# Patient Record
Sex: Female | Born: 1955 | Race: White | Hispanic: No | Marital: Married | Smoking: Never smoker
Health system: Southern US, Community
[De-identification: ages and names within clinical notes are randomized; demographics above are authoritative.]

## PROBLEM LIST (undated history)

## (undated) DIAGNOSIS — C50919 Malignant neoplasm of unspecified site of unspecified female breast: Secondary | ICD-10-CM

## (undated) DIAGNOSIS — I10 Essential (primary) hypertension: Secondary | ICD-10-CM

## (undated) DIAGNOSIS — I499 Cardiac arrhythmia, unspecified: Secondary | ICD-10-CM

## (undated) DIAGNOSIS — D649 Anemia, unspecified: Secondary | ICD-10-CM

## (undated) DIAGNOSIS — Z8742 Personal history of other diseases of the female genital tract: Secondary | ICD-10-CM

## (undated) DIAGNOSIS — K219 Gastro-esophageal reflux disease without esophagitis: Secondary | ICD-10-CM

## (undated) DIAGNOSIS — Z923 Personal history of irradiation: Secondary | ICD-10-CM

## (undated) DIAGNOSIS — F329 Major depressive disorder, single episode, unspecified: Secondary | ICD-10-CM

## (undated) DIAGNOSIS — F32A Depression, unspecified: Secondary | ICD-10-CM

## (undated) DIAGNOSIS — N289 Disorder of kidney and ureter, unspecified: Secondary | ICD-10-CM

## (undated) DIAGNOSIS — R7303 Prediabetes: Secondary | ICD-10-CM

## (undated) DIAGNOSIS — G4730 Sleep apnea, unspecified: Secondary | ICD-10-CM

## (undated) HISTORY — DX: Major depressive disorder, single episode, unspecified: F32.9

## (undated) HISTORY — PX: DIAGNOSTIC LAPAROSCOPY: SUR761

## (undated) HISTORY — PX: TUBAL LIGATION: SHX77

## (undated) HISTORY — DX: Disorder of kidney and ureter, unspecified: N28.9

## (undated) HISTORY — PX: ABDOMINAL HYSTERECTOMY: SHX81

## (undated) HISTORY — DX: Malignant neoplasm of unspecified site of unspecified female breast: C50.919

## (undated) HISTORY — DX: Depression, unspecified: F32.A

---

## 1997-10-18 ENCOUNTER — Other Ambulatory Visit: Admission: RE | Admit: 1997-10-18 | Discharge: 1997-10-18 | Payer: Self-pay | Admitting: Obstetrics and Gynecology

## 1997-11-10 ENCOUNTER — Ambulatory Visit (HOSPITAL_COMMUNITY): Admission: RE | Admit: 1997-11-10 | Discharge: 1997-11-10 | Payer: Self-pay | Admitting: Obstetrics and Gynecology

## 1997-11-16 ENCOUNTER — Ambulatory Visit (HOSPITAL_COMMUNITY): Admission: RE | Admit: 1997-11-16 | Discharge: 1997-11-16 | Payer: Self-pay | Admitting: Obstetrics and Gynecology

## 1998-10-30 ENCOUNTER — Other Ambulatory Visit: Admission: RE | Admit: 1998-10-30 | Discharge: 1998-10-30 | Payer: Self-pay | Admitting: Obstetrics and Gynecology

## 1999-02-28 ENCOUNTER — Encounter (INDEPENDENT_AMBULATORY_CARE_PROVIDER_SITE_OTHER): Payer: Self-pay | Admitting: Specialist

## 1999-02-28 ENCOUNTER — Other Ambulatory Visit: Admission: RE | Admit: 1999-02-28 | Discharge: 1999-02-28 | Payer: Self-pay | Admitting: Obstetrics and Gynecology

## 1999-12-27 ENCOUNTER — Other Ambulatory Visit: Admission: RE | Admit: 1999-12-27 | Discharge: 1999-12-27 | Payer: Self-pay | Admitting: Obstetrics and Gynecology

## 2001-04-06 ENCOUNTER — Ambulatory Visit (HOSPITAL_COMMUNITY): Admission: RE | Admit: 2001-04-06 | Discharge: 2001-04-06 | Payer: Self-pay | Admitting: Obstetrics and Gynecology

## 2001-04-06 ENCOUNTER — Encounter: Payer: Self-pay | Admitting: Obstetrics and Gynecology

## 2003-07-13 ENCOUNTER — Other Ambulatory Visit: Admission: RE | Admit: 2003-07-13 | Discharge: 2003-07-13 | Payer: Self-pay | Admitting: Obstetrics and Gynecology

## 2004-07-22 ENCOUNTER — Other Ambulatory Visit: Admission: RE | Admit: 2004-07-22 | Discharge: 2004-07-22 | Payer: Self-pay | Admitting: Obstetrics and Gynecology

## 2005-07-28 ENCOUNTER — Other Ambulatory Visit: Admission: RE | Admit: 2005-07-28 | Discharge: 2005-07-28 | Payer: Self-pay | Admitting: Obstetrics and Gynecology

## 2006-08-28 ENCOUNTER — Other Ambulatory Visit: Admission: RE | Admit: 2006-08-28 | Discharge: 2006-08-28 | Payer: Self-pay | Admitting: Obstetrics and Gynecology

## 2007-12-07 ENCOUNTER — Other Ambulatory Visit: Admission: RE | Admit: 2007-12-07 | Discharge: 2007-12-07 | Payer: Self-pay | Admitting: Obstetrics and Gynecology

## 2008-05-10 ENCOUNTER — Ambulatory Visit: Payer: Self-pay | Admitting: Unknown Physician Specialty

## 2008-05-10 LAB — HM COLONOSCOPY

## 2008-12-27 ENCOUNTER — Other Ambulatory Visit: Admission: RE | Admit: 2008-12-27 | Discharge: 2008-12-27 | Payer: Self-pay | Admitting: Obstetrics and Gynecology

## 2009-06-30 HISTORY — PX: DILATION AND CURETTAGE OF UTERUS: SHX78

## 2009-10-22 ENCOUNTER — Ambulatory Visit (HOSPITAL_COMMUNITY): Admission: RE | Admit: 2009-10-22 | Discharge: 2009-10-22 | Payer: Self-pay | Admitting: Obstetrics and Gynecology

## 2010-01-21 LAB — HM PAP SMEAR

## 2010-06-27 ENCOUNTER — Ambulatory Visit: Payer: Self-pay | Admitting: Cardiology

## 2010-09-17 LAB — CBC
HCT: 33.2 % — ABNORMAL LOW (ref 36.0–46.0)
Hemoglobin: 11.1 g/dL — ABNORMAL LOW (ref 12.0–15.0)
MCHC: 33.6 g/dL (ref 30.0–36.0)
MCV: 84.2 fL (ref 78.0–100.0)
Platelets: 273 10*3/uL (ref 150–400)
RBC: 3.94 MIL/uL (ref 3.87–5.11)
RDW: 15.6 % — ABNORMAL HIGH (ref 11.5–15.5)
WBC: 6.9 10*3/uL (ref 4.0–10.5)

## 2011-02-11 ENCOUNTER — Encounter (HOSPITAL_COMMUNITY)
Admission: RE | Admit: 2011-02-11 | Discharge: 2011-02-11 | Disposition: A | Discharge status: Home / Self Care | Payer: BC Managed Care – PPO | Source: Ambulatory Visit | Attending: Obstetrics & Gynecology | Admitting: Obstetrics & Gynecology

## 2011-02-11 ENCOUNTER — Encounter (HOSPITAL_COMMUNITY): Payer: Self-pay

## 2011-02-11 HISTORY — DX: Cardiac arrhythmia, unspecified: I49.9

## 2011-02-11 HISTORY — DX: Anemia, unspecified: D64.9

## 2011-02-11 HISTORY — DX: Gastro-esophageal reflux disease without esophagitis: K21.9

## 2011-02-11 HISTORY — DX: Sleep apnea, unspecified: G47.30

## 2011-02-11 LAB — SURGICAL PCR SCREEN
MRSA, PCR: NEGATIVE
Staphylococcus aureus: NEGATIVE

## 2011-02-11 LAB — CBC
HCT: 35.2 % — ABNORMAL LOW (ref 36.0–46.0)
Hemoglobin: 11.5 g/dL — ABNORMAL LOW (ref 12.0–15.0)
MCH: 28.1 pg (ref 26.0–34.0)
MCHC: 32.7 g/dL (ref 30.0–36.0)
MCV: 86.1 fL (ref 78.0–100.0)
Platelets: 236 10*3/uL (ref 150–400)
RBC: 4.09 MIL/uL (ref 3.87–5.11)
RDW: 14.7 % (ref 11.5–15.5)
WBC: 7.2 10*3/uL (ref 4.0–10.5)

## 2011-02-11 NOTE — Patient Instructions (Signed)
20 Kathy Farmer  02/11/2011   Your procedure is scheduled on:  02/17/11  Report to [facility] at 0600 AM.desk phone [phone]  Call this number if you have problems the morning of surgery: [phone]   Remember:   Do not eat food:After Midnight.  Do not drink clear liquids: After Midnight.  Take these medicines the morning of surgery with A SIP OF WATER: antiacid med   Do not wear jewelry, make-up or nail polish.  Do not wear lotions, powders, or perfumes. You may wear deodorant.  Do not shave 48 hours prior to surgery.  Do not bring valuables to the hospital.  Contacts, dentures or bridgework may not be worn into surgery.  Leave suitcase in the car. After surgery it may be brought to your room.  For patients admitted to the hospital, checkout time is 11:00 AM the day of discharge.   Patients discharged the day of surgery will not be allowed to drive home.  Name and phone number of your driver: Theron Arista [phone]  Special Instructions: CHG Shower Use Special Wash: 1/2 bottle night before surgery and 1/2 bottle morning of surgery.   Please read over the following fact sheets that you were given: Care and Recovery After Surgery

## 2011-02-11 NOTE — Anesthesia Preprocedure Evaluation (Signed)
Anesthesia Evaluation  Name, MR# and DOB Patient awake  General Assessment Comment  Reviewed: Allergy & Precautions, H&P , Patient's Chart, lab work & pertinent test results  Airway  TM Distance: >3 FB Neck ROM: Full    Dental  (+) Teeth Intact   Pulmonary  clear to auscultation  pulmonary exam normalPulmonary Exam Normal breath sounds clear to auscultation none    Cardiovascular     Neuro/Psych Negative Neurological ROS  Negative Psych ROS  GI/Hepatic/Renal negative GI ROS, negative Liver ROS, and negative Renal ROS (+)  GERD Controlled and Medicated     Endo/Other  Negative Endocrine ROS (+)      Abdominal Normal abdominal exam  (+)   Musculoskeletal negative musculoskeletal ROS (+)   Hematology negative hematology ROS (+)   Peds  Reproductive/Obstetrics negative OB ROS    Anesthesia Other Findings             Anesthesia Physical Anesthesia Plan  ASA: II  Anesthesia Plan: General   Post-op Pain Management:    Induction: Intravenous  Airway Management Planned: Oral ETT  Additional Equipment:   Intra-op Plan:   Post-operative Plan: Extubation in OR  Informed Consent: I have reviewed the patients History and Physical, chart, labs and discussed the procedure including the risks, benefits and alternatives for the proposed anesthesia with the patient or authorized representative who has indicated his/her understanding and acceptance.   Dental advisory given  Plan Discussed with:   Anesthesia Plan Comments:         Anesthesia Quick Evaluation

## 2011-02-14 DIAGNOSIS — D219 Benign neoplasm of connective and other soft tissue, unspecified: Secondary | ICD-10-CM | POA: Diagnosis present

## 2011-02-14 DIAGNOSIS — N920 Excessive and frequent menstruation with regular cycle: Secondary | ICD-10-CM | POA: Diagnosis present

## 2011-02-14 DIAGNOSIS — N800 Endometriosis of uterus: Secondary | ICD-10-CM

## 2011-02-16 MED ORDER — CEFAZOLIN SODIUM-DEXTROSE 2-3 GM-% IV SOLR
2.0000 g | INTRAVENOUS | Status: DC
  Filled 2011-02-16: qty 50

## 2011-02-17 ENCOUNTER — Encounter (HOSPITAL_COMMUNITY)
Admission: RE | Disposition: A | Discharge status: Home / Self Care | Payer: Self-pay | Source: Ambulatory Visit | Attending: Obstetrics & Gynecology

## 2011-02-17 ENCOUNTER — Other Ambulatory Visit: Payer: Self-pay | Admitting: Obstetrics & Gynecology

## 2011-02-17 ENCOUNTER — Encounter (HOSPITAL_COMMUNITY): Payer: Self-pay | Admitting: Anesthesiology

## 2011-02-17 ENCOUNTER — Ambulatory Visit (HOSPITAL_COMMUNITY)
Admission: RE | Admit: 2011-02-17 | Discharge: 2011-02-17 | Disposition: A | Discharge status: Home / Self Care | Payer: BC Managed Care – PPO | Source: Ambulatory Visit | Attending: Obstetrics & Gynecology | Admitting: Obstetrics & Gynecology

## 2011-02-17 ENCOUNTER — Ambulatory Visit (HOSPITAL_COMMUNITY): Payer: BC Managed Care – PPO | Admitting: Anesthesiology

## 2011-02-17 DIAGNOSIS — D649 Anemia, unspecified: Secondary | ICD-10-CM | POA: Insufficient documentation

## 2011-02-17 DIAGNOSIS — N8000 Endometriosis of the uterus, unspecified: Secondary | ICD-10-CM | POA: Insufficient documentation

## 2011-02-17 DIAGNOSIS — Z01812 Encounter for preprocedural laboratory examination: Secondary | ICD-10-CM | POA: Insufficient documentation

## 2011-02-17 DIAGNOSIS — N920 Excessive and frequent menstruation with regular cycle: Secondary | ICD-10-CM | POA: Diagnosis present

## 2011-02-17 DIAGNOSIS — D250 Submucous leiomyoma of uterus: Secondary | ICD-10-CM | POA: Insufficient documentation

## 2011-02-17 DIAGNOSIS — Z01818 Encounter for other preprocedural examination: Secondary | ICD-10-CM | POA: Insufficient documentation

## 2011-02-17 DIAGNOSIS — D219 Benign neoplasm of connective and other soft tissue, unspecified: Secondary | ICD-10-CM | POA: Diagnosis present

## 2011-02-17 LAB — PREGNANCY, URINE: Preg Test, Ur: NEGATIVE

## 2011-02-17 LAB — CBC
HCT: 35.8 % — ABNORMAL LOW (ref 36.0–46.0)
Hemoglobin: 11.8 g/dL — ABNORMAL LOW (ref 12.0–15.0)
MCH: 28.3 pg (ref 26.0–34.0)
MCHC: 33 g/dL (ref 30.0–36.0)
MCV: 85.9 fL (ref 78.0–100.0)
Platelets: 232 10*3/uL (ref 150–400)
RBC: 4.17 MIL/uL (ref 3.87–5.11)
RDW: 14.6 % (ref 11.5–15.5)
WBC: 11.6 10*3/uL — ABNORMAL HIGH (ref 4.0–10.5)

## 2011-02-17 SURGERY — ROBOTIC ASSISTED TOTAL HYSTERECTOMY
Anesthesia: General | Wound class: Clean Contaminated

## 2011-02-17 MED ORDER — DEXAMETHASONE SODIUM PHOSPHATE 10 MG/ML IJ SOLN
INTRAMUSCULAR | Status: AC
  Filled 2011-02-17: qty 1

## 2011-02-17 MED ORDER — SODIUM CHLORIDE 0.9 % IJ SOLN
3.0000 mL | INTRAMUSCULAR | Status: DC | PRN
  Administered 2011-02-17: 3 mL via INTRAVENOUS

## 2011-02-17 MED ORDER — EPHEDRINE 5 MG/ML INJ
INTRAVENOUS | Status: AC
  Filled 2011-02-17: qty 10

## 2011-02-17 MED ORDER — LIDOCAINE HCL (CARDIAC) 20 MG/ML IV SOLN
INTRAVENOUS | Status: DC | PRN
  Administered 2011-02-17: 60 mg via INTRAVENOUS

## 2011-02-17 MED ORDER — PROPOFOL 10 MG/ML IV EMUL
INTRAVENOUS | Status: DC | PRN
  Administered 2011-02-17: 200 mg via INTRAVENOUS

## 2011-02-17 MED ORDER — GLYCOPYRROLATE 0.2 MG/ML IJ SOLN
INTRAMUSCULAR | Status: AC
  Filled 2011-02-17: qty 1

## 2011-02-17 MED ORDER — MORPHINE SULFATE 4 MG/ML IJ SOLN: 1.0000 mg | INTRAMUSCULAR | Status: DC | PRN

## 2011-02-17 MED ORDER — SCOPOLAMINE 1 MG/3DAYS TD PT72
1.0000 | MEDICATED_PATCH | TRANSDERMAL | Status: DC
  Administered 2011-02-17: 1.5 mg via TRANSDERMAL

## 2011-02-17 MED ORDER — PROPOFOL 10 MG/ML IV EMUL
INTRAVENOUS | Status: AC
  Filled 2011-02-17: qty 20

## 2011-02-17 MED ORDER — NEOSTIGMINE METHYLSULFATE 1 MG/ML IJ SOLN
INTRAMUSCULAR | Status: AC
  Filled 2011-02-17: qty 10

## 2011-02-17 MED ORDER — METOCLOPRAMIDE HCL 5 MG/ML IJ SOLN: 10.0000 mg | Freq: Once | INTRAMUSCULAR | Status: DC | PRN

## 2011-02-17 MED ORDER — CEFAZOLIN SODIUM 1-5 GM-% IV SOLN: 1.0000 g | INTRAVENOUS | Status: DC

## 2011-02-17 MED ORDER — LACTATED RINGERS IV SOLN
INTRAVENOUS | Status: DC
  Administered 2011-02-17 (×2): via INTRAVENOUS

## 2011-02-17 MED ORDER — KETOROLAC TROMETHAMINE 30 MG/ML IJ SOLN: 30.0000 mg | Freq: Four times a day (QID) | INTRAMUSCULAR | Status: DC | PRN

## 2011-02-17 MED ORDER — PROMETHAZINE HCL 25 MG/ML IJ SOLN: 12.5000 mg | INTRAMUSCULAR | Status: DC | PRN

## 2011-02-17 MED ORDER — DEXAMETHASONE SODIUM PHOSPHATE 4 MG/ML IJ SOLN
INTRAMUSCULAR | Status: DC | PRN
  Administered 2011-02-17: 10 mg via INTRAVENOUS

## 2011-02-17 MED ORDER — CEFAZOLIN SODIUM 1-5 GM-% IV SOLN
INTRAVENOUS | Status: DC | PRN
  Administered 2011-02-17: 2 g via INTRAVENOUS

## 2011-02-17 MED ORDER — KETOROLAC TROMETHAMINE 30 MG/ML IJ SOLN
30.0000 mg | Freq: Four times a day (QID) | INTRAMUSCULAR | Status: DC | PRN
  Filled 2011-02-17: qty 1

## 2011-02-17 MED ORDER — GLYCOPYRROLATE 0.2 MG/ML IJ SOLN
INTRAMUSCULAR | Status: DC | PRN
  Administered 2011-02-17: .3 mg via INTRAVENOUS

## 2011-02-17 MED ORDER — ONDANSETRON HCL 4 MG/2ML IJ SOLN
INTRAMUSCULAR | Status: DC | PRN
  Administered 2011-02-17: 4 mg via INTRAVENOUS

## 2011-02-17 MED ORDER — ROCURONIUM BROMIDE 100 MG/10ML IV SOLN
INTRAVENOUS | Status: DC | PRN
  Administered 2011-02-17: 10 mg via INTRAVENOUS
  Administered 2011-02-17: 50 mg via INTRAVENOUS
  Administered 2011-02-17: 20 mg via INTRAVENOUS

## 2011-02-17 MED ORDER — ROCURONIUM BROMIDE 50 MG/5ML IV SOLN
INTRAVENOUS | Status: AC
  Filled 2011-02-17: qty 1

## 2011-02-17 MED ORDER — MIDAZOLAM HCL 5 MG/5ML IJ SOLN
INTRAMUSCULAR | Status: DC | PRN
  Administered 2011-02-17: 2 mg via INTRAVENOUS

## 2011-02-17 MED ORDER — SUFENTANIL CITRATE 50 MCG/ML IV SOLN
INTRAVENOUS | Status: DC | PRN
  Administered 2011-02-17 (×4): 10 ug via INTRAVENOUS

## 2011-02-17 MED ORDER — OXYCODONE-ACETAMINOPHEN 5-325 MG PO TABS: 1.0000 | ORAL_TABLET | ORAL | Status: DC | PRN

## 2011-02-17 MED ORDER — EPHEDRINE SULFATE 50 MG/ML IJ SOLN
INTRAMUSCULAR | Status: DC | PRN
  Administered 2011-02-17 (×2): 5 mg via INTRAVENOUS

## 2011-02-17 MED ORDER — ONDANSETRON HCL 4 MG/2ML IJ SOLN
INTRAMUSCULAR | Status: AC
  Filled 2011-02-17: qty 2

## 2011-02-17 MED ORDER — NEOSTIGMINE METHYLSULFATE 1 MG/ML IJ SOLN
INTRAMUSCULAR | Status: DC | PRN
  Administered 2011-02-17: 4 mg via INTRAMUSCULAR

## 2011-02-17 MED ORDER — ROPIVACAINE HCL 5 MG/ML IJ SOLN
INTRAMUSCULAR | Status: DC | PRN
  Administered 2011-02-17: 84 mL

## 2011-02-17 MED ORDER — LACTATED RINGERS IV SOLN: INTRAVENOUS | Status: DC

## 2011-02-17 MED ORDER — SCOPOLAMINE 1 MG/3DAYS TD PT72: 1.0000 | MEDICATED_PATCH | Freq: Once | TRANSDERMAL | Status: DC

## 2011-02-17 MED ORDER — LACTATED RINGERS IR SOLN
Status: DC | PRN
  Administered 2011-02-17: 1

## 2011-02-17 MED ORDER — FENTANYL CITRATE 0.05 MG/ML IJ SOLN: 25.0000 ug | INTRAMUSCULAR | Status: DC | PRN

## 2011-02-17 MED ORDER — SUFENTANIL CITRATE 50 MCG/ML IV SOLN
INTRAVENOUS | Status: AC
  Filled 2011-02-17: qty 1

## 2011-02-17 MED ORDER — MIDAZOLAM HCL 2 MG/2ML IJ SOLN
INTRAMUSCULAR | Status: AC
  Filled 2011-02-17: qty 2

## 2011-02-17 SURGICAL SUPPLY — 55 items
BARRIER ADHS 3X4 INTERCEED (GAUZE/BANDAGES/DRESSINGS) ×3 IMPLANT
BENZOIN TINCTURE PRP APPL 2/3 (GAUZE/BANDAGES/DRESSINGS) ×3 IMPLANT
BLADELESS LONG 8MM (BLADE) ×3 IMPLANT
CABLE HIGH FREQUENCY MONO STRZ (ELECTRODE) ×3 IMPLANT
CLOTH BEACON ORANGE TIMEOUT ST (SAFETY) ×3 IMPLANT
CONT PATH 16OZ SNAP LID 3702 (MISCELLANEOUS) ×3 IMPLANT
COVER MAYO STAND STRL (DRAPES) ×3 IMPLANT
COVER TABLE BACK 60X90 (DRAPES) ×6 IMPLANT
COVER TIP SHEARS 8 DVNC (MISCELLANEOUS) ×2 IMPLANT
COVER TIP SHEARS 8MM DA VINCI (MISCELLANEOUS) ×1
DECANTER SPIKE VIAL GLASS SM (MISCELLANEOUS) ×3 IMPLANT
DISSECTOR BLUNT TIP ENDO 5MM (MISCELLANEOUS) ×3 IMPLANT
DRAPE HUG U DISPOSABLE (DRAPE) ×3 IMPLANT
DRAPE LG THREE QUARTER DISP (DRAPES) ×6 IMPLANT
DRAPE MONITOR DA VINCI (DRAPE) ×3 IMPLANT
DRAPE UTILITY XL STRL (DRAPES) ×3 IMPLANT
DRAPE WARM FLUID 44X44 (DRAPE) ×3 IMPLANT
ELECT REM PT RETURN 9FT ADLT (ELECTROSURGICAL) ×3
ELECTRODE REM PT RTRN 9FT ADLT (ELECTROSURGICAL) ×2 IMPLANT
EVACUATOR SMOKE 8.L (FILTER) ×3 IMPLANT
GAUZE VASELINE 3X9 (GAUZE/BANDAGES/DRESSINGS) IMPLANT
GLOVE BIOGEL PI IND STRL 7.0 (GLOVE) ×4 IMPLANT
GLOVE BIOGEL PI INDICATOR 7.0 (GLOVE) ×2
GLOVE ECLIPSE 6.5 STRL STRAW (GLOVE) ×9 IMPLANT
GOWN PREVENTION PLUS LG XLONG (DISPOSABLE) ×9 IMPLANT
GOWN STRL REIN XL XLG (GOWN DISPOSABLE) ×3 IMPLANT
KIT DISP ACCESSORY 4 ARM (KITS) ×3 IMPLANT
NEEDLE INSUFFLATION 14GA 120MM (NEEDLE) ×3 IMPLANT
NS IRRIG 1000ML POUR BTL (IV SOLUTION) ×9 IMPLANT
OCCLUDER COLPOPNEUMO (BALLOONS) IMPLANT
PACK LAVH (CUSTOM PROCEDURE TRAY) ×3 IMPLANT
PAD PREP 24X48 CUFFED NSTRL (MISCELLANEOUS) ×6 IMPLANT
SET IRRIG TUBING LAPAROSCOPIC (IRRIGATION / IRRIGATOR) ×3 IMPLANT
SOLUTION ELECTROLUBE (MISCELLANEOUS) ×3 IMPLANT
SPONGE LAP 18X18 X RAY DECT (DISPOSABLE) IMPLANT
STRIP CLOSURE SKIN 1/4X4 (GAUZE/BANDAGES/DRESSINGS) ×9 IMPLANT
SUT VIC AB 0 CT1 27 (SUTURE) ×5
SUT VIC AB 0 CT1 27XBRD ANBCTR (SUTURE) ×10 IMPLANT
SUT VICRYL 0 UR6 27IN ABS (SUTURE) ×3 IMPLANT
SUT VICRYL RAPIDE 4/0 PS 2 (SUTURE) ×6 IMPLANT
SYR 50ML LL SCALE MARK (SYRINGE) ×3 IMPLANT
SYSTEM CONVERTIBLE TROCAR (TROCAR) IMPLANT
TIP UTERINE 5.1X6CM LAV DISP (MISCELLANEOUS) IMPLANT
TIP UTERINE 6.7X10CM GRN DISP (MISCELLANEOUS) IMPLANT
TIP UTERINE 6.7X6CM WHT DISP (MISCELLANEOUS) IMPLANT
TIP UTERINE 6.7X8CM BLUE DISP (MISCELLANEOUS) IMPLANT
TOWEL OR 17X24 6PK STRL BLUE (TOWEL DISPOSABLE) ×6 IMPLANT
TRAY FOLEY BAG SILVER LF 14FR (CATHETERS) ×3 IMPLANT
TROCAR 12MM/512SD (ENDOMECHANICALS) IMPLANT
TROCAR DISP BLADELESS 8 DVNC (TROCAR) ×2 IMPLANT
TROCAR DISP BLADELESS 8MM (TROCAR) ×1
TROCAR XCEL 12X100 BLDLESS (ENDOMECHANICALS) ×3 IMPLANT
TROCAR Z-THREAD 12X150 (TROCAR) ×3 IMPLANT
TUBING FILTER THERMOFLATOR (ELECTROSURGICAL) ×3 IMPLANT
WARMER LAPAROSCOPE (MISCELLANEOUS) ×3 IMPLANT

## 2011-02-17 NOTE — H&P (Signed)
Kathy Farmer is an 55 y.o. female with menorrhagia, failed hysteroscopic fibroid resection, adenomyosis and fibroids here for definitive management.  Pertinent Gynecological History: Menses: heavy and regular Bleeding: as above Contraception: tubal ligation DES exposure: denies Blood transfusions: none Sexually transmitted diseases: no past history Previous GYN Procedures: none  Last mammogram: normal Date: 2011 Last pap: normal Date: July 2011 OB History: G2, P2   Menstrual History: Menarche age: 89 Patient's last menstrual period was 01/24/2011.    Past Medical History  Diagnosis Date  . GERD (gastroesophageal reflux disease)   . Dysrhythmia     racing heart- neg workup  . Sleep apnea     inconclusive sleep study  . Anemia     Past Surgical History  Procedure Date  . Dilation and curettage of uterus 2011    with hysteroecopy  . Cesarean section   . Diagnostic laparoscopy     No family history on file.  Social History:  reports that she has never smoked. She does not have any smokeless tobacco history on file. She reports that she does not use illicit drugs. Her alcohol history not on file.  Allergies: No Known Allergies  Prescriptions prior to admission  Medication Sig Dispense Refill  . calcium-vitamin D (OSCAL WITH D) 500-200 MG-UNIT per tablet Take 1 tablet by mouth 2 (two) times daily.        . Multiple Vitamins-Minerals (MULTIVITAMIN WITH MINERALS) tablet Take 1 tablet by mouth daily.        Marland Kitchen omeprazole (PRILOSEC) 20 MG capsule Take 20 mg by mouth daily as needed. For heartburn         Review of Systems  Constitutional: Negative for fever and chills.  Respiratory: Negative for cough and shortness of breath.   Cardiovascular: Negative for chest pain and palpitations.  Gastrointestinal: Negative for heartburn, nausea, vomiting and abdominal pain.  Genitourinary: Negative for dysuria.  Musculoskeletal: Negative for myalgias.  Skin: Negative for rash.   Neurological: Negative for dizziness and headaches.  Endo/Heme/Allergies: Does not bruise/bleed easily.    Blood pressure 160/81, pulse 97, temperature 98.2 F (36.8 C), temperature source Oral, resp. rate 18, last menstrual period 01/24/2011, SpO2 98.00%. Physical Exam  Constitutional: She is oriented to person, place, and time. She appears well-developed and well-nourished.  HENT:  Head: Normocephalic and atraumatic.  Neck: Normal range of motion. Neck supple. No thyromegaly present.  Cardiovascular: Normal rate, regular rhythm and normal heart sounds.   Respiratory: Effort normal and breath sounds normal.  GI: Soft. Bowel sounds are normal. She exhibits no distension. There is no tenderness.  Genitourinary: Vagina normal.  Musculoskeletal: Normal range of motion.  Neurological: She is oriented to person, place, and time.  Skin: Skin is warm and dry.  Psychiatric: She has a normal mood and affect.    Results for orders placed during the hospital encounter of 02/17/11 (from the past 24 hour(s))  PREGNANCY, URINE     Status: Normal   Collection Time   02/17/11  6:30 AM      Component Value Range   Preg Test, Ur NEGATIVE      No results found.  Assessment/Plan: Very nice female here for robotic assisted TLH, BSO.  I have confirmed this again this morning--desire for ovary removal.  Risks and benefits all discussed in my office and are documented in patient's chart.   Kathy Farmer,M SUZANNE 02/17/2011, 7:00 AM

## 2011-02-17 NOTE — Anesthesia Postprocedure Evaluation (Signed)
Anesthesia Post Note  Patient: Kathy Farmer  Procedure(s) Performed:  ROBOTIC ASSISTED TOTAL HYSTERECTOMY - Robotic Assisted Total Hysterectomy with Bilateral Salpingo-Oophorectomies  Anesthesia type: General  Patient location: PACU  Post pain: Pain level controlled  Post assessment: Post-op Vital signs reviewed  Last Vitals:  Filed Vitals:   02/17/11 1100  BP: 133/71  Pulse: 74  Temp: 98.3 F (36.8 C)  Resp:     Post vital signs: Reviewed  Level of consciousness: sedated  Complications: No apparent anesthesia complications

## 2011-02-17 NOTE — Op Note (Signed)
02/17/2011  10:55 AM  PATIENT:  Kathy Farmer  55 y.o. female, G2P2 with menorrhagia, adenomyosis, fibroids, anemia, h/o cesarean section x 1, h/o NSVD x 1  PRE-OPERATIVE DIAGNOSIS:  Menorrhagia; Possible Adenomyosis  POST-OPERATIVE DIAGNOSIS:  Menorrhagia; Possible Adenomyosis  PROCEDURE:  Procedure(s): ROBOTIC ASSISTED TOTAL HYSTERECTOMY/BILATERAL SALPINGOOPHORECTOMY  SURGEON:  Julyanna Scholle,M SUZANNE  ASSISTANTS: Meredeth Ide, MD   ANESTHESIA:   general  ESTIMATED BLOOD LOSS: * No blood loss amount entered *  BLOOD ADMINISTERED:none   FLUIDS: 900ccLR  UOP: 50cc concentrated  SPECIMEN:  Uterus, cervix, tubes, and ovaries  DISPOSITION OF SPECIMEN:  PATHOLOGY  FINDINGS: adhesions along lower uterine segment and cervix from prior cesarean section, normal upper abdomen, normal appendix, no endometriosis  INDICATIONS:  The patient is a 55 year old G2P2 married white female with history of menorrhagia, fibroids, adenomyosis, and anemia. Patient underwent a hysteroscopic fibroid resection in April earlier this year. She has continued to have heavy menstrual cycles with clots and a hemoglobin in the 11 range, despite iron supplementation. She and I have discussed treatment options including oral contraceptive pills, Mirena IUD, and definitive management with hysterectomy. The patient does have evidence of adenomyosis on her ultrasound and did have a normal endometrial biopsy in June of this year. I have not recommended her considering endometrial ablation for treatment because of increased failure rate with adenomyosis. She has discussed this with her husband and has decided to proceed with definitive management.  DESCRIPTION OF OPERATION: Patient was admitted to same-day surgery and taken to the operating room where she was initially placed in the supine position. Her arms were tucked on the side. She was comfortable before general endotracheal anesthesia was administered by the  anesthesia staff. Legs are positioned in the low lithotomy position in Eldora stirrups. General endotracheal anesthesia was administered without difficulty. The patient was on a beanbag. This was brought up around her shoulders and the air removed from the bean bag to allow firm, tight, snug fit around the neck and shoulders. Again her hands and fingers were inspected they were nice and pink without any atypical pressure.  SCDs were on the lower extremities and functioning.  Time out was performed.  The abdomen was prepped with chlor prep and the perineum, inner thighs, and vagina were prepped with Betadine prep. After 3 minutes had past, the patient was draped in a normal sterile fashion. Legs are lifted to the high lithotomy position. A heavy weighted speculum was placed in the posterior aspect of the vagina and a curved Deaver was placed in the anterior vagina. The cervix was visualized and grasped with single-tooth tenaculum. The uterus sounded to 8 cm. A RUMI uterine manipulator was obtained. An 8 cm disposable tip is attached to the manipulator and a medium KOH ring was then attached to the manipulator is well. A vaginal occlusive device was already on the instrument. The cervix was dilated up to a #23 with a Geneticist, molecular. 10 cc of 0.5% ropivacaine mixed one-to-one with normal saline was instilled the top of the vagina.  Just before this was done, the KOH ring was initially pressed to create a ridge that would be the probable location of the vaginal cuff after the hysterectomy was completed. The injection was done at the 2, 4, 8 and 10:00 positions.  The RUMI uterine manipulator was then passed through the cervix and a 10 cc balloon was inflated. There was a good fit around the cervix with the KOH ring and the uterus manipulated easily. A heavy  weighted speculum and curved Deaver were then removed from the vagina. A Foley catheter was inserted in the bladder under sterile conditions to straight drain.  Legs  were lowered to the low lithotomy position with the legs in a 180 degrees angle with the abdomen.  Attention was turned to the abdomen. The ropivacaine and normal saline mixture was used to anesthetize above the umbilicus. A #11 blade was used to make a 10 mm skin incision. The subcutaneous fat and tissue was dissected. The abdomen was elevated and a Veress needle was inserted into the abdomen. A syringe of normal saline was obtained. This was attached to the Veress needle. An aspiration was performed without any blood or fluid being noted. Saline was injected easily into the abdomen a repeat aspiration was performed without any blood or fluid being noted. Then the fluid dripped easily down the Veress needle. CO2 under low flow was attached 3 L of CO2 gas was used to insufflate the abdomen without difficulty. This was all done under pressures of 10 mm mercury.  Then a #10 disposable bladed trocar was obtained. The abdomen elevated this was inserted into the abdomen without difficulty. The laparoscope was used to confirm correct placement. The upper and lower abdomen was surveyed. The liver, falciform ligament, stomach edge, appendix and uterus, cervix, tubes and ovaries all appeared normal. There was no evidence of endometriosis. There was scarring and adhesions of the bladder to the lower portion of the uterus and cervix from her prior cesarean section. Otherwise the pelvis did appear normal.  The ureters were noted peristalsing bilaterally.  60cc of the Ropivacaine mixture was placed in the pelvis.    Port locations were then chosen. With transillumination of the abdominal wall, a port location was chosen 10 mm right and left of the umbilical incision. A 12 mm port location was chosen in the right lower quadrant. This would be the assistant's port. The skin was anesthetized with ropivacaine mixture. 8mm incisions were made with a #11 blade at the ports right and left of the umbilicus.  These would be called  ports 1 in 2, respectively. A 10 mm skin incision was made in the right lower quadrant. Under direct visualization from the laparoscope, the 8mm nondisposable ports and trochars were placed in the #1 and #2 port site and a 12 mm disposable port with trochar was placed in the right lower quadrant. The trochars were removed. At this point the OR table was placed on the floor and the patient was placed in Trendelenburg positioning.  This was done just until the bowels lifted up out of the pelvis. Then the robot was side docked in a normal standard fashion. In the #1 arm a monopolar scissor was placed and in the #2 arm a bipolar PK [place] was placed.  With uterus on stretch to the left, the right IP ligament was serially cauterized and incised until it was completely traversed. The right round ligament was serially cauterized and incised. The anterior and posterior leaf of the broad ligament were opened.  The posterior leaf was opened down to the level of the uterosacral ligament on the right. The anterior leaf of the broad ligament was incised down to the level of the internal os of the cervix in the midline. There were adhesions of the bladder flap from the prior cesarean section.  These were taken down very carefully with sharp and blunt dissection. Once the white tissue of the cervix was noted the bladder flap was then  pushed down the cervix below the level of the KOH ring. The right uterine artery was skeletonized at this point above the level of the KOH ring.  It was then clamped, cauterized with bipolar cauter and finally incised.    Attention was turned to the left. In a similar fashion the uterus placed was placed on stretch to the right. The left IP ligament was serially clamped and cauterized.  It was then cut until this ligament was completely traversed. The left round ligament was serially clamped, cauterized, and incised. The anterior and posterior leaf of the broad ligament were separated. The  posterior leaf was opened down to the level of the uterosacral ligament on the left. The anterior lip of the broad ligament was opened down towards level of the internal os of the cervix. This connected with the prior incision made anteriorly on the other side. The creation of the bladder flap was completed and the bladder was, at this point, below the level of the KOH ring.  Then the left uterine artery was skeletonized.  Above the KOH ring, it was serially clamped cauterized and then incised. At this point the uterus was completely devascularized from uterine arteries.  The uterus became purple and white. Then starting at the midline of the cervix and using the monopolar scissors with one end open, the colpotomy was made. This was carried around the cervix in a clockwise fashion. Once the colpotomy was completed and the cervix and uterus were free from the vagina, the specimen with tubes and ovaries were delivered to the vagina.  The uterus was then used to occlude the vagina for the remainder of the case.  The instruments in ports #1 and #2 were changed to a needle cut suture driver, in port 1, and a Prograsp, in port 2. 3 figure-of-eight sutures were used to close the vaginal with a #0 Vicryl suture.  The first stitch was placed at the left corner.  The second was placed at the right corner and the third was placed in the midline.  The anterior and posterior vaginal mucosa was incorporated into this stitch.  The cuff was closed well at this point and there was no bleeding noted. Normal saline was used to irrigate the pelvis. There was no bleeding noted. Ureter peristalsis was noted bilaterally. All pedicles were inspected and no bleeding was noted. The pelvis was irrigated and the irrigant removed.  The pneumoperitoneum was relieved and also again no bleeding was noted. Before the instruments were removed an Interceed was placed across the vaginal cuff. At this point the procedure was ended.  The specimen  was removed from the vagina. The instruments were removed and the robot was undocked. The patient was taken out of Trendelenburg positioning. Under direct visualization of the laparoscope the right lower quadrant port and the #1 and #2 ports were removed. The pneumoperitoneum was relieved. The CRNA give the patient several deep breaths to help give any gas out of the abdomen. Then the midline port was removed.  The right lower quadrant port and the midline port were closed at the fascial level with figure-of-eight suture of #0 Vicryl. The skin was closed with subcuticular stitched of 3-0 Vicryl. The skin was cleansed and Dermabond was applied. The legs are positioned back in the supine position. Sponge lap needle and tissue encounter correct x2. Patient was awake from anesthesia without difficulty. She was taken to recovery in stable condition.  COUNTS:  YES  PLAN OF CARE: Transfer to PACU

## 2011-02-17 NOTE — Anesthesia Procedure Notes (Signed)
Procedures

## 2011-02-17 NOTE — Transfer of Care (Signed)
Immediate Anesthesia Transfer of Care Note  Patient: Kathy Farmer  Procedure(s) Performed:  ROBOTIC ASSISTED TOTAL HYSTERECTOMY - Robotic Assisted Total Hysterectomy with Bilateral Salpingo-Oophorectomies  Patient Location: PACU  Anesthesia Type: General  Level of Consciousness: alert , oriented and sedated  Airway & Oxygen Therapy: Patient Spontanous Breathing and Patient connected to nasal cannula oxygen  Post-op Assessment: Report given to PACU RN and Post -op Vital signs reviewed and stable  Post vital signs: Reviewed and stable  Complications: No apparent anesthesia complications

## 2011-02-17 NOTE — Plan of Care (Signed)
Problem: Discharge Progression Outcomes Goal: Other Discharge Outcomes/Goals Outcome: Completed/Met Date Met:  02/17/11 Pt d/c without problems  Husband with pt

## 2012-01-13 LAB — HM MAMMOGRAPHY: HM Mammogram: NEGATIVE

## 2012-08-11 LAB — HM HEPATITIS C SCREENING LAB: HM Hepatitis Screen: NEGATIVE

## 2012-11-24 ENCOUNTER — Encounter: Payer: Self-pay | Admitting: Obstetrics & Gynecology

## 2012-11-25 ENCOUNTER — Ambulatory Visit: Payer: Self-pay | Admitting: Obstetrics & Gynecology

## 2012-11-25 DIAGNOSIS — Z01419 Encounter for gynecological examination (general) (routine) without abnormal findings: Secondary | ICD-10-CM

## 2012-11-26 ENCOUNTER — Encounter: Payer: Self-pay | Admitting: Obstetrics & Gynecology

## 2013-03-09 ENCOUNTER — Ambulatory Visit: Payer: Self-pay | Admitting: Podiatry

## 2014-03-16 DIAGNOSIS — R635 Abnormal weight gain: Secondary | ICD-10-CM | POA: Insufficient documentation

## 2014-03-16 DIAGNOSIS — E282 Polycystic ovarian syndrome: Secondary | ICD-10-CM | POA: Insufficient documentation

## 2014-05-01 ENCOUNTER — Encounter: Payer: Self-pay | Admitting: Obstetrics & Gynecology

## 2014-07-03 DIAGNOSIS — M76829 Posterior tibial tendinitis, unspecified leg: Secondary | ICD-10-CM | POA: Insufficient documentation

## 2014-07-03 HISTORY — DX: Posterior tibial tendinitis, unspecified leg: M76.829

## 2014-07-12 ENCOUNTER — Ambulatory Visit: Payer: Self-pay | Admitting: Surgery

## 2014-07-27 DIAGNOSIS — S43003A Unspecified subluxation of unspecified shoulder joint, initial encounter: Secondary | ICD-10-CM | POA: Insufficient documentation

## 2014-07-27 DIAGNOSIS — M65819 Other synovitis and tenosynovitis, unspecified shoulder: Secondary | ICD-10-CM | POA: Insufficient documentation

## 2014-07-27 DIAGNOSIS — M65919 Unspecified synovitis and tenosynovitis, unspecified shoulder: Secondary | ICD-10-CM | POA: Insufficient documentation

## 2014-07-27 DIAGNOSIS — S42143A Displaced fracture of glenoid cavity of scapula, unspecified shoulder, initial encounter for closed fracture: Secondary | ICD-10-CM

## 2014-07-27 DIAGNOSIS — M7540 Impingement syndrome of unspecified shoulder: Secondary | ICD-10-CM | POA: Insufficient documentation

## 2014-07-27 HISTORY — DX: Displaced fracture of glenoid cavity of scapula, unspecified shoulder, initial encounter for closed fracture: S42.143A

## 2014-12-13 ENCOUNTER — Encounter: Payer: Self-pay | Admitting: Physician Assistant

## 2014-12-15 ENCOUNTER — Ambulatory Visit (INDEPENDENT_AMBULATORY_CARE_PROVIDER_SITE_OTHER): Payer: BLUE CROSS/BLUE SHIELD | Admitting: Physician Assistant

## 2014-12-15 ENCOUNTER — Encounter: Payer: Self-pay | Admitting: Physician Assistant

## 2014-12-15 ENCOUNTER — Other Ambulatory Visit: Payer: Self-pay | Admitting: Physician Assistant

## 2014-12-15 VITALS — BP 136/90 | HR 62 | Temp 98.0°F | Resp 16 | Ht 69.25 in | Wt 213.6 lb

## 2014-12-15 DIAGNOSIS — R5383 Other fatigue: Secondary | ICD-10-CM

## 2014-12-15 DIAGNOSIS — K055 Other periodontal diseases: Secondary | ICD-10-CM

## 2014-12-15 DIAGNOSIS — G4762 Sleep related leg cramps: Secondary | ICD-10-CM | POA: Diagnosis not present

## 2014-12-15 DIAGNOSIS — I7300 Raynaud's syndrome without gangrene: Secondary | ICD-10-CM | POA: Insufficient documentation

## 2014-12-15 DIAGNOSIS — Z0000 Encounter for general adult medical examination without abnormal findings: Secondary | ICD-10-CM

## 2014-12-15 DIAGNOSIS — K068 Other specified disorders of gingiva and edentulous alveolar ridge: Secondary | ICD-10-CM

## 2014-12-15 NOTE — Progress Notes (Signed)
Patient ID: Kathy Farmer, female   DOB: 1956/05/24, 59 y.o.   MRN: [id number]  Patient: Kathy Farmer, Female    DOB: [birth year], 59 y.o.   MRN: [id number] Visit Date: 12/15/2014  Today's Provider: Mar Daring, PA-C   Chief Complaint  Patient presents with  . Annual Exam   Subjective:    Annual physical exam Kathy Farmer is a 59 y.o. female who presents today for health maintenance and complete physical. She feels fairly well. She reports not exercising. She reports she is sleeping poorly.  Can fall asleep ok, but wakes up around 1:30-2:00 a.m. and has difficulty falling back asleep.  Bleeding Gums:  Started 2-3 weeks ago.  No change in diet, appetite.  Denies any other areas of bleeding or bruising easily.  Followed regularly by a dentist and reports never having gingivitis.  Leg Cramps:  New onset 2-3 weeks ago.  Wakes her in the middle of the night and she has to stand to "walk the cramp out."  Feels she is drinking enough water.  No other changes.  Denies tingling, numbness, "creepy-crawly" sensations.  Fingers Turning Blue:  Happens about once per week.  Was worse this past winter.  When fingers change colors she states she does have a painful burning sensation in her fingers.  It lasts for less than one minute and then will subside.  She has not equated it only occurring in cold environments but states it does occur when there is any temperature change either way (going from hot to cold or vice versa).  -----------------------------------------------------------------   Review of Systems  Constitutional: Positive for fatigue. Negative for fever, chills, diaphoresis, activity change, appetite change and unexpected weight change.  HENT: Negative.   Eyes: Negative.   Respiratory: Negative.   Cardiovascular: Negative.   Gastrointestinal: Negative.   Endocrine: Negative.   Genitourinary: Negative.   Musculoskeletal: Positive for myalgias  and arthralgias. Negative for back pain, joint swelling and gait problem.  Skin: Positive for color change (fingers turn blue with temperature change).  Allergic/Immunologic: Negative.   Neurological: Negative.   Hematological: Bruises/bleeds easily (gums bleeding with tooth brushing).  Psychiatric/Behavioral: Negative.     Social History She  reports that she has never smoked. She does not have any smokeless tobacco history on file. She reports that she drinks alcohol. She reports that she does not use illicit drugs.  Patient Active Problem List   Diagnosis Date Noted  . Infraspinatus tenosynovitis 07/27/2014  . Impingement syndrome of shoulder 07/27/2014  . Closed fracture of glenoid cavity of scapula 07/27/2014  . Shoulder subluxation 07/27/2014  . Posterior tibial tendinitis 07/03/2014  . Abnormal weight gain 03/16/2014  . Bilateral polycystic ovarian syndrome 03/16/2014    Past Surgical History  Procedure Laterality Date  . Dilation and curettage of uterus  2011    with hysteroecopy  . Cesarean section    . Diagnostic laparoscopy    . Tubal ligation Bilateral   . Abdominal hysterectomy      Family History Her family history includes Breast cancer (age of onset: 40) in her mother; Diabetes in her father; Hypertension in her father; Osteoporosis in her mother.    Previous Medications   METFORMIN (GLUCOPHAGE) 500 MG TABLET    Take 1 tablet by mouth daily.   MULTIPLE VITAMINS-MINERALS (MULTIVITAMIN WITH MINERALS) TABLET    Take 1 tablet by mouth daily.     VITAMIN B-12 (CYANOCOBALAMIN) 1000 MCG TABLET    Take 1,000 mcg by mouth  2 (two) times daily.    Patient Care Team: Jerrol Banana., MD as PCP - General (Family Medicine)     Objective:   Vitals: BP 136/90 mmHg  Pulse 62  Temp(Src) 98 F (36.7 C) (Oral)  Resp 16  Ht 5' 9.25" (1.759 m)  Wt 213 lb 9.6 oz (96.888 kg)  BMI 31.31 kg/m2  LMP 01/24/2011   Physical Exam  Constitutional: She is oriented to  person, place, and time. She appears well-developed and well-nourished. No distress.  HENT:  Head: Normocephalic and atraumatic.  Right Ear: Hearing, tympanic membrane, external ear and ear canal normal.  Left Ear: Hearing, tympanic membrane, external ear and ear canal normal.  Nose: Nose normal. Right sinus exhibits no maxillary sinus tenderness and no frontal sinus tenderness. Left sinus exhibits no maxillary sinus tenderness and no frontal sinus tenderness.  Mouth/Throat: Uvula is midline, oropharynx is clear and moist and mucous membranes are normal. No oral lesions. No lacerations or dental caries. No oropharyngeal exudate.  Eyes: Conjunctivae and EOM are normal. Pupils are equal, round, and reactive to light. Right eye exhibits no discharge. Left eye exhibits no discharge. No scleral icterus.  Neck: Normal range of motion. Neck supple. No JVD present. Carotid bruit is not present. No tracheal deviation present. No thyromegaly present.  Cardiovascular: Normal rate, regular rhythm, normal heart sounds and intact distal pulses.  Exam reveals no gallop and no friction rub.   No murmur heard. Pulmonary/Chest: Effort normal and breath sounds normal. No respiratory distress. She has no wheezes. She has no rales. She exhibits no tenderness.  Abdominal: Soft. Bowel sounds are normal. She exhibits no distension and no mass. There is no tenderness. There is no rebound and no guarding.  Genitourinary:  deferred  Musculoskeletal: Normal range of motion. She exhibits no edema or tenderness.  Lymphadenopathy:    She has no cervical adenopathy.  Neurological: She is alert and oriented to person, place, and time. She has normal reflexes. No cranial nerve deficit. Coordination normal.  Skin: Skin is warm and dry. No rash noted. She is not diaphoretic. No erythema. No pallor.  Psychiatric: She has a normal mood and affect. Her behavior is normal. Judgment and thought content normal.  Vitals  reviewed.    Depression Screen PHQ 2/9 Scores 12/15/2014  PHQ - 2 Score 2  PHQ- 9 Score 9      Assessment & Plan:     Routine Health Maintenance and Physical Exam  Exercise Activities and Dietary recommendations Goals    None      Immunization History  Administered Date(s) Administered  . Tdap 07/01/2007    Health Maintenance  Topic Date Due  . HIV Screening  08/11/1971  . COLONOSCOPY  08/10/2006  . PAP SMEAR  01/21/2013  . MAMMOGRAM  01/12/2014  . INFLUENZA VACCINE  01/29/2015  . TETANUS/TDAP  06/30/2017      Discussed health benefits of physical activity, and encouraged her to engage in regular exercise appropriate for her age and condition.   1. Routine general medical examination at a health care facility - Mammogram Digital Screening; Future - Lipid panel  2. Raynaud's phenomenon Discussed possible treatment with [facility].  She refuses at this point and will treat conservatively with keeping her hands warm and covering them when she goes outside, especially in colder temperatures.  3. Leg cramps, sleep related Will check labs.  Advised to stay well hydrated.  F/U pending labs. - Comprehensive metabolic panel  4. Bleeding gums Reports  seeing a dentist regularly and has always been given good report without ever a diagnosis of gingivitis.  Will check labs to R/O anemia. - CBC with Differential  5. Other fatigue Will check labs.  F/U pending labs. - T4, free - TSH - T3     --------------------------------------------------------------------

## 2014-12-15 NOTE — Patient Instructions (Signed)
Health Maintenance Adopting a healthy lifestyle and getting preventive care can go a long way to promote health and wellness. Talk with your health care provider about what schedule of regular examinations is right for you. This is a good chance for you to check in with your provider about disease prevention and staying healthy. In between checkups, there are plenty of things you can do on your own. Experts have done a lot of research about which lifestyle changes and preventive measures are most likely to keep you healthy. Ask your health care provider for more information. WEIGHT AND DIET  Eat a healthy diet 1. Be sure to include plenty of vegetables, fruits, low-fat dairy products, and lean protein. 2. Do not eat a lot of foods high in solid fats, added sugars, or salt. 3. Get regular exercise. This is one of the most important things you can do for your health. 1. Most adults should exercise for at least 150 minutes each week. The exercise should increase your heart rate and make you sweat (moderate-intensity exercise). 2. Most adults should also do strengthening exercises at least twice a week. This is in addition to the moderate-intensity exercise.  Maintain a healthy weight 1. Body mass index (BMI) is a measurement that can be used to identify possible weight problems. It estimates body fat based on height and weight. Your health care provider can help determine your BMI and help you achieve or maintain a healthy weight. 2. For females 6 years of age and older:  1. A BMI below 18.5 is considered underweight. 2. A BMI of 18.5 to 24.9 is normal. 3. A BMI of 25 to 29.9 is considered overweight. 4. A BMI of 30 and above is considered obese.  Watch levels of cholesterol and blood lipids 1. You should start having your blood tested for lipids and cholesterol at 59 years of age, then have this test every 5 years. 2. You may need to have your cholesterol levels checked more often if: 1. Your  lipid or cholesterol levels are high. 2. You are older than 58 years of age. 3. You are at high risk for heart disease.  CANCER SCREENING   Lung Cancer 1. Lung cancer screening is recommended for adults 7-87 years old who are at high risk for lung cancer because of a history of smoking. 2. A yearly low-dose CT scan of the lungs is recommended for people who: 1. Currently smoke. 2. Have quit within the past 15 years. 3. Have at least a 30-pack-year history of smoking. A pack year is smoking an average of one pack of cigarettes a day for 1 year. 3. Yearly screening should continue until it has been 15 years since you quit. 4. Yearly screening should stop if you develop a health problem that would prevent you from having lung cancer treatment.  Breast Cancer  Practice breast self-awareness. This means understanding how your breasts normally appear and feel.  It also means doing regular breast self-exams. Let your health care provider know about any changes, no matter how small.  If you are in your 20s or 30s, you should have a clinical breast exam (CBE) by a health care provider every 1-3 years as part of a regular health exam.  If you are 30 or older, have a CBE every year. Also consider having a breast X-Madlock (mammogram) every year.  If you have a family history of breast cancer, talk to your health care provider about genetic screening.  If you are  at high risk for breast cancer, talk to your health care provider about having an MRI and a mammogram every year.  Breast cancer gene (BRCA) assessment is recommended for women who have family members with BRCA-related cancers. BRCA-related cancers include:  Breast.  Ovarian.  Tubal.  Peritoneal cancers.  Results of the assessment will determine the need for genetic counseling and BRCA1 and BRCA2 testing. Cervical Cancer Routine pelvic examinations to screen for cervical cancer are no longer recommended for nonpregnant women who  are considered low risk for cancer of the pelvic organs (ovaries, uterus, and vagina) and who do not have symptoms. A pelvic examination may be necessary if you have symptoms including those associated with pelvic infections. Ask your health care provider if a screening pelvic exam is right for you.   The Pap test is the screening test for cervical cancer for women who are considered at risk.  If you had a hysterectomy for a problem that was not cancer or a condition that could lead to cancer, then you no longer need Pap tests.  If you are older than 65 years, and you have had normal Pap tests for the past 10 years, you no longer need to have Pap tests.  If you have had past treatment for cervical cancer or a condition that could lead to cancer, you need Pap tests and screening for cancer for at least 20 years after your treatment.  If you no longer get a Pap test, assess your risk factors if they change (such as having a new sexual partner). This can affect whether you should start being screened again.  Some women have medical problems that increase their chance of getting cervical cancer. If this is the case for you, your health care provider may recommend more frequent screening and Pap tests.  The human papillomavirus (HPV) test is another test that may be used for cervical cancer screening. The HPV test looks for the virus that can cause cell changes in the cervix. The cells collected during the Pap test can be tested for HPV.  The HPV test can be used to screen women 2 years of age and older. Getting tested for HPV can extend the interval between normal Pap tests from three to five years.  An HPV test also should be used to screen women of any age who have unclear Pap test results.  After 58 years of age, women should have HPV testing as often as Pap tests.  Colorectal Cancer  This type of cancer can be detected and often prevented.  Routine colorectal cancer screening usually  begins at 58 years of age and continues through 58 years of age.  Your health care provider may recommend screening at an earlier age if you have risk factors for colon cancer.  Your health care provider may also recommend using home test kits to check for hidden blood in the stool.  A small camera at the end of a tube can be used to examine your colon directly (sigmoidoscopy or colonoscopy). This is done to check for the earliest forms of colorectal cancer.  Routine screening usually begins at age 57.  Direct examination of the colon should be repeated every 5-10 years through 58 years of age. However, you may need to be screened more often if early forms of precancerous polyps or small growths are found. Skin Cancer  Check your skin from head to toe regularly.  Tell your health care provider about any new moles or changes in  moles, especially if there is a change in a mole's shape or color.  Also tell your health care provider if you have a mole that is larger than the size of a pencil eraser.  Always use sunscreen. Apply sunscreen liberally and repeatedly throughout the day.  Protect yourself by wearing long sleeves, pants, a wide-brimmed hat, and sunglasses whenever you are outside. HEART DISEASE, DIABETES, AND HIGH BLOOD PRESSURE   Have your blood pressure checked at least every 1-2 years. High blood pressure causes heart disease and increases the risk of stroke.  If you are between 32 years and 30 years old, ask your health care provider if you should take aspirin to prevent strokes.  Have regular diabetes screenings. This involves taking a blood sample to check your fasting blood sugar level.  If you are at a normal weight and have a low risk for diabetes, have this test once every three years after 58 years of age.  If you are overweight and have a high risk for diabetes, consider being tested at a younger age or more often. PREVENTING INFECTION  Hepatitis B  If you have a  higher risk for hepatitis B, you should be screened for this virus. You are considered at high risk for hepatitis B if:  You were born in a country where hepatitis B is common. Ask your health care provider which countries are considered high risk.  Your parents were born in a high-risk country, and you have not been immunized against hepatitis B (hepatitis B vaccine).  You have HIV or AIDS.  You use needles to inject street drugs.  You live with someone who has hepatitis B.  You have had sex with someone who has hepatitis B.  You get hemodialysis treatment.  You take certain medicines for conditions, including cancer, organ transplantation, and autoimmune conditions. Hepatitis C  Blood testing is recommended for:  Everyone born from [birth year] through 1964.  Anyone with known risk factors for hepatitis C. Sexually transmitted infections (STIs)  You should be screened for sexually transmitted infections (STIs) including gonorrhea and chlamydia if:  You are sexually active and are younger than 58 years of age.  You are older than 58 years of age and your health care provider tells you that you are at risk for this type of infection.  Your sexual activity has changed since you were last screened and you are at an increased risk for chlamydia or gonorrhea. Ask your health care provider if you are at risk.  If you do not have HIV, but are at risk, it may be recommended that you take a prescription medicine daily to prevent HIV infection. This is called pre-exposure prophylaxis (PrEP). You are considered at risk if:  You are sexually active and do not regularly use condoms or know the HIV status of your partner(s).  You take drugs by injection.  You are sexually active with a partner who has HIV. Talk with your health care provider about whether you are at high risk of being infected with HIV. If you choose to begin PrEP, you should first be tested for HIV. You should then be tested  every 3 months for as long as you are taking PrEP.  PREGNANCY   If you are premenopausal and you may become pregnant, ask your health care provider about preconception counseling.  If you may become pregnant, take 400 to 800 micrograms (mcg) of folic acid every day.  If you want to prevent pregnancy, talk to your  health care provider about birth control (contraception). OSTEOPOROSIS AND MENOPAUSE   Osteoporosis is a disease in which the bones lose minerals and strength with aging. This can result in serious bone fractures. Your risk for osteoporosis can be identified using a bone density scan.  If you are 76 years of age or older, or if you are at risk for osteoporosis and fractures, ask your health care provider if you should be screened.  Ask your health care provider whether you should take a calcium or vitamin D supplement to lower your risk for osteoporosis.  Menopause may have certain physical symptoms and risks.  Hormone replacement therapy may reduce some of these symptoms and risks. Talk to your health care provider about whether hormone replacement therapy is right for you.  HOME CARE INSTRUCTIONS   Schedule regular health, dental, and eye exams.  Stay current with your immunizations.   Do not use any tobacco products including cigarettes, chewing tobacco, or electronic cigarettes.  If you are pregnant, do not drink alcohol.  If you are breastfeeding, limit how much and how often you drink alcohol.  Limit alcohol intake to no more than 1 drink per day for nonpregnant women. One drink equals 12 ounces of beer, 5 ounces of wine, or 1 ounces of hard liquor.  Do not use street drugs.  Do not share needles.  Ask your health care provider for help if you need support or information about quitting drugs.  Tell your health care provider if you often feel depressed.  Tell your health care provider if you have ever been abused or do not feel safe at home. Document  Released: 12/30/2010 Document Revised: 10/31/2013 Document Reviewed: 05/18/2013 [facility] Patient Information 2015 [place], [place]. This information is not intended to replace advice given to you by your health care provider. Make sure you discuss any questions you have with your health care provider.  Fat and Cholesterol Control Diet Fat and cholesterol levels in your blood and organs are influenced by your diet. High levels of fat and cholesterol may lead to diseases of the heart, small and large blood vessels, gallbladder, liver, and pancreas. CONTROLLING FAT AND CHOLESTEROL WITH DIET Although exercise and lifestyle factors are important, your diet is key. That is because certain foods are known to raise cholesterol and others to lower it. The goal is to balance foods for their effect on cholesterol and more importantly, to replace saturated and trans fat with other types of fat, such as monounsaturated fat, polyunsaturated fat, and omega-3 fatty acids. On average, a person should consume no more than 15 to 17 g of saturated fat daily. Saturated and trans fats are considered "bad" fats, and they will raise LDL cholesterol. Saturated fats are primarily found in animal products such as meats, butter, and cream. However, that does not mean you need to give up all your favorite foods. Today, there are good tasting, low-fat, low-cholesterol substitutes for most of the things you like to eat. Choose low-fat or nonfat alternatives. Choose round or loin cuts of red meat. These types of cuts are lowest in fat and cholesterol. Chicken (without the skin), fish, veal, and ground [place] breast are great choices. Eliminate fatty meats, such as hot dogs and salami. Even shellfish have little or no saturated fat. Have a 3 oz (85 g) portion when you eat lean meat, poultry, or fish. Trans fats are also called "partially hydrogenated oils." They are oils that have been scientifically manipulated so that they are solid at  room temperature resulting in a longer shelf life and improved taste and texture of foods in which they are added. Trans fats are found in stick margarine, some tub margarines, cookies, crackers, and baked goods.  When baking and cooking, oils are a great substitute for butter. The monounsaturated oils are especially beneficial since it is believed they lower LDL and raise HDL. The oils you should avoid entirely are saturated tropical oils, such as coconut and palm.  Remember to eat a lot from food groups that are naturally free of saturated and trans fat, including fish, fruit, vegetables, beans, grains (barley, rice, couscous, bulgur wheat), and pasta (without cream sauces).  IDENTIFYING FOODS THAT LOWER FAT AND CHOLESTEROL  Soluble fiber may lower your cholesterol. This type of fiber is found in fruits such as apples, vegetables such as broccoli, potatoes, and carrots, legumes such as beans, peas, and lentils, and grains such as barley. Foods fortified with plant sterols (phytosterol) may also lower cholesterol. You should eat at least 2 g per day of these foods for a cholesterol lowering effect.  Read package labels to identify low-saturated fats, trans fat free, and low-fat foods at the supermarket. Select cheeses that have only 2 to 3 g saturated fat per ounce. Use a heart-healthy tub margarine that is free of trans fats or partially hydrogenated oil. When buying baked goods (cookies, crackers), avoid partially hydrogenated oils. Breads and muffins should be made from whole grains (whole-wheat or whole oat flour, instead of "flour" or "enriched flour"). Buy non-creamy canned soups with reduced salt and no added fats.  FOOD PREPARATION TECHNIQUES  Never deep-fry. If you must fry, either stir-fry, which uses very little fat, or use non-stick cooking sprays. When possible, broil, bake, or roast meats, and steam vegetables. Instead of putting butter or margarine on vegetables, use lemon and herbs,  applesauce, and cinnamon (for squash and sweet potatoes). Use nonfat yogurt, salsa, and low-fat dressings for salads.  LOW-SATURATED FAT / LOW-FAT FOOD SUBSTITUTES Meats / Saturated Fat (g) 4. Avoid: Steak, marbled (3 oz/85 g) / 11 g 5. Choose: Steak, lean (3 oz/85 g) / 4 g 3. Avoid: Hamburger (3 oz/85 g) / 7 g 4. Choose: Hamburger, lean (3 oz/85 g) / 5 g 3. Avoid: Ham (3 oz/85 g) / 6 g 4. Choose: Ham, lean cut (3 oz/85 g) / 2.4 g 5. Avoid: Chicken, with skin, dark meat (3 oz/85 g) / 4 g 6. Choose: Chicken, skin removed, dark meat (3 oz/85 g) / 2 g  Avoid: Chicken, with skin, light meat (3 oz/85 g) / 2.5 g  Choose: Chicken, skin removed, light meat (3 oz/85 g) / 1 g Dairy / Saturated Fat (g)  Avoid: Whole milk (1 cup) / 5 g  Choose: Low-fat milk, 2% (1 cup) / 3 g  Choose: Low-fat milk, 1% (1 cup) / 1.5 g  Choose: Skim milk (1 cup) / 0.3 g  Avoid: Hard cheese (1 oz/28 g) / 6 g  Choose: Skim milk cheese (1 oz/28 g) / 2 to 3 g  Avoid: Cottage cheese, 4% fat (1 cup) / 6.5 g  Choose: Low-fat cottage cheese, 1% fat (1 cup) / 1.5 g  Avoid: Ice cream (1 cup) / 9 g  Choose: Sherbet (1 cup) / 2.5 g  Choose: Nonfat frozen yogurt (1 cup) / 0.3 g  Choose: Frozen fruit bar / trace  Avoid: Whipped cream (1 tbs) / 3.5 g  Choose: Nondairy whipped topping (1 tbs) / 1 g Condiments /   Saturated Fat (g)  Avoid: Mayonnaise (1 tbs) / 2 g  Choose: Low-fat mayonnaise (1 tbs) / 1 g  Avoid: Butter (1 tbs) / 7 g  Choose: Extra light margarine (1 tbs) / 1 g  Avoid: Coconut oil (1 tbs) / 11.8 g  Choose: Olive oil (1 tbs) / 1.8 g  Choose: Corn oil (1 tbs) / 1.7 g  Choose: Safflower oil (1 tbs) / 1.2 g  Choose: Sunflower oil (1 tbs) / 1.4 g  Choose: Soybean oil (1 tbs) / 2.4 g  Choose: Canola oil (1 tbs) / 1 g Document Released: 06/16/2005 Document Revised: 10/11/2012 Document Reviewed: 09/14/2013 [employer] Patient Information 2015 [place], [place]. This information is not intended  to replace advice given to you by your health care provider. Make sure you discuss any questions you have with your health care provider.  American Heart Association (AHA) Exercise Recommendation  Being physically active is important to prevent heart disease and stroke, the nation's No. 1and No. 5killers. To improve overall cardiovascular health, we suggest at least 150 minutes per week of moderate exercise or 75 minutes per week of vigorous exercise (or a combination of moderate and vigorous activity). Thirty minutes a day, five times a week is an easy goal to remember. You will also experience benefits even if you divide your time into two or three segments of 10 to 15 minutes per day.  For people who would benefit from lowering their blood pressure or cholesterol, we recommend 40 minutes of aerobic exercise of moderate to vigorous intensity three to four times a week to lower the risk for heart attack and stroke.  Physical activity is anything that makes you move your body and burn calories.  This includes things like climbing stairs or playing sports. Aerobic exercises benefit your heart, and include walking, jogging, swimming or biking. Strength and stretching exercises are best for overall stamina and flexibility.  The simplest, positive change you can make to effectively improve your heart health is to start walking. It's enjoyable, free, easy, social and great exercise. A walking program is flexible and boasts high success rates because people can stick with it. It's easy for walking to become a regular and satisfying part of life.   For Overall Cardiovascular Health:  At least 30 minutes of moderate-intensity aerobic activity at least 5 days per week for a total of 150  OR   At least 25 minutes of vigorous aerobic activity at least 3 days per week for a total of 75 minutes; or a combination of moderate- and vigorous-intensity aerobic activity  AND   Moderate- to high-intensity  muscle-strengthening activity at least 2 days per week for additional health benefits.  For Lowering Blood Pressure and Cholesterol  An average 40 minutes of moderate- to vigorous-intensity aerobic activity 3 or 4 times per week  What if I can't make it to the time goal? Something is always better than nothing! And everyone has to start somewhere. Even if you've been sedentary for years, today is the day you can begin to make healthy changes in your life. If you don't think you'll make it for 30 or 40 minutes, set a reachable goal for today. You can work up toward your overall goal by increasing your time as you get stronger. Don't let all-or-nothing thinking rob you of doing what you can every day.  Source:http://www.heart.org

## 2014-12-16 LAB — COMPREHENSIVE METABOLIC PANEL
ALT: 19 IU/L (ref 0–32)
AST: 20 IU/L (ref 0–40)
Albumin/Globulin Ratio: 1.8 (ref 1.1–2.5)
Albumin: 4.2 g/dL (ref 3.5–5.5)
Alkaline Phosphatase: 59 IU/L (ref 39–117)
BUN/Creatinine Ratio: 13 (ref 9–23)
BUN: 14 mg/dL (ref 6–24)
Bilirubin Total: 0.4 mg/dL (ref 0.0–1.2)
CO2: 25 mmol/L (ref 18–29)
Calcium: 9.3 mg/dL (ref 8.7–10.2)
Chloride: 104 mmol/L (ref 97–108)
Creatinine, Ser: 1.04 mg/dL — ABNORMAL HIGH (ref 0.57–1.00)
GFR calc Af Amer: 68 mL/min/{1.73_m2} (ref >59)
GFR calc non Af Amer: 59 mL/min/{1.73_m2} — ABNORMAL LOW (ref >59)
Globulin, Total: 2.4 g/dL (ref 1.5–4.5)
Glucose: 91 mg/dL (ref 65–99)
Potassium: 4.4 mmol/L (ref 3.5–5.2)
Sodium: 143 mmol/L (ref 134–144)
Total Protein: 6.6 g/dL (ref 6.0–8.5)

## 2014-12-16 LAB — CBC WITH DIFFERENTIAL/PLATELET
Basophils Absolute: 0 10*3/uL (ref 0.0–0.2)
Basos: 0 %
EOS (ABSOLUTE): 0.1 10*3/uL (ref 0.0–0.4)
Eos: 2 %
Hematocrit: 38.2 % (ref 34.0–46.6)
Hemoglobin: 13 g/dL (ref 11.1–15.9)
Immature Grans (Abs): 0 10*3/uL (ref 0.0–0.1)
Immature Granulocytes: 0 %
Lymphocytes Absolute: 1.8 10*3/uL (ref 0.7–3.1)
Lymphs: 31 %
MCH: 30.9 pg (ref 26.6–33.0)
MCHC: 34 g/dL (ref 31.5–35.7)
MCV: 91 fL (ref 79–97)
Monocytes Absolute: 0.4 10*3/uL (ref 0.1–0.9)
Monocytes: 7 %
Neutrophils Absolute: 3.4 10*3/uL (ref 1.4–7.0)
Neutrophils: 60 %
Platelets: 214 10*3/uL (ref 150–379)
RBC: 4.21 x10E6/uL (ref 3.77–5.28)
RDW: 13.4 % (ref 12.3–15.4)
WBC: 5.7 10*3/uL (ref 3.4–10.8)

## 2014-12-16 LAB — LIPID PANEL
Chol/HDL Ratio: 4 ratio units (ref 0.0–4.4)
Cholesterol, Total: 177 mg/dL (ref 100–199)
HDL: 44 mg/dL (ref >39)
LDL Calculated: 112 mg/dL — ABNORMAL HIGH (ref 0–99)
Triglycerides: 107 mg/dL (ref 0–149)
VLDL Cholesterol Cal: 21 mg/dL (ref 5–40)

## 2014-12-16 LAB — T4, FREE: Free T4: 1.02 ng/dL (ref 0.82–1.77)

## 2014-12-16 LAB — T3: T3, Total: 104 ng/dL (ref 71–180)

## 2014-12-16 LAB — TSH: TSH: 2.91 u[IU]/mL (ref 0.450–4.500)

## 2014-12-18 ENCOUNTER — Telehealth: Payer: Self-pay

## 2014-12-18 NOTE — Telephone Encounter (Signed)
-----   Message from Mar Daring, PA-C sent at 12/18/2014  9:31 AM EDT ----- All labs are within normal limits and stable.  Thanks! -JB

## 2014-12-18 NOTE — Telephone Encounter (Signed)
Patient advised as directed below. Patient verbalized understanding. Lab results printed at front desk for pickup per patient's request.

## 2014-12-22 ENCOUNTER — Telehealth: Payer: Self-pay | Admitting: Physician Assistant

## 2014-12-22 NOTE — Telephone Encounter (Signed)
Left patient a detailed message on voicemail.

## 2014-12-22 NOTE — Telephone Encounter (Signed)
Can we please notify patient all labs are within normal limits.  Cholesterol is good.  No anemia.  TSH and thyroid panel were all normal.  Thanks! -JB

## 2014-12-25 ENCOUNTER — Encounter: Payer: Self-pay | Admitting: Physician Assistant

## 2015-02-01 ENCOUNTER — Encounter: Payer: Self-pay | Admitting: Family Medicine

## 2015-02-28 ENCOUNTER — Encounter: Payer: Self-pay | Admitting: Family Medicine

## 2015-05-28 ENCOUNTER — Ambulatory Visit (INDEPENDENT_AMBULATORY_CARE_PROVIDER_SITE_OTHER): Payer: BLUE CROSS/BLUE SHIELD | Admitting: Family Medicine

## 2015-05-28 VITALS — BP 114/70 | HR 64 | Temp 98.3°F | Resp 16 | Ht 70.0 in | Wt 217.0 lb

## 2015-05-28 DIAGNOSIS — M79604 Pain in right leg: Secondary | ICD-10-CM

## 2015-05-28 DIAGNOSIS — M79671 Pain in right foot: Secondary | ICD-10-CM | POA: Diagnosis not present

## 2015-05-28 NOTE — Progress Notes (Signed)
Patient ID: Parmis Farag, female   DOB: [birth year], 59 y.o.   MRN: [id number]   Jalliyah Pert  MRN: [id number] DOB: February 05, 1956  Subjective:  HPI   1. Right foot pain Patient is a 59 year old female who presents for evaluation of her right foot/leg pain/numbness.  She began having ankle pain in 2013.  She has been seen by Podiatry and an ankle specialist.  The podiatrist did x-rays, MRI and cortisone injections.  She then went to the ankle specialist in [place].  He said that she needed posterior tibial tendon repair.  She was not comfortable with that physician or his recommendation, so she decided to wait.  She notes that her ankle has been weakened and when she walks on any uneven surface she easily turns her ankle.  Last year she turned her ankle and fell causing her to break her shoulder.  She states that now the pain has progressed ans she has numbness in her foot, ankle and up her leg.  Patient Active Problem List   Diagnosis Date Noted  . Raynaud's phenomenon 12/15/2014  . Leg cramps, sleep related 12/15/2014  . Bleeding gums 12/15/2014  . Infraspinatus tenosynovitis 07/27/2014  . Impingement syndrome of shoulder 07/27/2014  . Closed fracture of glenoid cavity of scapula 07/27/2014  . Shoulder subluxation 07/27/2014  . Posterior tibial tendinitis 07/03/2014  . Abnormal weight gain 03/16/2014  . Bilateral polycystic ovarian syndrome 03/16/2014    Past Medical History  Diagnosis Date  . GERD (gastroesophageal reflux disease)   . Dysrhythmia     racing heart- neg workup  . Sleep apnea     inconclusive sleep study  . Anemia   . Depression     Social History   Social History  . Marital Status: Married    Spouse Name: N/A  . Number of Children: 2  . Years of Education: N/A   Occupational History  .     Social History Main Topics  . Smoking status: Never Smoker   . Smokeless tobacco: Not on file  . Alcohol Use: 0.0 oz/week    0 Standard  drinks or equivalent per week     Comment: on occasion   . Drug Use: No  . Sexual Activity:    Partners: Male   Other Topics Concern  . Not on file   Social History Narrative    Outpatient Prescriptions Prior to Visit  Medication Sig Dispense Refill  . metFORMIN (GLUCOPHAGE) 500 MG tablet Take 1 tablet by mouth daily.    . Multiple Vitamins-Minerals (MULTIVITAMIN WITH MINERALS) tablet Take 1 tablet by mouth daily.      . vitamin B-12 (CYANOCOBALAMIN) 1000 MCG tablet Take 1,000 mcg by mouth 2 (two) times daily.     No facility-administered medications prior to visit.    No Known Allergies  Review of Systems  Constitutional: Negative for fever, chills and malaise/fatigue.  Respiratory: Negative for cough, shortness of breath and wheezing.   Cardiovascular: Negative for chest pain, palpitations and orthopnea.  Musculoskeletal: Positive for joint pain (Right ankle, and left shoulder.). Negative for myalgias, back pain and neck pain. Falls: June 14, 2014.  Neurological: Negative for dizziness, weakness and headaches.   Objective:  BP 114/70 mmHg  Pulse 64  Temp(Src) 98.3 F (36.8 C) (Oral)  Resp 16  Ht 5\' 10"  (1.778 m)  Wt 217 lb (98.431 kg)  BMI 31.14 kg/m2  LMP 01/24/2011  Physical Exam  Constitutional: She is oriented to person, place, and  time and well-developed, well-nourished, and in no distress.  HENT:  Head: Normocephalic and atraumatic.  Right Ear: External ear normal.  Left Ear: External ear normal.  Nose: Nose normal.  Eyes: Conjunctivae are normal.  Neck: Neck supple.  Cardiovascular: Normal rate, regular rhythm and normal heart sounds.   Pulmonary/Chest: Effort normal.  Abdominal: Soft.  Musculoskeletal:  Foot exam normal.  Neurological: She is alert and oriented to person, place, and time.  Skin: Skin is warm and dry.  Psychiatric: Mood, memory, affect and judgment normal.    Assessment and Plan :  1. Right foot pain Refer back to  podiatry. 2.Early Neuropathy of foot  I have done the exam and reviewed the above chart and it is accurate to the best of my knowledge.  Miguel Aschoff MD [facility] 05/28/2015 3:46 PM

## 2015-07-12 ENCOUNTER — Ambulatory Visit (INDEPENDENT_AMBULATORY_CARE_PROVIDER_SITE_OTHER): Payer: BLUE CROSS/BLUE SHIELD | Admitting: Family Medicine

## 2015-07-12 ENCOUNTER — Encounter: Payer: Self-pay | Admitting: Family Medicine

## 2015-07-12 VITALS — BP 122/82 | HR 72 | Temp 98.0°F | Resp 16 | Wt 216.0 lb

## 2015-07-12 DIAGNOSIS — G4730 Sleep apnea, unspecified: Secondary | ICD-10-CM | POA: Insufficient documentation

## 2015-07-12 DIAGNOSIS — R7303 Prediabetes: Secondary | ICD-10-CM | POA: Insufficient documentation

## 2015-07-12 DIAGNOSIS — G540 Brachial plexus disorders: Secondary | ICD-10-CM | POA: Insufficient documentation

## 2015-07-12 DIAGNOSIS — F329 Major depressive disorder, single episode, unspecified: Secondary | ICD-10-CM

## 2015-07-12 DIAGNOSIS — D649 Anemia, unspecified: Secondary | ICD-10-CM | POA: Insufficient documentation

## 2015-07-12 DIAGNOSIS — IMO0002 Reserved for concepts with insufficient information to code with codable children: Secondary | ICD-10-CM | POA: Insufficient documentation

## 2015-07-12 DIAGNOSIS — K219 Gastro-esophageal reflux disease without esophagitis: Secondary | ICD-10-CM | POA: Insufficient documentation

## 2015-07-12 DIAGNOSIS — N183 Chronic kidney disease, stage 3 unspecified: Secondary | ICD-10-CM | POA: Insufficient documentation

## 2015-07-12 DIAGNOSIS — E119 Type 2 diabetes mellitus without complications: Secondary | ICD-10-CM | POA: Insufficient documentation

## 2015-07-12 DIAGNOSIS — F4320 Adjustment disorder, unspecified: Secondary | ICD-10-CM | POA: Insufficient documentation

## 2015-07-12 DIAGNOSIS — R740 Nonspecific elevation of levels of transaminase and lactic acid dehydrogenase [LDH]: Secondary | ICD-10-CM

## 2015-07-12 DIAGNOSIS — E669 Obesity, unspecified: Secondary | ICD-10-CM | POA: Insufficient documentation

## 2015-07-12 DIAGNOSIS — G44209 Tension-type headache, unspecified, not intractable: Secondary | ICD-10-CM | POA: Insufficient documentation

## 2015-07-12 DIAGNOSIS — I10 Essential (primary) hypertension: Secondary | ICD-10-CM | POA: Insufficient documentation

## 2015-07-12 DIAGNOSIS — E663 Overweight: Secondary | ICD-10-CM | POA: Insufficient documentation

## 2015-07-12 DIAGNOSIS — F32A Depression, unspecified: Secondary | ICD-10-CM | POA: Insufficient documentation

## 2015-07-12 DIAGNOSIS — N1831 Chronic kidney disease, stage 3a: Secondary | ICD-10-CM | POA: Insufficient documentation

## 2015-07-12 MED ORDER — DULOXETINE HCL 30 MG PO CPEP: 30.0000 mg | ORAL_CAPSULE | Freq: Every day | ORAL | Status: DC

## 2015-07-12 NOTE — Progress Notes (Signed)
Patient ID: Kathy Farmer, female   DOB: 10/11/1955, 60 y.o.   MRN: [id number]    Subjective:  HPI Pt is here today to discuss depression. She is taking care of mother that has dementia. She is crying a lot, more aggravated, not sleeping, feels depressed and overwhelmed. She has talked to counselor but she did not like her. She wants to see what her options are as far as another counselor or medications.   Prior to Admission medications   Medication Sig Start Date End Date Taking? Authorizing Provider  calcium-vitamin D (CALCIUM 500/D) 500-200 MG-UNIT tablet Take by mouth.   Yes Historical Provider, MD  metFORMIN (GLUCOPHAGE) 500 MG tablet Take 1 tablet by mouth daily. 12/01/13  Yes Historical Provider, MD  vitamin B-12 (CYANOCOBALAMIN) 1000 MCG tablet Take 1,000 mcg by mouth 2 (two) times daily.   Yes Historical Provider, MD    Patient Active Problem List   Diagnosis Date Noted  . Adaptation reaction 07/12/2015  . Absolute anemia 07/12/2015  . Clinical depression 07/12/2015  . Diabetes ([place]) 07/12/2015  . Elevation of level of transaminase or lactic acid dehydrogenase (LDH) 07/12/2015  . Acid reflux 07/12/2015  . Benign essential HTN 07/12/2015  . Adiposity 07/12/2015  . Apnea, sleep 07/12/2015  . Tension type headache 07/12/2015  . Thoracic outlet syndrome 07/12/2015  . Raynaud's phenomenon 12/15/2014  . Leg cramps, sleep related 12/15/2014  . Bleeding gums 12/15/2014  . Infraspinatus tenosynovitis 07/27/2014  . Impingement syndrome of shoulder 07/27/2014  . Closed fracture of glenoid cavity of scapula 07/27/2014  . Shoulder subluxation 07/27/2014  . Posterior tibial tendinitis 07/03/2014  . Abnormal weight gain 03/16/2014  . Bilateral polycystic ovarian syndrome 03/16/2014    Past Medical History  Diagnosis Date  . GERD (gastroesophageal reflux disease)   . Dysrhythmia     racing heart- neg workup  . Sleep apnea     inconclusive sleep study  . Anemia   .  Depression     Social History   Social History  . Marital Status: Married    Spouse Name: N/A  . Number of Children: 2  . Years of Education: N/A   Occupational History  .     Social History Main Topics  . Smoking status: Never Smoker   . Smokeless tobacco: Not on file  . Alcohol Use: 0.0 oz/week    0 Standard drinks or equivalent per week     Comment: on occasion   . Drug Use: No  . Sexual Activity:    Partners: Male   Other Topics Concern  . Not on file   Social History Narrative    No Known Allergies  Review of Systems  Constitutional: Negative.   HENT: Negative.   Eyes: Negative.   Respiratory: Negative.   Cardiovascular: Negative.   Gastrointestinal: Negative.   Genitourinary: Negative.   Musculoskeletal: Negative.   Skin: Negative.   Neurological: Negative.   Endo/Heme/Allergies: Negative.   Psychiatric/Behavioral: Positive for depression. The patient is nervous/anxious.     Immunization History  Administered Date(s) Administered  . Influenza-Unspecified 05/01/2015  . Tdap 07/01/2007   Objective:  BP 122/82 mmHg  Pulse 72  Temp(Src) 98 F (36.7 C) (Oral)  Resp 16  Wt 216 lb (97.977 kg)  LMP 01/24/2011  Depression screen [facility] 2/9 07/12/2015 12/15/2014  Decreased Interest 3 1  Down, Depressed, Hopeless 3 1  PHQ - 2 Score 6 2  Altered sleeping 3 3  Tired, decreased energy 0 2  Change in  appetite 2 1  Feeling bad or failure about yourself  3 1  Trouble concentrating 3 0  Moving slowly or fidgety/restless 3 0  Suicidal thoughts 0 0  PHQ-9 Score 20 9  Difficult doing work/chores Extremely dIfficult Not difficult at all     Physical Exam  Constitutional: She is well-developed, well-nourished, and in no distress.  HENT:  Head: Normocephalic and atraumatic.  Cardiovascular: Normal rate, regular rhythm and normal heart sounds.   Pulmonary/Chest: Effort normal and breath sounds normal.  Skin: Skin is warm and dry.  Psychiatric: Memory,  affect and judgment normal.  Sad affect, maybe a little flat. Crying some today.    Lab Results  Component Value Date   WBC 5.7 12/15/2014   HGB 11.8* 02/17/2011   HCT 38.2 12/15/2014   PLT 214 12/15/2014   GLUCOSE 91 12/15/2014   CHOL 177 12/15/2014   TRIG 107 12/15/2014   HDL 44 12/15/2014   LDLCALC 112* 12/15/2014   TSH 2.910 12/15/2014    CMP     Component Value Date/Time   NA 143 12/15/2014 0926   K 4.4 12/15/2014 0926   CL 104 12/15/2014 0926   CO2 25 12/15/2014 0926   GLUCOSE 91 12/15/2014 0926   BUN 14 12/15/2014 0926   CREATININE 1.04* 12/15/2014 0926   CALCIUM 9.3 12/15/2014 0926   PROT 6.6 12/15/2014 0926   ALBUMIN 4.2 12/15/2014 0926   AST 20 12/15/2014 0926   ALT 19 12/15/2014 0926   ALKPHOS 59 12/15/2014 0926   BILITOT 0.4 12/15/2014 0926   GFRNONAA 59* 12/15/2014 0926   GFRAA 68 12/15/2014 0926    Assessment and Plan :  1. Depression Patient is not suicidal. Follow up in 1 month - DULoxetine (CYMBALTA) 30 MG capsule; Take 1 capsule (30 mg total) by mouth daily.  Dispense: 30 capsule; Refill: 3 She has significant anhedonia also. Score on PHQ9 is 20  Patient was seen and examined by Dr. Miguel Aschoff, and noted scribed by Webb Laws, [place] MD [facility] 07/12/2015 11:40 AM

## 2015-08-09 ENCOUNTER — Ambulatory Visit: Payer: BLUE CROSS/BLUE SHIELD | Admitting: Family Medicine

## 2015-08-15 ENCOUNTER — Ambulatory Visit: Payer: BLUE CROSS/BLUE SHIELD | Admitting: Family Medicine

## 2015-08-29 ENCOUNTER — Encounter: Payer: Self-pay | Admitting: Family Medicine

## 2015-08-29 ENCOUNTER — Ambulatory Visit (INDEPENDENT_AMBULATORY_CARE_PROVIDER_SITE_OTHER): Payer: BLUE CROSS/BLUE SHIELD | Admitting: Family Medicine

## 2015-08-29 VITALS — BP 118/82 | HR 72 | Temp 97.7°F | Resp 16 | Wt 217.0 lb

## 2015-08-29 DIAGNOSIS — F329 Major depressive disorder, single episode, unspecified: Secondary | ICD-10-CM

## 2015-08-29 DIAGNOSIS — F32A Depression, unspecified: Secondary | ICD-10-CM

## 2015-08-29 NOTE — Progress Notes (Signed)
Patient ID: Kathy Farmer, female   DOB: 04/02/1956, 60 y.o.   MRN: [id number]    Subjective:  HPI Pt is here for a 1 month follow up of depression. She was started on Cymbalta. She is doing well on the medication. She has not noticed any side effects. She is about 75% better. She still has trouble sleeping but she has always had trouble sleeping and it does not seem to be any worse than normal.   Prior to Admission medications   Medication Sig Start Date End Date Taking? Authorizing Provider  calcium-vitamin D (CALCIUM 500/D) 500-200 MG-UNIT tablet Take by mouth.   Yes Historical Provider, MD  DULoxetine (CYMBALTA) 30 MG capsule Take 1 capsule (30 mg total) by mouth daily. 07/12/15  Yes Henretta Quist Maceo Pro., MD  metFORMIN (GLUCOPHAGE) 500 MG tablet Take 1 tablet by mouth daily. 12/01/13  Yes Historical Provider, MD  vitamin B-12 (CYANOCOBALAMIN) 1000 MCG tablet Take 1,000 mcg by mouth 2 (two) times daily.   Yes Historical Provider, MD    Patient Active Problem List   Diagnosis Date Noted  . Adaptation reaction 07/12/2015  . Absolute anemia 07/12/2015  . Clinical depression 07/12/2015  . Diabetes ([place]) 07/12/2015  . Elevation of level of transaminase or lactic acid dehydrogenase (LDH) 07/12/2015  . Acid reflux 07/12/2015  . Benign essential HTN 07/12/2015  . Adiposity 07/12/2015  . Apnea, sleep 07/12/2015  . Tension type headache 07/12/2015  . Thoracic outlet syndrome 07/12/2015  . Raynaud's phenomenon 12/15/2014  . Leg cramps, sleep related 12/15/2014  . Bleeding gums 12/15/2014  . Infraspinatus tenosynovitis 07/27/2014  . Impingement syndrome of shoulder 07/27/2014  . Closed fracture of glenoid cavity of scapula 07/27/2014  . Shoulder subluxation 07/27/2014  . Posterior tibial tendinitis 07/03/2014  . Abnormal weight gain 03/16/2014  . Bilateral polycystic ovarian syndrome 03/16/2014    Past Medical History  Diagnosis Date  . GERD (gastroesophageal reflux disease)    . Dysrhythmia     racing heart- neg workup  . Sleep apnea     inconclusive sleep study  . Anemia   . Depression     Social History   Social History  . Marital Status: Married    Spouse Name: N/A  . Number of Children: 2  . Years of Education: N/A   Occupational History  .     Social History Main Topics  . Smoking status: Never Smoker   . Smokeless tobacco: Not on file  . Alcohol Use: 0.0 oz/week    0 Standard drinks or equivalent per week     Comment: on occasion   . Drug Use: No  . Sexual Activity:    Partners: Male   Other Topics Concern  . Not on file   Social History Narrative    No Known Allergies  Review of Systems  Constitutional: Negative.   HENT: Negative.   Eyes: Negative.   Respiratory: Negative.   Cardiovascular: Negative.   Gastrointestinal: Negative.   Genitourinary: Negative.   Musculoskeletal: Negative.   Skin: Negative.   Neurological: Negative.   Endo/Heme/Allergies: Negative.     Immunization History  Administered Date(s) Administered  . Influenza-Unspecified 05/01/2015  . Tdap 07/01/2007   Objective:  BP 118/82 mmHg  Pulse 72  Temp(Src) 97.7 F (36.5 C) (Oral)  Resp 16  Wt 217 lb (98.431 kg)  LMP 01/24/2011  Physical Exam  Constitutional: She is oriented to person, place, and time and well-developed, well-nourished, and in no distress.  Eyes: Conjunctivae  and EOM are normal. Pupils are equal, round, and reactive to light.  Neck: Normal range of motion. Neck supple.  Cardiovascular: Normal rate, regular rhythm, normal heart sounds and intact distal pulses.   Pulmonary/Chest: Effort normal and breath sounds normal.  Musculoskeletal: Normal range of motion.  Neurological: She is alert and oriented to person, place, and time. She has normal reflexes. Gait normal. GCS score is 15.  Skin: Skin is warm and dry.  Psychiatric: Mood, memory, affect and judgment normal.    Lab Results  Component Value Date   WBC 5.7 12/15/2014    HGB 11.8* 02/17/2011   HCT 38.2 12/15/2014   PLT 214 12/15/2014   GLUCOSE 91 12/15/2014   CHOL 177 12/15/2014   TRIG 107 12/15/2014   HDL 44 12/15/2014   LDLCALC 112* 12/15/2014   TSH 2.910 12/15/2014    CMP     Component Value Date/Time   NA 143 12/15/2014 0926   K 4.4 12/15/2014 0926   CL 104 12/15/2014 0926   CO2 25 12/15/2014 0926   GLUCOSE 91 12/15/2014 0926   BUN 14 12/15/2014 0926   CREATININE 1.04* 12/15/2014 0926   CALCIUM 9.3 12/15/2014 0926   PROT 6.6 12/15/2014 0926   ALBUMIN 4.2 12/15/2014 0926   AST 20 12/15/2014 0926   ALT 19 12/15/2014 0926   ALKPHOS 59 12/15/2014 0926   BILITOT 0.4 12/15/2014 0926   GFRNONAA 59* 12/15/2014 0926   GFRAA 68 12/15/2014 0926    Assessment and Plan :  1. Clinical depression Pt has 75% improved. Discussed possibly increasing Cymbalta but pt would like to stay on this dose for now.  Advised patient that would be wise to stay on this for at least another year. She is in agreement. Return to clinic 3-4 months.more than 50% of visit spent in counseling. Patient was seen and examined by Dr. Miguel Aschoff, and noted scribed by Webb Laws, CMA I have done the exam and reviewed the above chart and it is accurate to the best of my knowledge.  Miguel Aschoff MD [facility] 08/29/2015 11:21 AM

## 2015-09-13 LAB — TSH: TSH: 1.65 u[IU]/mL (ref <=5.90)

## 2015-11-01 DIAGNOSIS — M76821 Posterior tibial tendinitis, right leg: Secondary | ICD-10-CM | POA: Diagnosis not present

## 2015-11-14 DIAGNOSIS — D225 Melanocytic nevi of trunk: Secondary | ICD-10-CM | POA: Diagnosis not present

## 2015-11-14 DIAGNOSIS — L820 Inflamed seborrheic keratosis: Secondary | ICD-10-CM | POA: Diagnosis not present

## 2015-11-15 ENCOUNTER — Other Ambulatory Visit: Payer: Self-pay | Admitting: Family Medicine

## 2015-11-15 NOTE — Telephone Encounter (Signed)
Dr Marlan Palau patient-aa

## 2015-12-12 DIAGNOSIS — H2513 Age-related nuclear cataract, bilateral: Secondary | ICD-10-CM | POA: Diagnosis not present

## 2016-01-08 ENCOUNTER — Ambulatory Visit: Payer: BLUE CROSS/BLUE SHIELD | Admitting: Family Medicine

## 2016-01-17 ENCOUNTER — Ambulatory Visit (INDEPENDENT_AMBULATORY_CARE_PROVIDER_SITE_OTHER): Payer: BLUE CROSS/BLUE SHIELD | Admitting: Family Medicine

## 2016-01-17 VITALS — BP 126/72 | HR 68 | Temp 98.0°F | Resp 14 | Wt 217.0 lb

## 2016-01-17 DIAGNOSIS — F329 Major depressive disorder, single episode, unspecified: Secondary | ICD-10-CM | POA: Diagnosis not present

## 2016-01-17 DIAGNOSIS — F32A Depression, unspecified: Secondary | ICD-10-CM

## 2016-01-17 MED ORDER — DULOXETINE HCL 60 MG PO CPEP: 60.0000 mg | ORAL_CAPSULE | Freq: Every day | ORAL | Status: DC

## 2016-01-17 NOTE — Progress Notes (Signed)
Subjective:  HPI  Patient is here for follow up. Last visit was in march 2017. Depression: patient feels like she is better than the first time she was seen for this but not as well as she hopes to feel. She would like to discuss increasing medication. She takes Cymbalta 30 mg 1 tablet daily right now. Depression screen [facility] 2/9 01/17/2016 07/12/2015 12/15/2014  Decreased Interest 2 3 1   Down, Depressed, Hopeless 1 3 1   PHQ - 2 Score 3 6 2   Altered sleeping 3 3 3   Tired, decreased energy 0 0 2  Change in appetite 1 2 1   Feeling bad or failure about yourself  1 3 1   Trouble concentrating 2 3 0  Moving slowly or fidgety/restless 0 3 0  Suicidal thoughts 0 0 0  PHQ-9 Score 10 20 9   Difficult doing work/chores Somewhat difficult Extremely dIfficult Not difficult at all    Prior to Admission medications   Medication Sig Start Date End Date Taking? Authorizing Provider  calcium-vitamin D (CALCIUM 500/D) 500-200 MG-UNIT tablet Take by mouth.   Yes Historical Provider, MD  DULoxetine (CYMBALTA) 30 MG capsule TAKE 1 CAPSULE (30 MG TOTAL) BY MOUTH DAILY. 11/15/15  Yes Birdie Sons, MD  metFORMIN (GLUCOPHAGE) 500 MG tablet Take 1 tablet by mouth daily. 12/01/13  Yes Historical Provider, MD  vitamin B-12 (CYANOCOBALAMIN) 1000 MCG tablet Take 1,000 mcg by mouth 2 (two) times daily.   Yes Historical Provider, MD    Patient Active Problem List   Diagnosis Date Noted  . Adaptation reaction 07/12/2015  . Absolute anemia 07/12/2015  . Clinical depression 07/12/2015  . Diabetes ([place]) 07/12/2015  . Elevation of level of transaminase or lactic acid dehydrogenase (LDH) 07/12/2015  . Acid reflux 07/12/2015  . Benign essential HTN 07/12/2015  . Adiposity 07/12/2015  . Apnea, sleep 07/12/2015  . Tension type headache 07/12/2015  . Thoracic outlet syndrome 07/12/2015  . Raynaud's phenomenon 12/15/2014  . Leg cramps, sleep related 12/15/2014  . Bleeding gums 12/15/2014  . Infraspinatus  tenosynovitis 07/27/2014  . Impingement syndrome of shoulder 07/27/2014  . Closed fracture of glenoid cavity of scapula 07/27/2014  . Shoulder subluxation 07/27/2014  . Posterior tibial tendinitis 07/03/2014  . Abnormal weight gain 03/16/2014  . Bilateral polycystic ovarian syndrome 03/16/2014    Past Medical History  Diagnosis Date  . GERD (gastroesophageal reflux disease)   . Dysrhythmia     racing heart- neg workup  . Sleep apnea     inconclusive sleep study  . Anemia   . Depression     Social History   Social History  . Marital Status: Married    Spouse Name: N/A  . Number of Children: 2  . Years of Education: N/A   Occupational History  .     Social History Main Topics  . Smoking status: Never Smoker   . Smokeless tobacco: Not on file  . Alcohol Use: 0.0 oz/week    0 Standard drinks or equivalent per week     Comment: on occasion   . Drug Use: No  . Sexual Activity:    Partners: Male   Other Topics Concern  . Not on file   Social History Narrative    No Known Allergies  Review of Systems  Constitutional: Negative.   Respiratory: Negative.   Cardiovascular: Negative.   Musculoskeletal: Negative.   Psychiatric/Behavioral: Positive for depression. The patient has insomnia.     Immunization History  Administered Date(s) Administered  .  Influenza-Unspecified 05/01/2015  . Tdap 07/01/2007   Objective:  BP 126/72 mmHg  Pulse 68  Temp(Src) 98 F (36.7 C)  Resp 14  Wt 217 lb (98.431 kg)  LMP 01/24/2011  Physical Exam  Constitutional: She is well-developed, well-nourished, and in no distress.  HENT:  Head: Normocephalic and atraumatic.  Right Ear: External ear normal.  Nose: Nose normal.  Eyes: Conjunctivae are normal.  Neck: Neck supple.  Cardiovascular: Normal rate, regular rhythm and normal heart sounds.   Pulmonary/Chest: Effort normal and breath sounds normal.  Skin: Skin is warm and dry.  Psychiatric: Mood, memory, affect and judgment  normal.    Lab Results  Component Value Date   WBC 5.7 12/15/2014   HGB 11.8* 02/17/2011   HCT 38.2 12/15/2014   PLT 214 12/15/2014   GLUCOSE 91 12/15/2014   CHOL 177 12/15/2014   TRIG 107 12/15/2014   HDL 44 12/15/2014   LDLCALC 112* 12/15/2014   TSH 2.910 12/15/2014    CMP     Component Value Date/Time   NA 143 12/15/2014 0926   K 4.4 12/15/2014 0926   CL 104 12/15/2014 0926   CO2 25 12/15/2014 0926   GLUCOSE 91 12/15/2014 0926   BUN 14 12/15/2014 0926   CREATININE 1.04* 12/15/2014 0926   CALCIUM 9.3 12/15/2014 0926   PROT 6.6 12/15/2014 0926   ALBUMIN 4.2 12/15/2014 0926   AST 20 12/15/2014 0926   ALT 19 12/15/2014 0926   ALKPHOS 59 12/15/2014 0926   BILITOT 0.4 12/15/2014 0926   GFRNONAA 59* 12/15/2014 0926   GFRAA 68 12/15/2014 0926    Assessment and Plan :  1. Depression Much improved but not in remission. Double Cymbalta dose and return to clinic 1-2 months - DULoxetine (CYMBALTA) 60 MG capsule; Take 1 capsule (60 mg total) by mouth daily.  Dispense: 30 capsule; Refill: [facility] 01/17/2016 3:54 PM

## 2016-03-13 DIAGNOSIS — E282 Polycystic ovarian syndrome: Secondary | ICD-10-CM | POA: Diagnosis not present

## 2016-03-13 LAB — BASIC METABOLIC PANEL
BUN: 15 mg/dL (ref 4–21)
Creatinine: 1 mg/dL (ref 0.5–1.1)
Glucose: 93 mg/dL
Potassium: 4.5 mmol/L (ref 3.4–5.3)
Sodium: 143 mmol/L (ref 137–147)

## 2016-03-13 LAB — HEMOGLOBIN A1C: Hemoglobin A1C: 5.8

## 2016-03-19 ENCOUNTER — Ambulatory Visit: Payer: BLUE CROSS/BLUE SHIELD | Admitting: Family Medicine

## 2016-03-20 DIAGNOSIS — E538 Deficiency of other specified B group vitamins: Secondary | ICD-10-CM | POA: Diagnosis not present

## 2016-03-20 DIAGNOSIS — E282 Polycystic ovarian syndrome: Secondary | ICD-10-CM | POA: Diagnosis not present

## 2016-03-20 DIAGNOSIS — E669 Obesity, unspecified: Secondary | ICD-10-CM | POA: Diagnosis not present

## 2016-04-02 ENCOUNTER — Ambulatory Visit: Payer: BLUE CROSS/BLUE SHIELD | Admitting: Family Medicine

## 2016-04-02 DIAGNOSIS — H43812 Vitreous degeneration, left eye: Secondary | ICD-10-CM | POA: Diagnosis not present

## 2016-04-07 ENCOUNTER — Encounter: Payer: Self-pay | Admitting: Family Medicine

## 2016-04-07 ENCOUNTER — Ambulatory Visit (INDEPENDENT_AMBULATORY_CARE_PROVIDER_SITE_OTHER): Payer: BLUE CROSS/BLUE SHIELD | Admitting: Family Medicine

## 2016-04-07 VITALS — BP 142/88 | HR 74 | Resp 14 | Wt 211.0 lb

## 2016-04-07 DIAGNOSIS — Z2821 Immunization not carried out because of patient refusal: Secondary | ICD-10-CM

## 2016-04-07 DIAGNOSIS — F3289 Other specified depressive episodes: Secondary | ICD-10-CM | POA: Diagnosis not present

## 2016-04-07 NOTE — Progress Notes (Signed)
Kathy Farmer  MRN: [id number] DOB: [birth year]  Subjective:  HPI   Patient is here for follow up on depression. On her last visit in July Cymbalta was increased to 60 mg. She is doing better on this dose 70% improved. No side effects.   Patient Active Problem List   Diagnosis Date Noted  . Adaptation reaction 07/12/2015  . Absolute anemia 07/12/2015  . Clinical depression 07/12/2015  . Diabetes ([place]) 07/12/2015  . Elevation of level of transaminase or lactic acid dehydrogenase (LDH) 07/12/2015  . Acid reflux 07/12/2015  . Benign essential HTN 07/12/2015  . Adiposity 07/12/2015  . Apnea, sleep 07/12/2015  . Tension type headache 07/12/2015  . Thoracic outlet syndrome 07/12/2015  . Raynaud's phenomenon 12/15/2014  . Leg cramps, sleep related 12/15/2014  . Bleeding gums 12/15/2014  . Infraspinatus tenosynovitis 07/27/2014  . Impingement syndrome of shoulder 07/27/2014  . Closed fracture of glenoid cavity of scapula 07/27/2014  . Shoulder subluxation 07/27/2014  . Posterior tibial tendinitis 07/03/2014  . Abnormal weight gain 03/16/2014  . Bilateral polycystic ovarian syndrome 03/16/2014    Past Medical History:  Diagnosis Date  . Anemia   . Depression   . Dysrhythmia    racing heart- neg workup  . GERD (gastroesophageal reflux disease)   . Sleep apnea    inconclusive sleep study    Social History   Social History  . Marital status: Married    Spouse name: N/A  . Number of children: 2  . Years of education: N/A   Occupational History  .  [employer]   Social History Main Topics  . Smoking status: Never Smoker  . Smokeless tobacco: Never Used  . Alcohol use 0.0 oz/week     Comment: on occasion   . Drug use: No  . Sexual activity: Yes    Partners: Male   Other Topics Concern  . Not on file   Social History Narrative  . No narrative on file    Outpatient Encounter Prescriptions as of 04/07/2016  Medication Sig Note  .  calcium-vitamin D (CALCIUM 500/D) 500-200 MG-UNIT tablet Take by mouth. 07/12/2015: Received from: [facility]  . DULoxetine (CYMBALTA) 60 MG capsule Take 1 capsule (60 mg total) by mouth daily.   . metFORMIN (GLUCOPHAGE) 500 MG tablet Take 1 tablet by mouth daily. 12/15/2014: Received from: [facility]: Take by mouth.  . vitamin B-12 (CYANOCOBALAMIN) 1000 MCG tablet Take 1,000 mcg by mouth 2 (two) times daily.    No facility-administered encounter medications on file as of 04/07/2016.     No Known Allergies  Review of Systems  Constitutional: Negative.   Respiratory: Negative.   Cardiovascular: Negative.   Musculoskeletal: Negative.   Psychiatric/Behavioral: Negative.    Objective:  BP (!) 142/88   Pulse 74   Resp 14   Wt 211 lb (95.7 kg)   LMP 01/24/2011   BMI 30.28 kg/m   Physical Exam  Constitutional: She is well-developed, well-nourished, and in no distress.  HENT:  Head: Normocephalic and atraumatic.  Eyes: Conjunctivae are normal. No scleral icterus.  Neck: No thyromegaly present.  Cardiovascular: Normal rate, regular rhythm and normal heart sounds.   Pulmonary/Chest: Effort normal and breath sounds normal.  Skin: Skin is warm and dry.  Psychiatric: Mood, memory, affect and judgment normal.    Assessment and Plan :  1. Other depression/MDD Improved.  RTC 4-6 months. Continue meds until at least spring 2018. 2.DM,/Prediabetes HPI,  Exam and A&P transcribed under direction and in the presence of Miguel Aschoff, MD.  I have done the exam and reviewed the chart and it is accurate to the best of my knowledge. Miguel Aschoff M.D. [facility]

## 2016-04-30 DIAGNOSIS — Z23 Encounter for immunization: Secondary | ICD-10-CM | POA: Diagnosis not present

## 2016-09-10 ENCOUNTER — Ambulatory Visit: Payer: BLUE CROSS/BLUE SHIELD | Admitting: Family Medicine

## 2016-10-02 DIAGNOSIS — E538 Deficiency of other specified B group vitamins: Secondary | ICD-10-CM | POA: Diagnosis not present

## 2016-10-02 DIAGNOSIS — E669 Obesity, unspecified: Secondary | ICD-10-CM | POA: Diagnosis not present

## 2016-10-02 DIAGNOSIS — E282 Polycystic ovarian syndrome: Secondary | ICD-10-CM | POA: Diagnosis not present

## 2016-10-02 LAB — VITAMIN B12: Vitamin B-12: 308

## 2016-10-02 LAB — HEMOGLOBIN A1C: Hemoglobin A1C: 5.5

## 2016-10-02 LAB — TSH: TSH: 2.53 (ref 0.41–5.90)

## 2016-10-09 DIAGNOSIS — I10 Essential (primary) hypertension: Secondary | ICD-10-CM | POA: Diagnosis not present

## 2016-10-09 DIAGNOSIS — E282 Polycystic ovarian syndrome: Secondary | ICD-10-CM | POA: Diagnosis not present

## 2016-10-09 DIAGNOSIS — E538 Deficiency of other specified B group vitamins: Secondary | ICD-10-CM | POA: Diagnosis not present

## 2016-11-13 DIAGNOSIS — L821 Other seborrheic keratosis: Secondary | ICD-10-CM | POA: Diagnosis not present

## 2016-11-20 ENCOUNTER — Encounter: Payer: Self-pay | Admitting: Family Medicine

## 2016-11-20 ENCOUNTER — Ambulatory Visit (INDEPENDENT_AMBULATORY_CARE_PROVIDER_SITE_OTHER): Payer: BLUE CROSS/BLUE SHIELD | Admitting: Family Medicine

## 2016-11-20 VITALS — BP 142/74 | HR 76 | Temp 98.0°F | Resp 16 | Ht 70.0 in | Wt 196.0 lb

## 2016-11-20 DIAGNOSIS — I10 Essential (primary) hypertension: Secondary | ICD-10-CM | POA: Diagnosis not present

## 2016-11-20 DIAGNOSIS — E538 Deficiency of other specified B group vitamins: Secondary | ICD-10-CM | POA: Diagnosis not present

## 2016-11-20 MED ORDER — LOSARTAN POTASSIUM 50 MG PO TABS
50.0000 mg | ORAL_TABLET | Freq: Every day | ORAL | 11 refills | Status: DC
Start: 2016-11-20 — End: 2017-01-20

## 2016-11-20 MED ORDER — CYANOCOBALAMIN 1000 MCG/ML IJ SOLN
1000.0000 ug | Freq: Once | INTRAMUSCULAR | Status: AC
  Administered 2016-11-20: 1000 ug via INTRAMUSCULAR

## 2016-11-20 NOTE — Progress Notes (Signed)
Patient: Kathy Farmer Female    DOB: 1956-05-19   61 y.o.   MRN: [id number] Visit Date: 11/20/2016  Today's Provider: Wilhemena Durie, MD   Chief Complaint  Patient presents with  . Hypertension  . Depression   Subjective:    HPI Patient comes in today for a follow up. Patient was last seen in the office 7 months ago. No medications were changed since her last visit. Patient also feels like she is doing ok emotionally. Her son is getting married in a few weeks.  Mother is in failing health.Dementia. Patient reports that her endocrinologist (Dr. Eddie Dibbles) suggested that we monitor her BP. She reports that her BP has been elevated lately. She denies any chest pain, headaches, shortness of breath.   Her last labs were drawn on 10/02/2016 by Dr. Eddie Dibbles and were WNL.     No Known Allergies   Current Outpatient Prescriptions:  .  calcium-vitamin D (CALCIUM 500/D) 500-200 MG-UNIT tablet, Take by mouth., Disp: , Rfl:  .  DULoxetine (CYMBALTA) 60 MG capsule, Take 1 capsule (60 mg total) by mouth daily., Disp: 30 capsule, Rfl: 12 .  metFORMIN (GLUCOPHAGE) 500 MG tablet, Take 1 tablet by mouth daily., Disp: , Rfl:  .  vitamin B-12 (CYANOCOBALAMIN) 1000 MCG tablet, Take 1,000 mcg by mouth 2 (two) times daily., Disp: , Rfl:   Review of Systems  Constitutional: Negative.   Respiratory: Negative.   Cardiovascular: Negative.     Social History  Substance Use Topics  . Smoking status: Never Smoker  . Smokeless tobacco: Never Used  . Alcohol use 0.0 oz/week     Comment: on occasion    Objective:   BP (!) 142/74 (BP Location: Right Arm, Patient Position: Sitting, Cuff Size: Normal)   Pulse 76   Temp 98 F (36.7 C)   Resp 16   Ht 5\' 10"  (1.778 m)   Wt 196 lb (88.9 kg)   LMP 01/24/2011   SpO2 98%   BMI 28.12 kg/m  Vitals:   11/20/16 1438  BP: (!) 142/74  Pulse: 76  Resp: 16  Temp: 98 F (36.7 C)  SpO2: 98%  Weight: 196 lb (88.9 kg)  Height: 5\' 10"  (1.778 m)       Physical Exam  Constitutional: She is oriented to person, place, and time. She appears well-developed and well-nourished.  HENT:  Head: Normocephalic and atraumatic.  Eyes: Conjunctivae are normal. No scleral icterus.  Neck: No thyromegaly present.  Cardiovascular: Normal rate, regular rhythm and normal heart sounds.   Pulmonary/Chest: Effort normal and breath sounds normal.  Abdominal: Soft.  Neurological: She is alert and oriented to person, place, and time.  Skin: Skin is warm and dry.  Psychiatric: She has a normal mood and affect. Her behavior is normal. Judgment and thought content normal.        Assessment & Plan:     1. Benign essential HTN New problem. Start Losartan as below. FU 2 months, or sooner if needed. - losartan (COZAAR) 50 MG tablet; Take 1 tablet (50 mg total) by mouth daily.  Dispense: 30 tablet; Refill: 11  2. B12 deficiency Start b12 injections today. Administered in right deltoid. - cyanocobalamin ((VITAMIN B-12)) injection 1,000 mcg; Inject 1 mL (1,000 mcg total) into the muscle once.  3.MDD/mild Controlled 4.Prediabetes Per dr Eddie Dibbles.     I have done the exam and reviewed the above chart and it is accurate to the best of my knowledge.  Development worker, community has been used in this note in any air is in the dictation or transcription are unintentional.  Wilhemena Durie, MD  [place]

## 2016-11-26 ENCOUNTER — Telehealth: Payer: Self-pay

## 2016-11-26 NOTE — Telephone Encounter (Signed)
Should be weekly for total of 4 weeks.

## 2016-11-26 NOTE — Telephone Encounter (Signed)
Patient called with a question. She states she was here on 11/20/16 and she thought she was told to start B12 injections once a week for a month and then monthly but her appointment for her second B12 is not till a month from the first one administered on 11/20/16. She is confused on how often to get them done, note does not specify. Please review-aa

## 2016-11-26 NOTE — Telephone Encounter (Signed)
Advised patient of below. Appt scheduled for next week.

## 2016-11-28 ENCOUNTER — Ambulatory Visit (INDEPENDENT_AMBULATORY_CARE_PROVIDER_SITE_OTHER): Payer: BLUE CROSS/BLUE SHIELD | Admitting: Emergency Medicine

## 2016-11-28 DIAGNOSIS — E538 Deficiency of other specified B group vitamins: Secondary | ICD-10-CM

## 2016-11-28 MED ORDER — CYANOCOBALAMIN 1000 MCG/ML IJ SOLN
1000.0000 ug | Freq: Once | INTRAMUSCULAR | Status: AC
  Administered 2016-11-28: 1000 ug via INTRAMUSCULAR

## 2016-12-02 ENCOUNTER — Telehealth: Payer: Self-pay | Admitting: Family Medicine

## 2016-12-02 NOTE — Telephone Encounter (Signed)
[id number] 

## 2016-12-02 NOTE — Telephone Encounter (Signed)
Pt called to cancel her B 12 shot appt for Friday 12/05/16 b/c she had a reaction the last time (11/28/16) and she didn't want to take the risk of having a reaction since her son is getting married on Saturday and they have a rehearsal dinner on Friday. Pt stated that after she had her 2nd B-12 shot her eyes started swelling shout and she got really warm, just didn't feel well, and was nauseous. Pt stated after about 2 hours of laying at home in the bed doing nothing she started feeling better and by the next morning she felt fine and swelling had gone away. Pt stated she didn't have any reaction when she received the first shot. Pt stated that she will call back to reschedule b/c she is ok with eye swelling if the B-12 in going to help her with the other but she just doesn't want to risk it the night before the wedding. Please advise. Thanks TNP

## 2016-12-02 NOTE — Telephone Encounter (Signed)
Please review for dr Rosanna Randy. Please route the answer to the nurse box if it is after today due to I will not be in the office after today. Thank you-aa

## 2016-12-02 NOTE — Telephone Encounter (Signed)
OK to postpone shot. Should see eye swelling if it happens again.

## 2016-12-04 NOTE — Telephone Encounter (Signed)
Advised patient as below.  

## 2016-12-05 ENCOUNTER — Ambulatory Visit: Payer: BLUE CROSS/BLUE SHIELD

## 2016-12-17 ENCOUNTER — Telehealth: Payer: Self-pay | Admitting: Family Medicine

## 2016-12-17 NOTE — Telephone Encounter (Signed)
Pt is asking if it ok to have the B12 shot on Friday 12/19/16 due to having a reaction to the last time she has the B12 shot?  Please advise.  CB#[phone]/MW

## 2016-12-17 NOTE — Telephone Encounter (Signed)
Yes--but what type reaction?

## 2016-12-19 ENCOUNTER — Ambulatory Visit: Payer: Self-pay

## 2017-01-20 ENCOUNTER — Ambulatory Visit (INDEPENDENT_AMBULATORY_CARE_PROVIDER_SITE_OTHER): Payer: BLUE CROSS/BLUE SHIELD | Admitting: Family Medicine

## 2017-01-20 VITALS — BP 132/74 | HR 78 | Temp 98.4°F | Resp 14 | Wt 199.8 lb

## 2017-01-20 DIAGNOSIS — E538 Deficiency of other specified B group vitamins: Secondary | ICD-10-CM | POA: Diagnosis not present

## 2017-01-20 DIAGNOSIS — M25571 Pain in right ankle and joints of right foot: Secondary | ICD-10-CM

## 2017-01-20 DIAGNOSIS — E282 Polycystic ovarian syndrome: Secondary | ICD-10-CM | POA: Diagnosis not present

## 2017-01-20 DIAGNOSIS — F3289 Other specified depressive episodes: Secondary | ICD-10-CM

## 2017-01-20 DIAGNOSIS — G8929 Other chronic pain: Secondary | ICD-10-CM | POA: Diagnosis not present

## 2017-01-20 DIAGNOSIS — I10 Essential (primary) hypertension: Secondary | ICD-10-CM

## 2017-01-20 MED ORDER — VITAMIN B-12 1000 MCG PO TABS: 1000.0000 ug | ORAL_TABLET | Freq: Every day | ORAL | 0 refills | Status: DC

## 2017-01-20 NOTE — Progress Notes (Signed)
Kathy Farmer  MRN: [id number] DOB: 01/08/1956  Subjective:  HPI  Patient is here for 2 months follow up after starting Losartan for b/p. Patient has been checking her b/p and readings have been 105-130/65-85. Patient has noticed since starting Losartan she has noticed trouble with concentration and her depression symptoms got worse. Not sure if this is related but did not develop until after taking Losartan. BP Readings from Last 3 Encounters:  01/20/17 132/74  11/20/16 (!) 142/74  04/07/16 (!) 142/88   Depression screen PHQ 2/9 01/20/2017 01/17/2016 07/12/2015  Decreased Interest 1 2 3   Down, Depressed, Hopeless 1 1 3   PHQ - 2 Score 2 3 6   Altered sleeping 1 3 3   Tired, decreased energy 0 0 0  Change in appetite 1 1 2   Feeling bad or failure about yourself  2 1 3   Trouble concentrating 3 2 3   Moving slowly or fidgety/restless 0 0 3  Suicidal thoughts 0 0 0  PHQ-9 Score 9 10 20   Difficult doing work/chores - Somewhat difficult Extremely dIfficult    Weight: 199 lb 12.8 oz (90.6 kg)  Also patient was started on B12 injections on her last visit in may. She received 1st injection and then 2nd injection a week later and this time developed eye swelling and nausea after that one. She did not get anymore after second time, she called and left a message asking if it is ok to try and get third one and see what happens but never received a reply.  Patient is still seen Dr Eddie Dibbles for DM. Last office visit was in April and patient states she follows up with that doctor every 6 months.  Patient Active Problem List   Diagnosis Date Noted  . B12 deficiency 11/20/2016  . Adaptation reaction 07/12/2015  . Absolute anemia 07/12/2015  . Clinical depression 07/12/2015  . Diabetes ([place]) 07/12/2015  . Elevation of level of transaminase or lactic acid dehydrogenase (LDH) 07/12/2015  . Acid reflux 07/12/2015  . Benign essential HTN 07/12/2015  . Adiposity 07/12/2015  . Apnea, sleep  07/12/2015  . Tension type headache 07/12/2015  . Thoracic outlet syndrome 07/12/2015  . Raynaud's phenomenon 12/15/2014  . Leg cramps, sleep related 12/15/2014  . Bleeding gums 12/15/2014  . Infraspinatus tenosynovitis 07/27/2014  . Impingement syndrome of shoulder 07/27/2014  . Closed fracture of glenoid cavity of scapula 07/27/2014  . Shoulder subluxation 07/27/2014  . Posterior tibial tendinitis 07/03/2014  . Abnormal weight gain 03/16/2014  . Bilateral polycystic ovarian syndrome 03/16/2014    Past Medical History:  Diagnosis Date  . Anemia   . Depression   . Dysrhythmia    racing heart- neg workup  . GERD (gastroesophageal reflux disease)   . Sleep apnea    inconclusive sleep study    Social History   Social History  . Marital status: Married    Spouse name: N/A  . Number of children: 2  . Years of education: N/A   Occupational History  .  [employer]   Social History Main Topics  . Smoking status: Never Smoker  . Smokeless tobacco: Never Used  . Alcohol use 0.0 oz/week     Comment: on occasion   . Drug use: No  . Sexual activity: Yes    Partners: Male   Other Topics Concern  . Not on file   Social History Narrative  . No narrative on file    Outpatient Encounter Prescriptions as of 01/20/2017  Medication  Sig Note  . calcium-vitamin D (CALCIUM 500/D) 500-200 MG-UNIT tablet Take by mouth. 07/12/2015: Received from: [facility]  . DULoxetine (CYMBALTA) 60 MG capsule Take 1 capsule (60 mg total) by mouth daily.   Marland Kitchen losartan (COZAAR) 50 MG tablet Take 1 tablet (50 mg total) by mouth daily.   . metFORMIN (GLUCOPHAGE) 500 MG tablet Take 1 tablet by mouth daily. 12/15/2014: Received from: [facility]: Take by mouth.  . [DISCONTINUED] vitamin B-12 (CYANOCOBALAMIN) 1000 MCG tablet Take 1,000 mcg by mouth 2 (two) times daily.    No facility-administered encounter medications on file as of 01/20/2017.       No Known Allergies  Review of Systems  Constitutional: Positive for malaise/fatigue.  Respiratory: Negative.   Cardiovascular: Negative.   Musculoskeletal: Negative.        Ankle stiffness-right side  Psychiatric/Behavioral: Positive for depression.       Trouble concetrating    Objective:  BP 132/74   Pulse 78   Temp 98.4 F (36.9 C)   Resp 14   Wt 199 lb 12.8 oz (90.6 kg)   LMP 01/24/2011   BMI 28.67 kg/m   Physical Exam  Constitutional: She is oriented to person, place, and time and well-developed, well-nourished, and in no distress.  HENT:  Head: Normocephalic and atraumatic.  Cardiovascular: Normal rate, regular rhythm, normal heart sounds and intact distal pulses.  Exam reveals no gallop.   No murmur heard. Pulmonary/Chest: Effort normal and breath sounds normal. No respiratory distress. She has no wheezes.  Musculoskeletal: She exhibits tenderness (tenderness inferior and posterior to the medial malleous on the right, otherwise stable.). She exhibits no edema.  Neurological: She is alert and oriented to person, place, and time.   Assessment and Plan :  1. Benign essential HTN Will stop Losartan at this time, patient has had trouble concentrating and depression worse since starting this medication. Will re check in September. Advised patient to check her b/p and pulse at home and bring in readings on the next visit.  2. B12 deficiency Patient had a reaction after B12 injection, patient advised to go back to B12 tablets instead, patient has tolerated tablets fine.  3. Other depression Worse since starting Losartan. Will stop Losartan and re check in September.  4. Bilateral polycystic ovarian syndrome Sees Dr Gabriel Carina. Will get records to update our records.  5. Chronic pain of right ankle Patient has seen 3 orthopedics, a orthopedic neurosurgeon, and a neurologist. Had steroid injection. Has had work up including an MRI around 2016. No improvement or found out  the etiology for the symptoms. Will refer patient to Dr Almedia Balls and advised patient make sure she specializes in the feet/ankles and if not will refer patient to [facility] or [place] at this time.  HPI, Exam and A&P transcribed by Theressa Millard, RMA under direction and in the presence of Miguel Aschoff, MD. I have done the exam and reviewed the chart and it is accurate to the best of my knowledge. Development worker, community has been used and  any errors in dictation or transcription are unintentional. Miguel Aschoff M.D. [facility]

## 2017-01-22 DIAGNOSIS — M76821 Posterior tibial tendinitis, right leg: Secondary | ICD-10-CM | POA: Diagnosis not present

## 2017-02-03 DIAGNOSIS — M2141 Flat foot [pes planus] (acquired), right foot: Secondary | ICD-10-CM | POA: Diagnosis not present

## 2017-02-05 ENCOUNTER — Other Ambulatory Visit: Payer: Self-pay | Admitting: Family Medicine

## 2017-02-05 DIAGNOSIS — F329 Major depressive disorder, single episode, unspecified: Secondary | ICD-10-CM

## 2017-02-05 DIAGNOSIS — F32A Depression, unspecified: Secondary | ICD-10-CM

## 2017-03-10 ENCOUNTER — Ambulatory Visit (INDEPENDENT_AMBULATORY_CARE_PROVIDER_SITE_OTHER): Payer: BLUE CROSS/BLUE SHIELD | Admitting: Family Medicine

## 2017-03-10 VITALS — BP 140/88 | HR 68 | Temp 97.8°F | Resp 16 | Wt 201.0 lb

## 2017-03-10 DIAGNOSIS — Z1239 Encounter for other screening for malignant neoplasm of breast: Secondary | ICD-10-CM

## 2017-03-10 DIAGNOSIS — I10 Essential (primary) hypertension: Secondary | ICD-10-CM | POA: Diagnosis not present

## 2017-03-10 DIAGNOSIS — Z1231 Encounter for screening mammogram for malignant neoplasm of breast: Secondary | ICD-10-CM | POA: Diagnosis not present

## 2017-03-10 MED ORDER — HYDROCHLOROTHIAZIDE 25 MG PO TABS: 25.0000 mg | ORAL_TABLET | Freq: Every day | ORAL | 3 refills | Status: DC

## 2017-03-10 NOTE — Progress Notes (Signed)
Kathy Farmer  MRN: [id number] DOB: February 10, 1956  Subjective:  HPI   The patient is a 61 year old female who presents for follow up of her hypertension.  She was last seen on 01/20/17 and at that time she was having side effects to the medicine.  She felt her depression was worsening due to the medicine.  She was going to stop the medicine but when she saw her foot doctor he told her that she was going to need surgery and asked her to stay on the medicine.  The patient did not stop the medicine.  There has been no changes in her blood pressure or depression.  Patient Active Problem List   Diagnosis Date Noted  . B12 deficiency 11/20/2016  . Adaptation reaction 07/12/2015  . Absolute anemia 07/12/2015  . Clinical depression 07/12/2015  . Diabetes ([place]) 07/12/2015  . Elevation of level of transaminase or lactic acid dehydrogenase (LDH) 07/12/2015  . Acid reflux 07/12/2015  . Benign essential HTN 07/12/2015  . Adiposity 07/12/2015  . Apnea, sleep 07/12/2015  . Tension type headache 07/12/2015  . Thoracic outlet syndrome 07/12/2015  . Raynaud's phenomenon 12/15/2014  . Leg cramps, sleep related 12/15/2014  . Bleeding gums 12/15/2014  . Infraspinatus tenosynovitis 07/27/2014  . Impingement syndrome of shoulder 07/27/2014  . Closed fracture of glenoid cavity of scapula 07/27/2014  . Shoulder subluxation 07/27/2014  . Posterior tibial tendinitis 07/03/2014  . Abnormal weight gain 03/16/2014  . Bilateral polycystic ovarian syndrome 03/16/2014    Past Medical History:  Diagnosis Date  . Anemia   . Depression   . Dysrhythmia    racing heart- neg workup  . GERD (gastroesophageal reflux disease)   . Sleep apnea    inconclusive sleep study    Social History   Social History  . Marital status: Married    Spouse name: N/A  . Number of children: 2  . Years of education: N/A   Occupational History  .  [employer]   Social History Main Topics  . Smoking  status: Never Smoker  . Smokeless tobacco: Never Used  . Alcohol use 0.0 oz/week     Comment: on occasion   . Drug use: No  . Sexual activity: Yes    Partners: Male   Other Topics Concern  . Not on file   Social History Narrative  . No narrative on file    Outpatient Encounter Prescriptions as of 03/10/2017  Medication Sig Note  . calcium-vitamin D (CALCIUM 500/D) 500-200 MG-UNIT tablet Take by mouth. 07/12/2015: Received from: [facility]  . DULoxetine (CYMBALTA) 60 MG capsule TAKE ONE CAPSULE BY MOUTH DAILY   . losartan (COZAAR) 50 MG tablet Take 50 mg by mouth daily.   . metFORMIN (GLUCOPHAGE) 500 MG tablet Take 1 tablet by mouth daily. 12/15/2014: Received from: [facility]: Take by mouth.  . vitamin B-12 (CYANOCOBALAMIN) 1000 MCG tablet Take 1 tablet (1,000 mcg total) by mouth daily.    No facility-administered encounter medications on file as of 03/10/2017.     Allergies  Allergen Reactions  . Losartan Other (See Comments)    Depression worse and trouble concetrating Depression worse and trouble concetrating  . Vitamin B12 Other (See Comments)    Injection form only-had swollen in both eyes, nausea after getting second injection. Tolerates tablet form Injection form only-had swollen in both eyes, nausea after getting second injection. Tolerates tablet form    Review of Systems  Constitutional: Negative for fever and malaise/fatigue.  Respiratory: Negative for cough, shortness of breath and wheezing.   Cardiovascular: Negative for chest pain, palpitations and orthopnea.  Neurological: Negative for weakness.  Psychiatric/Behavioral: Positive for depression. Negative for hallucinations, memory loss, substance abuse and suicidal ideas. The patient has insomnia. The patient is not nervous/anxious.     Objective:  BP 140/88 (BP Location: Right Arm, Patient Position: Sitting, Cuff Size: Normal)   Pulse 68   Temp 97.8 F (36.6  C) (Oral)   Resp 16   Wt 201 lb (91.2 kg)   LMP 01/24/2011   BMI 28.84 kg/m   Physical Exam  Constitutional: She is oriented to person, place, and time and well-developed, well-nourished, and in no distress.  HENT:  Head: Normocephalic and atraumatic.  Eyes: Conjunctivae are normal. No scleral icterus.  Neck: No thyromegaly present.  Cardiovascular: Normal rate, regular rhythm and normal heart sounds.   Pulmonary/Chest: Effort normal and breath sounds normal.  Abdominal: Soft.  Neurological: She is alert and oriented to person, place, and time. Gait normal. GCS score is 15.  Skin: Skin is warm and dry.  Psychiatric: Mood, memory, affect and judgment normal.    Assessment and Plan :  1. Benign essential HTN  - hydrochlorothiazide (HYDRODIURIL) 25 MG tablet; Take 1 tablet (25 mg total) by mouth daily.  Dispense: 90 tablet; Refill: 3  2. Breast cancer screening  - [facility] MAMMOGRAPHY; Future 3.Depression In remission.  I have done the exam and reviewed the chart and it is accurate to the best of my knowledge. Development worker, community has been used and  any errors in dictation or transcription are unintentional. Miguel Aschoff M.D. [facility]

## 2017-03-19 ENCOUNTER — Encounter: Payer: Self-pay | Admitting: Family Medicine

## 2017-04-02 DIAGNOSIS — E282 Polycystic ovarian syndrome: Secondary | ICD-10-CM | POA: Diagnosis not present

## 2017-04-02 DIAGNOSIS — E538 Deficiency of other specified B group vitamins: Secondary | ICD-10-CM | POA: Diagnosis not present

## 2017-04-07 ENCOUNTER — Encounter: Payer: Self-pay | Admitting: Family Medicine

## 2017-04-07 ENCOUNTER — Ambulatory Visit (INDEPENDENT_AMBULATORY_CARE_PROVIDER_SITE_OTHER): Payer: BLUE CROSS/BLUE SHIELD | Admitting: Family Medicine

## 2017-04-07 ENCOUNTER — Encounter (INDEPENDENT_AMBULATORY_CARE_PROVIDER_SITE_OTHER): Payer: Self-pay

## 2017-04-07 VITALS — BP 100/60 | HR 76 | Temp 97.6°F | Resp 16 | Wt 202.0 lb

## 2017-04-07 DIAGNOSIS — I10 Essential (primary) hypertension: Secondary | ICD-10-CM

## 2017-04-07 DIAGNOSIS — Z23 Encounter for immunization: Secondary | ICD-10-CM | POA: Diagnosis not present

## 2017-04-07 DIAGNOSIS — Z1231 Encounter for screening mammogram for malignant neoplasm of breast: Secondary | ICD-10-CM

## 2017-04-07 DIAGNOSIS — Z1239 Encounter for other screening for malignant neoplasm of breast: Secondary | ICD-10-CM

## 2017-04-07 MED ORDER — LOSARTAN POTASSIUM-HCTZ 50-12.5 MG PO TABS: 1.0000 | ORAL_TABLET | Freq: Every day | ORAL | 3 refills | Status: DC

## 2017-04-07 NOTE — Progress Notes (Signed)
Patient: Kathy Farmer Female    DOB: 1956/03/13   61 y.o.   MRN: [id number] Visit Date: 04/07/2017  Today's Provider: Wilhemena Durie, MD   Chief Complaint  Patient presents with  . Hypertension   Subjective:    HPI  Hypertension, follow-up:  BP Readings from Last 3 Encounters:  04/07/17 100/60  03/10/17 140/88  01/20/17 132/74    She was last seen for hypertension 2 months ago.  BP at that visit was 140/88. Management since that visit includes started HCTZ. She reports good compliance with treatment. She is not having side effects.  She is exercising. She is adherent to low salt diet.   Outside blood pressures are 116-120/80-90. Patient denies chest pain, chest pressure/discomfort, claudication, dyspnea, exertional chest pressure/discomfort, fatigue, irregular heart beat, lower extremity edema, near-syncope, orthopnea, palpitations, paroxysmal nocturnal dyspnea, syncope and tachypnea.     Wt Readings from Last 3 Encounters:  04/07/17 202 lb (91.6 kg)  03/10/17 201 lb (91.2 kg)  01/20/17 199 lb 12.8 oz (90.6 kg)   ------------------------------------------------------------------------      Allergies  Allergen Reactions  . Losartan Other (See Comments)    Depression worse and trouble concetrating Depression worse and trouble concetrating  . Vitamin B12 Other (See Comments)    Injection form only-had swollen in both eyes, nausea after getting second injection. Tolerates tablet form Injection form only-had swollen in both eyes, nausea after getting second injection. Tolerates tablet form     Current Outpatient Prescriptions:  .  calcium-vitamin D (CALCIUM 500/D) 500-200 MG-UNIT tablet, Take by mouth., Disp: , Rfl:  .  DULoxetine (CYMBALTA) 60 MG capsule, TAKE ONE CAPSULE BY MOUTH DAILY, Disp: 30 capsule, Rfl: 12 .  hydrochlorothiazide (HYDRODIURIL) 25 MG tablet, Take 1 tablet (25 mg total) by mouth daily., Disp: 90 tablet, Rfl: 3 .   losartan (COZAAR) 50 MG tablet, Take 50 mg by mouth daily., Disp: , Rfl:  .  metFORMIN (GLUCOPHAGE) 500 MG tablet, Take 1 tablet by mouth daily., Disp: , Rfl:  .  vitamin B-12 (CYANOCOBALAMIN) 1000 MCG tablet, Take 1 tablet (1,000 mcg total) by mouth daily. (Patient not taking: Reported on 04/07/2017), Disp: 30 tablet, Rfl: 0  Review of Systems  Constitutional: Negative.   HENT: Negative.   Eyes: Negative.   Respiratory: Negative.   Cardiovascular: Negative.   Gastrointestinal: Negative.   Endocrine: Negative.   Genitourinary: Negative.   Musculoskeletal: Negative.   Skin: Negative.   Allergic/Immunologic: Negative.   Neurological: Negative.   Hematological: Negative.   Psychiatric/Behavioral: Negative.     Social History  Substance Use Topics  . Smoking status: Never Smoker  . Smokeless tobacco: Never Used  . Alcohol use 0.0 oz/week     Comment: on occasion    Objective:   BP 100/60 (BP Location: Left Arm, Patient Position: Sitting, Cuff Size: Large)   Pulse 76   Temp 97.6 F (36.4 C) (Oral)   Resp 16   Wt 202 lb (91.6 kg)   LMP 01/24/2011   BMI 28.98 kg/m  Vitals:   04/07/17 1606  BP: 100/60  Pulse: 76  Resp: 16  Temp: 97.6 F (36.4 C)  TempSrc: Oral  Weight: 202 lb (91.6 kg)     Physical Exam  Constitutional: She is oriented to person, place, and time. She appears well-developed and well-nourished.  Eyes: Pupils are equal, round, and reactive to light. Conjunctivae and EOM are normal.  Neck: Normal range of motion. Neck supple.  Cardiovascular:  Normal rate, regular rhythm, normal heart sounds and intact distal pulses.   Pulmonary/Chest: Breath sounds normal.  Neurological: She is alert and oriented to person, place, and time. She has normal reflexes.  Skin: Skin is warm and dry.  Psychiatric: She has a normal mood and affect. Her behavior is normal. Judgment and thought content normal.        Assessment & Plan:     1. Benign essential HTN Improved.  Send in the combination drug and lower the dose of HCTZ. Follow up in 6 month.   - losartan-hydrochlorothiazide (HYZAAR) 50-12.5 MG tablet; Take 1 tablet by mouth daily.  Dispense: 90 tablet; Refill: 3  2. Need for influenza vaccination  - Flu Vaccine QUAD 36+ mos IM 3.Depression Stable.     HPI, Exam, and A&P Transcribed under the direction and in the presence of Deunta Beneke L. Cranford Mon, MD  Electronically Signed: Katina Dung, CMA  I have done the exam and reviewed the above chart and it is accurate to the best of my knowledge. Development worker, community has been used in this note in any air is in the dictation or transcription are unintentional.  Wilhemena Durie, MD  [place]

## 2017-04-07 NOTE — Patient Instructions (Addendum)
We have combined your 2 medications Losartan and hydrochlorothiazide and lower the hydrochlorothiazide to 12.5 mg. Also get a copy of you recent labs from other providers so that we can make sure you are up to date on lab work. Thanks.

## 2017-04-09 DIAGNOSIS — E538 Deficiency of other specified B group vitamins: Secondary | ICD-10-CM | POA: Diagnosis not present

## 2017-04-09 DIAGNOSIS — E282 Polycystic ovarian syndrome: Secondary | ICD-10-CM | POA: Diagnosis not present

## 2017-04-09 DIAGNOSIS — I10 Essential (primary) hypertension: Secondary | ICD-10-CM | POA: Diagnosis not present

## 2017-05-13 DIAGNOSIS — M76821 Posterior tibial tendinitis, right leg: Secondary | ICD-10-CM | POA: Diagnosis not present

## 2017-05-13 DIAGNOSIS — Z981 Arthrodesis status: Secondary | ICD-10-CM | POA: Diagnosis not present

## 2017-05-13 DIAGNOSIS — G8918 Other acute postprocedural pain: Secondary | ICD-10-CM | POA: Diagnosis not present

## 2017-06-03 DIAGNOSIS — Z4789 Encounter for other orthopedic aftercare: Secondary | ICD-10-CM | POA: Diagnosis not present

## 2017-06-25 DIAGNOSIS — M76829 Posterior tibial tendinitis, unspecified leg: Secondary | ICD-10-CM | POA: Diagnosis not present

## 2017-06-25 DIAGNOSIS — Z4789 Encounter for other orthopedic aftercare: Secondary | ICD-10-CM | POA: Diagnosis not present

## 2017-06-25 DIAGNOSIS — M19071 Primary osteoarthritis, right ankle and foot: Secondary | ICD-10-CM | POA: Diagnosis not present

## 2017-07-07 DIAGNOSIS — M6281 Muscle weakness (generalized): Secondary | ICD-10-CM | POA: Diagnosis not present

## 2017-07-07 DIAGNOSIS — M25571 Pain in right ankle and joints of right foot: Secondary | ICD-10-CM | POA: Diagnosis not present

## 2017-07-07 DIAGNOSIS — M25671 Stiffness of right ankle, not elsewhere classified: Secondary | ICD-10-CM | POA: Diagnosis not present

## 2017-07-07 DIAGNOSIS — R262 Difficulty in walking, not elsewhere classified: Secondary | ICD-10-CM | POA: Diagnosis not present

## 2017-07-09 DIAGNOSIS — M6281 Muscle weakness (generalized): Secondary | ICD-10-CM | POA: Diagnosis not present

## 2017-07-09 DIAGNOSIS — M25571 Pain in right ankle and joints of right foot: Secondary | ICD-10-CM | POA: Diagnosis not present

## 2017-07-09 DIAGNOSIS — M25671 Stiffness of right ankle, not elsewhere classified: Secondary | ICD-10-CM | POA: Diagnosis not present

## 2017-07-09 DIAGNOSIS — R262 Difficulty in walking, not elsewhere classified: Secondary | ICD-10-CM | POA: Diagnosis not present

## 2017-07-14 DIAGNOSIS — R262 Difficulty in walking, not elsewhere classified: Secondary | ICD-10-CM | POA: Diagnosis not present

## 2017-07-14 DIAGNOSIS — M25571 Pain in right ankle and joints of right foot: Secondary | ICD-10-CM | POA: Diagnosis not present

## 2017-07-14 DIAGNOSIS — M6281 Muscle weakness (generalized): Secondary | ICD-10-CM | POA: Diagnosis not present

## 2017-07-14 DIAGNOSIS — M25671 Stiffness of right ankle, not elsewhere classified: Secondary | ICD-10-CM | POA: Diagnosis not present

## 2017-07-16 DIAGNOSIS — M25571 Pain in right ankle and joints of right foot: Secondary | ICD-10-CM | POA: Diagnosis not present

## 2017-07-16 DIAGNOSIS — M6281 Muscle weakness (generalized): Secondary | ICD-10-CM | POA: Diagnosis not present

## 2017-07-16 DIAGNOSIS — R262 Difficulty in walking, not elsewhere classified: Secondary | ICD-10-CM | POA: Diagnosis not present

## 2017-07-16 DIAGNOSIS — M25671 Stiffness of right ankle, not elsewhere classified: Secondary | ICD-10-CM | POA: Diagnosis not present

## 2017-07-21 DIAGNOSIS — M6281 Muscle weakness (generalized): Secondary | ICD-10-CM | POA: Diagnosis not present

## 2017-07-21 DIAGNOSIS — M25571 Pain in right ankle and joints of right foot: Secondary | ICD-10-CM | POA: Diagnosis not present

## 2017-07-21 DIAGNOSIS — R262 Difficulty in walking, not elsewhere classified: Secondary | ICD-10-CM | POA: Diagnosis not present

## 2017-07-21 DIAGNOSIS — M25671 Stiffness of right ankle, not elsewhere classified: Secondary | ICD-10-CM | POA: Diagnosis not present

## 2017-07-24 DIAGNOSIS — R262 Difficulty in walking, not elsewhere classified: Secondary | ICD-10-CM | POA: Diagnosis not present

## 2017-07-24 DIAGNOSIS — M6281 Muscle weakness (generalized): Secondary | ICD-10-CM | POA: Diagnosis not present

## 2017-07-24 DIAGNOSIS — M25671 Stiffness of right ankle, not elsewhere classified: Secondary | ICD-10-CM | POA: Diagnosis not present

## 2017-07-24 DIAGNOSIS — M25571 Pain in right ankle and joints of right foot: Secondary | ICD-10-CM | POA: Diagnosis not present

## 2017-07-29 DIAGNOSIS — R262 Difficulty in walking, not elsewhere classified: Secondary | ICD-10-CM | POA: Diagnosis not present

## 2017-07-29 DIAGNOSIS — M25671 Stiffness of right ankle, not elsewhere classified: Secondary | ICD-10-CM | POA: Diagnosis not present

## 2017-07-29 DIAGNOSIS — M6281 Muscle weakness (generalized): Secondary | ICD-10-CM | POA: Diagnosis not present

## 2017-07-29 DIAGNOSIS — M25571 Pain in right ankle and joints of right foot: Secondary | ICD-10-CM | POA: Diagnosis not present

## 2017-07-31 DIAGNOSIS — M25671 Stiffness of right ankle, not elsewhere classified: Secondary | ICD-10-CM | POA: Diagnosis not present

## 2017-07-31 DIAGNOSIS — R262 Difficulty in walking, not elsewhere classified: Secondary | ICD-10-CM | POA: Diagnosis not present

## 2017-07-31 DIAGNOSIS — M6281 Muscle weakness (generalized): Secondary | ICD-10-CM | POA: Diagnosis not present

## 2017-07-31 DIAGNOSIS — M25571 Pain in right ankle and joints of right foot: Secondary | ICD-10-CM | POA: Diagnosis not present

## 2017-08-05 DIAGNOSIS — M25571 Pain in right ankle and joints of right foot: Secondary | ICD-10-CM | POA: Diagnosis not present

## 2017-08-05 DIAGNOSIS — R262 Difficulty in walking, not elsewhere classified: Secondary | ICD-10-CM | POA: Diagnosis not present

## 2017-08-05 DIAGNOSIS — M6281 Muscle weakness (generalized): Secondary | ICD-10-CM | POA: Diagnosis not present

## 2017-08-05 DIAGNOSIS — M25671 Stiffness of right ankle, not elsewhere classified: Secondary | ICD-10-CM | POA: Diagnosis not present

## 2017-08-11 DIAGNOSIS — Z4789 Encounter for other orthopedic aftercare: Secondary | ICD-10-CM | POA: Diagnosis not present

## 2017-08-14 DIAGNOSIS — R262 Difficulty in walking, not elsewhere classified: Secondary | ICD-10-CM | POA: Diagnosis not present

## 2017-08-14 DIAGNOSIS — M25571 Pain in right ankle and joints of right foot: Secondary | ICD-10-CM | POA: Diagnosis not present

## 2017-08-14 DIAGNOSIS — M25671 Stiffness of right ankle, not elsewhere classified: Secondary | ICD-10-CM | POA: Diagnosis not present

## 2017-08-14 DIAGNOSIS — M6281 Muscle weakness (generalized): Secondary | ICD-10-CM | POA: Diagnosis not present

## 2017-08-17 DIAGNOSIS — M6281 Muscle weakness (generalized): Secondary | ICD-10-CM | POA: Diagnosis not present

## 2017-08-17 DIAGNOSIS — M25671 Stiffness of right ankle, not elsewhere classified: Secondary | ICD-10-CM | POA: Diagnosis not present

## 2017-08-17 DIAGNOSIS — M25571 Pain in right ankle and joints of right foot: Secondary | ICD-10-CM | POA: Diagnosis not present

## 2017-08-17 DIAGNOSIS — R262 Difficulty in walking, not elsewhere classified: Secondary | ICD-10-CM | POA: Diagnosis not present

## 2017-08-24 DIAGNOSIS — R262 Difficulty in walking, not elsewhere classified: Secondary | ICD-10-CM | POA: Diagnosis not present

## 2017-08-24 DIAGNOSIS — M25671 Stiffness of right ankle, not elsewhere classified: Secondary | ICD-10-CM | POA: Diagnosis not present

## 2017-08-24 DIAGNOSIS — M25571 Pain in right ankle and joints of right foot: Secondary | ICD-10-CM | POA: Diagnosis not present

## 2017-08-24 DIAGNOSIS — M6281 Muscle weakness (generalized): Secondary | ICD-10-CM | POA: Diagnosis not present

## 2017-08-31 DIAGNOSIS — M25571 Pain in right ankle and joints of right foot: Secondary | ICD-10-CM | POA: Diagnosis not present

## 2017-08-31 DIAGNOSIS — M6281 Muscle weakness (generalized): Secondary | ICD-10-CM | POA: Diagnosis not present

## 2017-08-31 DIAGNOSIS — R262 Difficulty in walking, not elsewhere classified: Secondary | ICD-10-CM | POA: Diagnosis not present

## 2017-08-31 DIAGNOSIS — M25671 Stiffness of right ankle, not elsewhere classified: Secondary | ICD-10-CM | POA: Diagnosis not present

## 2017-09-07 DIAGNOSIS — M6281 Muscle weakness (generalized): Secondary | ICD-10-CM | POA: Diagnosis not present

## 2017-09-07 DIAGNOSIS — M25571 Pain in right ankle and joints of right foot: Secondary | ICD-10-CM | POA: Diagnosis not present

## 2017-09-07 DIAGNOSIS — M25671 Stiffness of right ankle, not elsewhere classified: Secondary | ICD-10-CM | POA: Diagnosis not present

## 2017-09-07 DIAGNOSIS — R262 Difficulty in walking, not elsewhere classified: Secondary | ICD-10-CM | POA: Diagnosis not present

## 2017-10-06 ENCOUNTER — Ambulatory Visit: Payer: BLUE CROSS/BLUE SHIELD | Admitting: Family Medicine

## 2017-10-06 ENCOUNTER — Encounter: Payer: Self-pay | Admitting: Family Medicine

## 2017-10-06 VITALS — BP 120/80 | HR 62 | Temp 97.8°F | Resp 16 | Ht 70.0 in | Wt 211.0 lb

## 2017-10-06 DIAGNOSIS — I10 Essential (primary) hypertension: Secondary | ICD-10-CM

## 2017-10-06 DIAGNOSIS — F324 Major depressive disorder, single episode, in partial remission: Secondary | ICD-10-CM | POA: Diagnosis not present

## 2017-10-06 NOTE — Progress Notes (Signed)
Patient: Kathy Farmer Female    DOB: 05-20-1956   62 y.o.   MRN: [id number] Visit Date: 10/06/2017  Today's Provider: Wilhemena Durie, MD   Chief Complaint  Patient presents with  . Follow-up    HTN   Subjective:    HPI  Hypertension, follow-up:  BP Readings from Last 3 Encounters:  10/06/17 120/80  04/07/17 100/60  03/10/17 140/88    She was last seen for hypertension 6 months ago.  BP at that visit was 100/60. Management since that visit includes send in combo drug and lower dose of HCTZ. She reports excellent compliance with treatment. She is not having side effects.  She is not exercising. Reports she had ankle surgery and she is going to PT and is doing some exercises to help with it. She is adherent to low salt diet.   Outside blood pressures are 118's-120's over 70's-60's. She is experiencing none.  Patient denies chest pain, chest pressure/discomfort, exertional chest pressure/discomfort, fatigue, irregular heart beat, lower extremity edema, near-syncope and palpitations.   Cardiovascular risk factors include diabetes mellitus, hypertension and obesity (BMI >= 30 kg/m2).  Use of agents associated with hypertension: none.     Weight trend: increasing steadily Wt Readings from Last 3 Encounters:  10/06/17 211 lb (95.7 kg)  04/07/17 202 lb (91.6 kg)  03/10/17 201 lb (91.2 kg)    Current diet: well balanced  ------------------------------------------------------------------------  Diabetes: Patient is followed by Endocrine. Lat A1C was 5.9 04/02/2017. Reports she has an eye exam scheduled beginning May.  Depression: Per patient stable on the Cymbalta. Reports her mom has Dementia and the last month has been hard, but overall she feels stable.     Allergies  Allergen Reactions  . Vitamin B12 Other (See Comments)    Injection form only-had swollen in both eyes, nausea after getting second injection. Tolerates tablet form Injection  form only-had swollen in both eyes, nausea after getting second injection. Tolerates tablet form     Current Outpatient Medications:  .  calcium-vitamin D (CALCIUM 500/D) 500-200 MG-UNIT tablet, Take by mouth., Disp: , Rfl:  .  DULoxetine (CYMBALTA) 60 MG capsule, TAKE ONE CAPSULE BY MOUTH DAILY, Disp: 30 capsule, Rfl: 12 .  hydrochlorothiazide (HYDRODIURIL) 25 MG tablet, Take 1 tablet (25 mg total) by mouth daily., Disp: 90 tablet, Rfl: 3 .  losartan-hydrochlorothiazide (HYZAAR) 50-12.5 MG tablet, Take 1 tablet by mouth daily., Disp: 90 tablet, Rfl: 3 .  metFORMIN (GLUCOPHAGE) 500 MG tablet, Take 1 tablet by mouth daily., Disp: , Rfl:  .  vitamin B-12 (CYANOCOBALAMIN) 1000 MCG tablet, Take 1 tablet (1,000 mcg total) by mouth daily., Disp: 30 tablet, Rfl: 0  Review of Systems  Constitutional: Negative for fatigue.  HENT: Negative.   Eyes: Negative for visual disturbance.  Respiratory: Negative.   Cardiovascular: Negative for chest pain, palpitations and leg swelling.  Endocrine: Negative.   Allergic/Immunologic: Negative.   Neurological: Negative for dizziness, light-headedness and headaches.  Psychiatric/Behavioral: Negative.     Social History   Tobacco Use  . Smoking status: Never Smoker  . Smokeless tobacco: Never Used  Substance Use Topics  . Alcohol use: Yes    Alcohol/week: 0.0 oz    Comment: on occasion    Objective:   BP 120/80 (BP Location: Left Arm, Patient Position: Sitting, Cuff Size: Normal)   Pulse 62   Temp 97.8 F (36.6 C) (Oral)   Resp 16   Ht 5\' 10"  (1.778 m)  Wt 211 lb (95.7 kg)   LMP 01/24/2011   BMI 30.28 kg/m  Vitals:   10/06/17 0844  BP: 120/80  Pulse: 62  Resp: 16  Temp: 97.8 F (36.6 C)  TempSrc: Oral  Weight: 211 lb (95.7 kg)  Height: 5\' 10"  (1.778 m)     Physical Exam  Constitutional: She is oriented to person, place, and time. She appears well-developed and well-nourished.  HENT:  Head: Normocephalic and atraumatic.  Eyes: No  scleral icterus.  Neck: No thyromegaly present.  Cardiovascular: Normal rate, regular rhythm and normal heart sounds.  Pulmonary/Chest: Effort normal and breath sounds normal.  Abdominal: Soft.  Lymphadenopathy:    She has no cervical adenopathy.  Neurological: She is alert and oriented to person, place, and time.  Skin: Skin is warm and dry.  Psychiatric: She has a normal mood and affect. Her behavior is normal. Judgment and thought content normal.        Assessment & Plan:  1. Benign essential HTN -Stable.Continue current medication. -Follow-up in 6 months for CPE and HTN. 2.MDD-mild In remission.      I have done the exam and reviewed the chart and it is accurate to the best of my knowledge. Development worker, community has been used and  any errors in dictation or transcription are unintentional. Miguel Aschoff M.D. Ezel, MD  [facility]

## 2017-10-13 ENCOUNTER — Ambulatory Visit
Admission: RE | Admit: 2017-10-13 | Discharge: 2017-10-13 | Disposition: A | Discharge status: Home / Self Care | Payer: BLUE CROSS/BLUE SHIELD | Source: Ambulatory Visit | Attending: Family Medicine | Admitting: Family Medicine

## 2017-10-13 DIAGNOSIS — Z1231 Encounter for screening mammogram for malignant neoplasm of breast: Secondary | ICD-10-CM | POA: Diagnosis not present

## 2017-10-13 DIAGNOSIS — Z1239 Encounter for other screening for malignant neoplasm of breast: Secondary | ICD-10-CM

## 2017-11-02 DIAGNOSIS — H2513 Age-related nuclear cataract, bilateral: Secondary | ICD-10-CM | POA: Diagnosis not present

## 2018-01-11 ENCOUNTER — Encounter: Payer: Self-pay | Admitting: Family Medicine

## 2018-01-11 ENCOUNTER — Ambulatory Visit: Payer: BLUE CROSS/BLUE SHIELD | Admitting: Family Medicine

## 2018-01-11 VITALS — BP 126/76 | HR 72 | Temp 98.0°F | Resp 16 | Wt 213.0 lb

## 2018-01-11 DIAGNOSIS — H6981 Other specified disorders of Eustachian tube, right ear: Secondary | ICD-10-CM

## 2018-01-11 NOTE — Progress Notes (Signed)
Patient: Kathy Farmer Female    DOB: [birth year]   62 y.o.   MRN: [id number] Visit Date: 01/11/2018  Today's Provider: Vernie Murders, PA   Chief Complaint  Patient presents with  . Ear Pain    Started 4 days ago.    Subjective:    Otalgia   There is pain in the right ear. This is a new problem. The current episode started in the past 7 days. The problem occurs constantly. The problem has been gradually worsening. There has been no fever. Associated symptoms include hearing loss and neck pain. Pertinent negatives include no abdominal pain, coughing, diarrhea, ear discharge, headaches, rash, rhinorrhea, sore throat or vomiting. She has tried heat packs and ear drops for the symptoms. The treatment provided no relief.      Past Medical History:  Diagnosis Date  . Anemia   . Depression   . Dysrhythmia    racing heart- neg workup  . GERD (gastroesophageal reflux disease)   . Sleep apnea    inconclusive sleep study   Past Surgical History:  Procedure Laterality Date  . ABDOMINAL HYSTERECTOMY    . CESAREAN SECTION    . DIAGNOSTIC LAPAROSCOPY    . DILATION AND CURETTAGE OF UTERUS  2011   with hysteroecopy  . TUBAL LIGATION Bilateral    Family History  Problem Relation Age of Onset  . Diabetes Father   . Hypertension Father   . Breast cancer Mother 6  . Osteoporosis Mother   . Breast cancer Maternal Aunt 80   Allergies  Allergen Reactions  . Vitamin B12 Other (See Comments)    Injection form only-had swollen in both eyes, nausea after getting second injection. Tolerates tablet form Injection form only-had swollen in both eyes, nausea after getting second injection. Tolerates tablet form    Current Outpatient Medications:  .  calcium-vitamin D (CALCIUM 500/D) 500-200 MG-UNIT tablet, Take by mouth., Disp: , Rfl:  .  DULoxetine (CYMBALTA) 60 MG capsule, TAKE ONE CAPSULE BY MOUTH DAILY, Disp: 30 capsule, Rfl: 12 .  hydrochlorothiazide (HYDRODIURIL) 25  MG tablet, Take 1 tablet (25 mg total) by mouth daily., Disp: 90 tablet, Rfl: 3 .  losartan-hydrochlorothiazide (HYZAAR) 50-12.5 MG tablet, Take 1 tablet by mouth daily., Disp: 90 tablet, Rfl: 3 .  metFORMIN (GLUCOPHAGE) 500 MG tablet, Take 1 tablet by mouth daily., Disp: , Rfl:  .  vitamin B-12 (CYANOCOBALAMIN) 1000 MCG tablet, Take 1 tablet (1,000 mcg total) by mouth daily., Disp: 30 tablet, Rfl: 0  Review of Systems  Constitutional: Negative.   HENT: Positive for ear pain and hearing loss. Negative for congestion, ear discharge, nosebleeds, postnasal drip, rhinorrhea, sinus pressure, sinus pain, sneezing, sore throat, tinnitus, trouble swallowing and voice change.   Eyes: Negative.   Respiratory: Negative.  Negative for cough.   Cardiovascular: Negative.   Gastrointestinal: Negative for abdominal pain, diarrhea and vomiting.  Musculoskeletal: Positive for neck pain.  Skin: Negative for rash.  Neurological: Negative for dizziness, light-headedness and headaches.   Social History   Tobacco Use  . Smoking status: Never Smoker  . Smokeless tobacco: Never Used  Substance Use Topics  . Alcohol use: Yes    Alcohol/week: 0.0 oz    Comment: on occasion    Objective:   BP 126/76 (BP Location: Right Arm, Patient Position: Sitting, Cuff Size: Large)   Pulse 72   Temp 98 F (36.7 C) (Oral)   Resp 16   Wt 213 lb (96.6 kg)  LMP 01/24/2011   BMI 30.56 kg/m  Vitals:   01/11/18 1329  BP: 126/76  Pulse: 72  Resp: 16  Temp: 98 F (36.7 C)  TempSrc: Oral  Weight: 213 lb (96.6 kg)   Physical Exam  Constitutional: She is oriented to person, place, and time. She appears well-developed and well-nourished. No distress.  HENT:  Head: Normocephalic and atraumatic.  Right Ear: Hearing normal.  Left Ear: Hearing normal.  Nose: Nose normal.  Slightly retracted right TM without redness or fluid lines.  Eyes: Conjunctivae and lids are normal. Right eye exhibits no discharge. Left eye  exhibits no discharge. No scleral icterus.  Cardiovascular: Normal rate and regular rhythm.  Pulmonary/Chest: Effort normal and breath sounds normal. No respiratory distress.  Musculoskeletal: Normal range of motion.  Neurological: She is alert and oriented to person, place, and time.  Skin: Skin is intact. No lesion and no rash noted.  Psychiatric: She has a normal mood and affect. Her speech is normal and behavior is normal. Thought content normal.      Assessment & Plan:     1. Dysfunction of right eustachian tube Onset with stopped up sensation and some discomfort in the right ear the past 4 days. No fever, sore throat, rhinorrhea or ear drainage. May use Claritin-D and Flonase for eustachian congestion. Recheck prn. Should restrict swimming (especially putting head under water).        Vernie Murders, PA  [facility]

## 2018-01-11 NOTE — Patient Instructions (Signed)
Eustachian Tube Dysfunction The eustachian tube connects the middle ear to the back of the nose. It regulates air pressure in the middle ear by allowing air to move between the ear and nose. It also helps to drain fluid from the middle ear space. When the eustachian tube does not function properly, air pressure, fluid, or both can build up in the middle ear. Eustachian tube dysfunction can affect one or both ears. What are the causes? This condition happens when the eustachian tube becomes blocked or cannot open normally. This may result from:  Ear infections.  Colds and other upper respiratory infections.  Allergies.  Irritation, such as from cigarette smoke or acid from the stomach coming up into the esophagus (gastroesophageal reflux).  Sudden changes in air pressure, such as from descending in an airplane.  Abnormal growths in the nose or throat, such as nasal polyps, tumors, or enlarged tissue at the back of the throat (adenoids).  What increases the risk? This condition may be more likely to develop in people who smoke and people who are overweight. Eustachian tube dysfunction may also be more likely to develop in children, especially children who have:  Certain birth defects of the mouth, such as cleft palate.  Large tonsils and adenoids.  What are the signs or symptoms? Symptoms of this condition may include:  A feeling of fullness in the ear.  Ear pain.  Clicking or popping noises in the ear.  Ringing in the ear.  Hearing loss.  Loss of balance.  Symptoms may get worse when the air pressure around you changes, such as when you travel to an area of high elevation or fly on an airplane. How is this diagnosed? This condition may be diagnosed based on:  Your symptoms.  A physical exam of your ear, nose, and throat.  Tests, such as those that measure: ? The movement of your eardrum (tympanogram). ? Your hearing (audiometry).  How is this treated? Treatment  depends on the cause and severity of your condition. If your symptoms are mild, you may be able to relieve your symptoms by moving air into ("popping") your ears. If you have symptoms of fluid in your ears, treatment may include:  Decongestants.  Antihistamines.  Nasal sprays or ear drops that contain medicines that reduce swelling (steroids).  In some cases, you may need to have a procedure to drain the fluid in your eardrum (myringotomy). In this procedure, a small tube is placed in the eardrum to:  Drain the fluid.  Restore the air in the middle ear space.  Follow these instructions at home:  Take over-the-counter and prescription medicines only as told by your health care provider.  Use techniques to help pop your ears as recommended by your health care provider. These may include: ? Chewing gum. ? Yawning. ? Frequent, forceful swallowing. ? Closing your mouth, holding your nose closed, and gently blowing as if you are trying to blow air out of your nose.  Do not do any of the following until your health care provider approves: ? Travel to high altitudes. ? Fly in airplanes. ? Work in a pressurized cabin or room. ? Scuba dive.  Keep your ears dry. Dry your ears completely after showering or bathing.  Do not smoke.  Keep all follow-up visits as told by your health care provider. This is important. Contact a health care provider if:  Your symptoms do not go away after treatment.  Your symptoms come back after treatment.  You are   unable to pop your ears.  You have: ? A fever. ? Pain in your ear. ? Pain in your head or neck. ? Fluid draining from your ear.  Your hearing suddenly changes.  You become very dizzy.  You lose your balance. This information is not intended to replace advice given to you by your health care provider. Make sure you discuss any questions you have with your health care provider. Document Released: 07/13/2015 Document Revised: 11/22/2015  Document Reviewed: 07/05/2014 Elsevier Interactive Patient Education  2018 [employer]  

## 2018-02-09 ENCOUNTER — Other Ambulatory Visit: Payer: Self-pay | Admitting: Family Medicine

## 2018-02-09 DIAGNOSIS — F329 Major depressive disorder, single episode, unspecified: Secondary | ICD-10-CM

## 2018-02-09 DIAGNOSIS — F32A Depression, unspecified: Secondary | ICD-10-CM

## 2018-03-15 DIAGNOSIS — M76821 Posterior tibial tendinitis, right leg: Secondary | ICD-10-CM | POA: Diagnosis not present

## 2018-04-01 DIAGNOSIS — E282 Polycystic ovarian syndrome: Secondary | ICD-10-CM | POA: Diagnosis not present

## 2018-04-01 DIAGNOSIS — I10 Essential (primary) hypertension: Secondary | ICD-10-CM | POA: Diagnosis not present

## 2018-04-08 ENCOUNTER — Encounter: Payer: Self-pay | Admitting: Family Medicine

## 2018-04-08 DIAGNOSIS — R7303 Prediabetes: Secondary | ICD-10-CM | POA: Diagnosis not present

## 2018-04-28 ENCOUNTER — Encounter: Payer: BLUE CROSS/BLUE SHIELD | Admitting: Family Medicine

## 2018-05-03 ENCOUNTER — Encounter: Payer: Self-pay | Admitting: Family Medicine

## 2018-05-03 ENCOUNTER — Ambulatory Visit: Payer: BLUE CROSS/BLUE SHIELD | Admitting: Family Medicine

## 2018-05-03 VITALS — BP 128/76 | HR 74 | Temp 98.4°F | Resp 16 | Wt 217.0 lb

## 2018-05-03 DIAGNOSIS — I10 Essential (primary) hypertension: Secondary | ICD-10-CM

## 2018-05-03 DIAGNOSIS — F324 Major depressive disorder, single episode, in partial remission: Secondary | ICD-10-CM | POA: Diagnosis not present

## 2018-05-03 DIAGNOSIS — E282 Polycystic ovarian syndrome: Secondary | ICD-10-CM | POA: Diagnosis not present

## 2018-05-03 NOTE — Progress Notes (Signed)
Patient: Kathy Farmer Female    DOB: 02-Dec-1955   62 y.o.   MRN: [id number] Visit Date: 05/03/2018  Today's Provider: Wilhemena Durie, MD   Chief Complaint  Patient presents with  . Hypertension   Subjective:    HPI  Hypertension, follow-up:  BP Readings from Last 3 Encounters:  05/03/18 128/76  01/11/18 126/76  10/06/17 120/80    She was last seen for hypertension 6 months ago.  BP at that visit was 120/80. Management since that visit includes no changes. She reports good compliance with treatment. She is not having side effects.  She is not exercising. She is adherent to low salt diet.   Outside blood pressures are. She is experiencing none.  Patient denies chest pressure/discomfort, irregular heart beat and lower extremity edema.   Cardiovascular risk factors include none.     Weight trend: stable Wt Readings from Last 3 Encounters:  05/03/18 217 lb (98.4 kg)  01/11/18 213 lb (96.6 kg)  10/06/17 211 lb (95.7 kg)    Current diet: well balanced    Allergies  Allergen Reactions  . Vitamin B12 Other (See Comments)    Injection form only-had swollen in both eyes, nausea after getting second injection. Tolerates tablet form Injection form only-had swollen in both eyes, nausea after getting second injection. Tolerates tablet form     Current Outpatient Medications:  .  calcium-vitamin D (CALCIUM 500/D) 500-200 MG-UNIT tablet, Take by mouth., Disp: , Rfl:  .  DULoxetine (CYMBALTA) 60 MG capsule, TAKE ONE CAPSULE BY MOUTH DAILY, Disp: 30 capsule, Rfl: 11 .  hydrochlorothiazide (HYDRODIURIL) 25 MG tablet, Take 1 tablet (25 mg total) by mouth daily., Disp: 90 tablet, Rfl: 3 .  losartan-hydrochlorothiazide (HYZAAR) 50-12.5 MG tablet, Take 1 tablet by mouth daily., Disp: 90 tablet, Rfl: 3 .  metFORMIN (GLUCOPHAGE) 500 MG tablet, Take 1 tablet by mouth daily., Disp: , Rfl:  .  vitamin B-12 (CYANOCOBALAMIN) 1000 MCG tablet, Take 1 tablet (1,000  mcg total) by mouth daily., Disp: 30 tablet, Rfl: 0  Review of Systems  Constitutional: Negative.   HENT: Negative.   Respiratory: Negative for cough and shortness of breath.   Cardiovascular: Negative for chest pain, palpitations and leg swelling.  Endocrine: Negative.   Musculoskeletal: Negative.   Allergic/Immunologic: Negative.   Neurological: Negative for dizziness, light-headedness and headaches.  Psychiatric/Behavioral: Negative.     Social History   Tobacco Use  . Smoking status: Never Smoker  . Smokeless tobacco: Never Used  Substance Use Topics  . Alcohol use: Yes    Alcohol/week: 0.0 standard drinks    Comment: on occasion    Objective:   BP 128/76 (BP Location: Left Arm, Patient Position: Sitting, Cuff Size: Large)   Pulse 74   Temp 98.4 F (36.9 C)   Resp 16   Wt 217 lb (98.4 kg)   LMP 01/24/2011   SpO2 96%   BMI 31.14 kg/m  Vitals:   05/03/18 1441  BP: 128/76  Pulse: 74  Resp: 16  Temp: 98.4 F (36.9 C)  SpO2: 96%  Weight: 217 lb (98.4 kg)     Physical Exam  Constitutional: She is oriented to person, place, and time. She appears well-developed and well-nourished.  HENT:  Head: Normocephalic and atraumatic.  Right Ear: External ear normal.  Left Ear: External ear normal.  Nose: Nose normal.  Eyes: Conjunctivae are normal. No scleral icterus.  Neck: No thyromegaly present.  Cardiovascular: Normal rate, regular rhythm  and normal heart sounds.  Pulmonary/Chest: Effort normal and breath sounds normal.  Abdominal: Soft.  Musculoskeletal: She exhibits no edema.  Neurological: She is alert and oriented to person, place, and time.  Skin: Skin is warm and dry.  Psychiatric: She has a normal mood and affect. Her behavior is normal. Judgment and thought content normal.        Assessment & Plan:     1. Benign essential HTN Stable  2. Bilateral polycystic ovarian syndrome Per Endocrine  3. Major depressive disorder with single episode, in  partial remission (HCC) Stable--RTC 6 months.  I have done the exam and reviewed the chart and it is accurate to the best of my knowledge. Development worker, community has been used and  any errors in dictation or transcription are unintentional. Miguel Aschoff M.D. [place], MD  [facility]

## 2018-05-11 ENCOUNTER — Ambulatory Visit (INDEPENDENT_AMBULATORY_CARE_PROVIDER_SITE_OTHER): Payer: BLUE CROSS/BLUE SHIELD | Admitting: Family Medicine

## 2018-05-11 DIAGNOSIS — Z23 Encounter for immunization: Secondary | ICD-10-CM | POA: Diagnosis not present

## 2018-05-12 ENCOUNTER — Other Ambulatory Visit: Payer: Self-pay | Admitting: Family Medicine

## 2018-05-12 DIAGNOSIS — I10 Essential (primary) hypertension: Secondary | ICD-10-CM

## 2018-06-10 DIAGNOSIS — L538 Other specified erythematous conditions: Secondary | ICD-10-CM | POA: Diagnosis not present

## 2018-06-10 DIAGNOSIS — L820 Inflamed seborrheic keratosis: Secondary | ICD-10-CM | POA: Diagnosis not present

## 2018-06-10 DIAGNOSIS — L821 Other seborrheic keratosis: Secondary | ICD-10-CM | POA: Diagnosis not present

## 2018-07-16 DIAGNOSIS — L538 Other specified erythematous conditions: Secondary | ICD-10-CM | POA: Diagnosis not present

## 2018-07-16 DIAGNOSIS — L820 Inflamed seborrheic keratosis: Secondary | ICD-10-CM | POA: Diagnosis not present

## 2018-07-22 ENCOUNTER — Ambulatory Visit (INDEPENDENT_AMBULATORY_CARE_PROVIDER_SITE_OTHER): Payer: BLUE CROSS/BLUE SHIELD | Admitting: Family Medicine

## 2018-07-22 VITALS — BP 132/78 | HR 71 | Temp 98.2°F | Resp 16 | Ht 70.0 in | Wt 224.0 lb

## 2018-07-22 DIAGNOSIS — Z0000 Encounter for general adult medical examination without abnormal findings: Secondary | ICD-10-CM | POA: Diagnosis not present

## 2018-07-22 DIAGNOSIS — G4733 Obstructive sleep apnea (adult) (pediatric): Secondary | ICD-10-CM

## 2018-07-22 DIAGNOSIS — Z780 Asymptomatic menopausal state: Secondary | ICD-10-CM

## 2018-07-22 DIAGNOSIS — Z1211 Encounter for screening for malignant neoplasm of colon: Secondary | ICD-10-CM | POA: Diagnosis not present

## 2018-07-22 DIAGNOSIS — E282 Polycystic ovarian syndrome: Secondary | ICD-10-CM

## 2018-07-22 DIAGNOSIS — E6609 Other obesity due to excess calories: Secondary | ICD-10-CM

## 2018-07-22 DIAGNOSIS — I10 Essential (primary) hypertension: Secondary | ICD-10-CM

## 2018-07-22 DIAGNOSIS — Z6832 Body mass index (BMI) 32.0-32.9, adult: Secondary | ICD-10-CM

## 2018-07-22 NOTE — Progress Notes (Signed)
Patient: Kathy Farmer, Female    DOB: [birth year], 63 y.o.   MRN: [id number] Visit Date: 07/22/2018  Today's Provider: Wilhemena Durie, MD   Chief Complaint  Patient presents with  . Annual Exam   Subjective:  Kathy Farmer is a 63 y.o. female who presents today for health maintenance and complete physical. She feels fairly well. She reports exercising 2 times weekly. She reports she is sleeping poorly.  11/11/2009Colonoscopy, [place]-internal hemorrhoids 10/13/17 Mammogram-negative 01/21/2010 Pap-S/P hysterectomy  Review of Systems  Constitutional: Negative.        Crying, irritability  HENT: Negative.   Eyes: Negative.   Respiratory: Negative.   Cardiovascular: Negative.   Gastrointestinal: Negative.        Heartburn/acid reflux  Endocrine: Positive for cold intolerance.  Genitourinary: Positive for enuresis.  Musculoskeletal: Positive for arthralgias and back pain.  Skin: Positive for color change (hands and feet turn purple and really hurt if cold or quick change in temperature).  Allergic/Immunologic: Negative.   Neurological: Negative.   Hematological: Negative.   Psychiatric/Behavioral: Positive for agitation and decreased concentration. The patient is nervous/anxious.        For 2 moths does not feel like the antidepressant is helping    Social History   Socioeconomic History  . Marital status: Married    Spouse name: Not on file  . Number of children: 2  . Years of education: Not on file  . Highest education level: Not on file  Occupational History    Employer: [employer]  . Financial resource strain: Not on file  . Food insecurity:    Worry: Not on file    Inability: Not on file  . Transportation needs:    Medical: Not on file    Non-medical: Not on file  Tobacco Use  . Smoking status: Never Smoker  . Smokeless tobacco: Never Used  Substance and Sexual Activity  . Alcohol use: Yes    Alcohol/week:  0.0 standard drinks    Comment: on occasion   . Drug use: No  . Sexual activity: Yes    Partners: Male  Lifestyle  . Physical activity:    Days per week: Not on file    Minutes per session: Not on file  . Stress: Not on file  Relationships  . Social connections:    Talks on phone: Not on file    Gets together: Not on file    Attends religious service: Not on file    Active member of club or organization: Not on file    Attends meetings of clubs or organizations: Not on file    Relationship status: Not on file  . Intimate partner violence:    Fear of current or ex partner: Not on file    Emotionally abused: Not on file    Physically abused: Not on file    Forced sexual activity: Not on file  Other Topics Concern  . Not on file  Social History Narrative  . Not on file    Patient Active Problem List   Diagnosis Date Noted  . B12 deficiency 11/20/2016  . Adaptation reaction 07/12/2015  . Absolute anemia 07/12/2015  . Clinical depression 07/12/2015  . Diabetes ([place]) 07/12/2015  . Elevation of level of transaminase or lactic acid dehydrogenase (LDH) 07/12/2015  . Acid reflux 07/12/2015  . Benign essential HTN 07/12/2015  . Adiposity 07/12/2015  . Apnea, sleep 07/12/2015  . Tension type headache 07/12/2015  . Thoracic  outlet syndrome 07/12/2015  . Raynaud's phenomenon 12/15/2014  . Leg cramps, sleep related 12/15/2014  . Bleeding gums 12/15/2014  . Infraspinatus tenosynovitis 07/27/2014  . Impingement syndrome of shoulder 07/27/2014  . Closed fracture of glenoid cavity of scapula 07/27/2014  . Shoulder subluxation 07/27/2014  . Posterior tibial tendinitis 07/03/2014  . Abnormal weight gain 03/16/2014  . Bilateral polycystic ovarian syndrome 03/16/2014    Past Surgical History:  Procedure Laterality Date  . ABDOMINAL HYSTERECTOMY    . CESAREAN SECTION    . DIAGNOSTIC LAPAROSCOPY    . DILATION AND CURETTAGE OF UTERUS  2011   with hysteroecopy  . TUBAL LIGATION  Bilateral     Her family history includes Breast cancer (age of onset: 24) in her mother; Breast cancer (age of onset: 55) in her maternal aunt; Diabetes in her father; Hypertension in her father; Osteoporosis in her mother.     Outpatient Encounter Medications as of 07/22/2018  Medication Sig Note  . calcium-vitamin D (CALCIUM 500/D) 500-200 MG-UNIT tablet Take by mouth. 07/12/2015: Received from: [facility]  . DULoxetine (CYMBALTA) 60 MG capsule TAKE ONE CAPSULE BY MOUTH DAILY   . losartan-hydrochlorothiazide (HYZAAR) 50-12.5 MG tablet TAKE ONE TABLET BY MOUTH DAILY   . metFORMIN (GLUCOPHAGE) 500 MG tablet Take 1 tablet by mouth daily. 12/15/2014: Received from: [facility]: Take by mouth.  . vitamin B-12 (CYANOCOBALAMIN) 1000 MCG tablet Take 1 tablet (1,000 mcg total) by mouth daily. (Patient not taking: Reported on 07/22/2018)   . [DISCONTINUED] hydrochlorothiazide (HYDRODIURIL) 25 MG tablet Take 1 tablet (25 mg total) by mouth daily.    No facility-administered encounter medications on file as of 07/22/2018.     Patient Care Team: Jerrol Banana., MD as PCP - General (Family Medicine)      Objective:   Vitals:  Vitals:   07/22/18 1104  BP: 132/78  Pulse: 71  Resp: 16  Temp: 98.2 F (36.8 C)  TempSrc: Oral  SpO2: 96%  Weight: 224 lb (101.6 kg)  Height: 5\' 10"  (1.778 m)    Physical Exam Constitutional:      Appearance: Normal appearance. She is normal weight.  HENT:     Head: Normocephalic and atraumatic.     Right Ear: Tympanic membrane, ear canal and external ear normal.     Left Ear: Tympanic membrane, ear canal and external ear normal.     Nose: Nose normal.     Mouth/Throat:     Mouth: Mucous membranes are moist.     Pharynx: Oropharynx is clear.  Eyes:     Extraocular Movements: Extraocular movements intact.     Conjunctiva/sclera: Conjunctivae normal.     Pupils: Pupils are equal, round, and reactive to  light.  Neck:     Musculoskeletal: Normal range of motion and neck supple.  Cardiovascular:     Rate and Rhythm: Normal rate and regular rhythm.     Pulses: Normal pulses.     Heart sounds: Normal heart sounds.  Pulmonary:     Effort: Pulmonary effort is normal.     Breath sounds: Normal breath sounds.  Abdominal:     General: Abdomen is flat. Bowel sounds are normal.     Palpations: Abdomen is soft.  Musculoskeletal: Normal range of motion.  Skin:    General: Skin is warm and dry.  Neurological:     General: No focal deficit present.     Mental Status: She is alert and oriented to  person, place, and time. Mental status is at baseline.  Psychiatric:        Mood and Affect: Mood normal.        Behavior: Behavior normal.        Thought Content: Thought content normal.        Judgment: Judgment normal.    Fall Risk  07/22/2018 01/17/2016 12/15/2014  Falls in the past year? 0 No Yes  Number falls in past yr: - - 1  Injury with Fall? - - Yes   Depression Screen PHQ 2/9 Scores 07/22/2018 01/20/2017 01/17/2016 07/12/2015  PHQ - 2 Score 3 2 3 6   PHQ- 9 Score 15 9 10 20    Functional Status Survey: Is the patient deaf or have difficulty hearing?: No Does the patient have difficulty seeing, even when wearing glasses/contacts?: No Does the patient have difficulty concentrating, remembering, or making decisions?: Yes Does the patient have difficulty walking or climbing stairs?: No Does the patient have difficulty dressing or bathing?: No Does the patient have difficulty doing errands alone such as visiting a doctor's office or shopping?: No    Office Visit from 07/22/2018 in [place]  AUDIT-C Score  2      Assessment & Plan:     Routine Health Maintenance and Physical Exam  Exercise Activities and Dietary recommendations Goals   None     Immunization History  Administered Date(s) Administered  . Influenza,inj,Quad PF,6+ Mos 04/07/2017, 05/11/2018  .  Influenza-Unspecified 05/01/2015  . Tdap 07/01/2007    Health Maintenance  Topic Date Due  . PNEUMOCOCCAL POLYSACCHARIDE VACCINE AGE 54-64 HIGH RISK  08/10/1958  . FOOT EXAM  08/10/1966  . OPHTHALMOLOGY EXAM  08/10/1966  . HIV Screening  08/11/1971  . PAP SMEAR-Modifier  01/21/2013  . HEMOGLOBIN A1C  04/03/2017  . TETANUS/TDAP  06/30/2017  . COLONOSCOPY  05/10/2018  . MAMMOGRAM  10/14/2019  . INFLUENZA VACCINE  Completed  . Hepatitis C Screening  Completed     Discussed health benefits of physical activity, and encouraged her to engage in regular exercise appropriate for her age and condition.  1. Annual physical exam  - CBC with Differential/Platelet - Comprehensive metabolic panel - Lipid Panel With LDL/HDL Ratio - TSH  2. Colon cancer screening  - Ambulatory referral to Gastroenterology  3. PCOD (polycystic ovarian disease)  - Ambulatory referral to Endocrinology - DG Bone Density; Future  4. Post-menopausal  - DG Bone Density; Future  5. Obstructive sleep apnea syndrome   6. Benign essential HTN   7. MDD Add Wellbutrin XL 150 mg daily return to clinic 1 to 2 months.  Patient is not suicidal.  8. Class 1 obesity due to excess calories without serious comorbidity with body mass index (BMI) of 32.0 to 32.9 in adult  I have done the exam and reviewed the chart and it is accurate to the best of my knowledge. Development worker, community has been used and  any errors in dictation or transcription are unintentional. Miguel Aschoff M.D. [facility]

## 2018-07-23 LAB — LIPID PANEL WITH LDL/HDL RATIO
Cholesterol, Total: 171 mg/dL (ref 100–199)
HDL: 39 mg/dL — ABNORMAL LOW (ref >39)
LDL Calculated: 105 mg/dL — ABNORMAL HIGH (ref 0–99)
LDl/HDL Ratio: 2.7 ratio (ref 0.0–3.2)
Triglycerides: 135 mg/dL (ref 0–149)
VLDL Cholesterol Cal: 27 mg/dL (ref 5–40)

## 2018-07-23 LAB — CBC WITH DIFFERENTIAL/PLATELET
Basophils Absolute: 0 10*3/uL (ref 0.0–0.2)
Basos: 0 %
EOS (ABSOLUTE): 0.2 10*3/uL (ref 0.0–0.4)
Eos: 3 %
Hematocrit: 36.2 % (ref 34.0–46.6)
Hemoglobin: 12.7 g/dL (ref 11.1–15.9)
Immature Grans (Abs): 0 10*3/uL (ref 0.0–0.1)
Immature Granulocytes: 0 %
Lymphocytes Absolute: 2 10*3/uL (ref 0.7–3.1)
Lymphs: 29 %
MCH: 31.6 pg (ref 26.6–33.0)
MCHC: 35.1 g/dL (ref 31.5–35.7)
MCV: 90 fL (ref 79–97)
Monocytes Absolute: 0.5 10*3/uL (ref 0.1–0.9)
Monocytes: 7 %
Neutrophils Absolute: 4.2 10*3/uL (ref 1.4–7.0)
Neutrophils: 61 %
Platelets: 249 10*3/uL (ref 150–450)
RBC: 4.02 x10E6/uL (ref 3.77–5.28)
RDW: 12.7 % (ref 11.7–15.4)
WBC: 6.8 10*3/uL (ref 3.4–10.8)

## 2018-07-23 LAB — COMPREHENSIVE METABOLIC PANEL
ALT: 23 IU/L (ref 0–32)
AST: 20 IU/L (ref 0–40)
Albumin/Globulin Ratio: 2.1 (ref 1.2–2.2)
Albumin: 4.5 g/dL (ref 3.8–4.8)
Alkaline Phosphatase: 65 IU/L (ref 39–117)
BUN/Creatinine Ratio: 18 (ref 12–28)
BUN: 21 mg/dL (ref 8–27)
Bilirubin Total: 0.5 mg/dL (ref 0.0–1.2)
CO2: 24 mmol/L (ref 20–29)
Calcium: 9.5 mg/dL (ref 8.7–10.3)
Chloride: 100 mmol/L (ref 96–106)
Creatinine, Ser: 1.18 mg/dL — ABNORMAL HIGH (ref 0.57–1.00)
GFR calc Af Amer: 58 mL/min/{1.73_m2} — ABNORMAL LOW (ref >59)
GFR calc non Af Amer: 50 mL/min/{1.73_m2} — ABNORMAL LOW (ref >59)
Globulin, Total: 2.1 g/dL (ref 1.5–4.5)
Glucose: 87 mg/dL (ref 65–99)
Potassium: 4.2 mmol/L (ref 3.5–5.2)
Sodium: 143 mmol/L (ref 134–144)
Total Protein: 6.6 g/dL (ref 6.0–8.5)

## 2018-07-23 LAB — TSH: TSH: 2.26 u[IU]/mL (ref 0.450–4.500)

## 2018-07-29 ENCOUNTER — Telehealth: Payer: Self-pay

## 2018-07-29 NOTE — Telephone Encounter (Signed)
-----   Message from Jerrol Banana., MD sent at 07/29/2018 12:58 PM EST ----- Labs stable.  Mild decrease in kidney function.  Stay hydrated.  Avoid anti-inflammatory drugs.  Will recheck on next visit.

## 2018-07-29 NOTE — Telephone Encounter (Signed)
LMTCB

## 2018-07-30 NOTE — Telephone Encounter (Signed)
Kathy Farmer  ED

## 2018-08-03 ENCOUNTER — Encounter: Payer: Self-pay | Admitting: Family Medicine

## 2018-08-03 MED ORDER — BUPROPION HCL 75 MG PO TABS: 75.0000 mg | ORAL_TABLET | Freq: Two times a day (BID) | ORAL | 5 refills | Status: DC

## 2018-08-27 DIAGNOSIS — Z1211 Encounter for screening for malignant neoplasm of colon: Secondary | ICD-10-CM | POA: Diagnosis not present

## 2018-08-27 DIAGNOSIS — K219 Gastro-esophageal reflux disease without esophagitis: Secondary | ICD-10-CM | POA: Diagnosis not present

## 2018-09-16 DIAGNOSIS — K2950 Unspecified chronic gastritis without bleeding: Secondary | ICD-10-CM | POA: Diagnosis not present

## 2018-09-16 DIAGNOSIS — K210 Gastro-esophageal reflux disease with esophagitis: Secondary | ICD-10-CM | POA: Diagnosis not present

## 2018-09-16 DIAGNOSIS — K3189 Other diseases of stomach and duodenum: Secondary | ICD-10-CM | POA: Diagnosis not present

## 2018-09-16 DIAGNOSIS — K2970 Gastritis, unspecified, without bleeding: Secondary | ICD-10-CM | POA: Diagnosis not present

## 2018-09-16 DIAGNOSIS — K640 First degree hemorrhoids: Secondary | ICD-10-CM | POA: Diagnosis not present

## 2018-09-16 DIAGNOSIS — K219 Gastro-esophageal reflux disease without esophagitis: Secondary | ICD-10-CM | POA: Diagnosis not present

## 2018-09-16 DIAGNOSIS — Z1211 Encounter for screening for malignant neoplasm of colon: Secondary | ICD-10-CM | POA: Diagnosis not present

## 2018-09-16 DIAGNOSIS — K5730 Diverticulosis of large intestine without perforation or abscess without bleeding: Secondary | ICD-10-CM | POA: Diagnosis not present

## 2018-09-16 LAB — HM COLONOSCOPY

## 2018-09-21 ENCOUNTER — Ambulatory Visit: Payer: Self-pay | Admitting: Family Medicine

## 2018-10-06 ENCOUNTER — Ambulatory Visit: Payer: Self-pay | Admitting: Family Medicine

## 2018-10-19 ENCOUNTER — Telehealth: Payer: Self-pay

## 2018-10-19 MED ORDER — OSELTAMIVIR PHOSPHATE 75 MG PO CAPS: 75.0000 mg | ORAL_CAPSULE | Freq: Every day | ORAL | 0 refills | Status: AC

## 2018-10-19 NOTE — Telephone Encounter (Signed)
Tamiflu sent in for prophylactic treatment as husband has possible flu.

## 2018-11-30 ENCOUNTER — Ambulatory Visit: Payer: Self-pay | Admitting: Family Medicine

## 2019-01-17 ENCOUNTER — Ambulatory Visit (INDEPENDENT_AMBULATORY_CARE_PROVIDER_SITE_OTHER): Payer: BC Managed Care – PPO | Admitting: Family Medicine

## 2019-01-17 ENCOUNTER — Encounter: Payer: Self-pay | Admitting: Family Medicine

## 2019-01-17 ENCOUNTER — Other Ambulatory Visit: Payer: Self-pay

## 2019-01-17 VITALS — BP 126/74 | HR 84 | Temp 99.0°F | Resp 16

## 2019-01-17 DIAGNOSIS — F324 Major depressive disorder, single episode, in partial remission: Secondary | ICD-10-CM

## 2019-01-17 NOTE — Progress Notes (Signed)
Patient: Kathy Farmer Female    DOB: 03/05/1956   63 y.o.   MRN: [id number] Visit Date: 01/17/2019  Today's Provider: Wilhemena Durie, MD   Chief Complaint  Patient presents with  . Hypertension  . Depression   Subjective:    HPI Patient comes in today for a follow up. She was last seen in the office 6 months ago. She was started on bupropion 75mg  BID. She reports that she has not noticed an improvement in her mood until about 2-3 weeks ago. She reports that she is tolerating the medication well. She is pleased with how she feels now. Especially true as her younger brother died unexpectedly in 2022-10-18 then her mother died in 18-Nov-2022. Depression screen [facility] 2/9 01/17/2019 07/22/2018 01/20/2017  Decreased Interest 0 1 1  Down, Depressed, Hopeless 0 2 1  PHQ - 2 Score 0 3 2  Altered sleeping 3 3 1   Tired, decreased energy 0 2 0  Change in appetite 0 2 1  Feeling bad or failure about yourself  0 2 2  Trouble concentrating 2 3 3   Moving slowly or fidgety/restless 0 0 0  Suicidal thoughts 0 0 0  PHQ-9 Score 5 15 9   Difficult doing work/chores Somewhat difficult Very difficult -    Allergies  Allergen Reactions  . Vitamin B12 Other (See Comments)    Injection form only-had swollen in both eyes, nausea after getting second injection. Tolerates tablet form Injection form only-had swollen in both eyes, nausea after getting second injection. Tolerates tablet form     Current Outpatient Medications:  .  buPROPion (WELLBUTRIN) 75 MG tablet, Take 1 tablet (75 mg total) by mouth 2 (two) times daily., Disp: 60 tablet, Rfl: 5 .  DULoxetine (CYMBALTA) 60 MG capsule, TAKE ONE CAPSULE BY MOUTH DAILY, Disp: 30 capsule, Rfl: 11 .  losartan-hydrochlorothiazide (HYZAAR) 50-12.5 MG tablet, TAKE ONE TABLET BY MOUTH DAILY, Disp: 90 tablet, Rfl: 2 .  metFORMIN (GLUCOPHAGE) 500 MG tablet, Take 1 tablet by mouth daily., Disp: , Rfl:  .  calcium-vitamin D (CALCIUM 500/D) 500-200 MG-UNIT  tablet, Take by mouth., Disp: , Rfl:  .  vitamin B-12 (CYANOCOBALAMIN) 1000 MCG tablet, Take 1 tablet (1,000 mcg total) by mouth daily. (Patient not taking: Reported on 07/22/2018), Disp: 30 tablet, Rfl: 0  Review of Systems  Constitutional: Positive for fatigue. Negative for activity change, diaphoresis, fever and unexpected weight change.  Respiratory: Negative for cough and shortness of breath.   Cardiovascular: Negative for chest pain, palpitations and leg swelling.  Neurological: Negative for dizziness, light-headedness and headaches.  Psychiatric/Behavioral: Positive for decreased concentration. Negative for agitation, self-injury, sleep disturbance and suicidal ideas. The patient is not nervous/anxious.     Social History   Tobacco Use  . Smoking status: Never Smoker  . Smokeless tobacco: Never Used  Substance Use Topics  . Alcohol use: Yes    Alcohol/week: 0.0 standard drinks    Comment: on occasion       Objective:   BP 126/74   Pulse 84   Temp 99 F (37.2 C)   Resp 16   LMP 01/24/2011   SpO2 96%  Vitals:   01/17/19 0856  BP: 126/74  Pulse: 84  Resp: 16  Temp: 99 F (37.2 C)  SpO2: 96%     Physical Exam Vitals signs reviewed.  Constitutional:      Appearance: She is well-developed.  HENT:     Head: Normocephalic and atraumatic.  Right Ear: External ear normal.     Left Ear: External ear normal.     Nose: Nose normal.  Eyes:     General: No scleral icterus.    Conjunctiva/sclera: Conjunctivae normal.  Neck:     Thyroid: No thyromegaly.  Cardiovascular:     Rate and Rhythm: Normal rate and regular rhythm.     Heart sounds: Normal heart sounds.  Pulmonary:     Effort: Pulmonary effort is normal.     Breath sounds: Normal breath sounds.  Abdominal:     Palpations: Abdomen is soft.  Skin:    General: Skin is warm and dry.  Neurological:     Mental Status: She is alert and oriented to person, place, and time.  Psychiatric:        Behavior:  Behavior normal.        Thought Content: Thought content normal.        Judgment: Judgment normal.      No results found for any visits on 01/17/19.     Assessment & Plan    1. Major depressive disorder with single episode, in partial remission ([place]) Improved despite difficult family losses. RTC late fall.     Natiya Seelinger [place] Mon, MD  [facility]

## 2019-02-15 ENCOUNTER — Other Ambulatory Visit: Payer: Self-pay | Admitting: Family Medicine

## 2019-02-15 DIAGNOSIS — F329 Major depressive disorder, single episode, unspecified: Secondary | ICD-10-CM

## 2019-02-15 DIAGNOSIS — I10 Essential (primary) hypertension: Secondary | ICD-10-CM

## 2019-02-15 DIAGNOSIS — F32A Depression, unspecified: Secondary | ICD-10-CM

## 2019-05-09 ENCOUNTER — Encounter: Payer: Self-pay | Admitting: Family Medicine

## 2019-05-09 NOTE — Telephone Encounter (Signed)
Ok to place an order for this?

## 2019-06-02 DIAGNOSIS — D2261 Melanocytic nevi of right upper limb, including shoulder: Secondary | ICD-10-CM | POA: Diagnosis not present

## 2019-06-02 DIAGNOSIS — D225 Melanocytic nevi of trunk: Secondary | ICD-10-CM | POA: Diagnosis not present

## 2019-06-02 DIAGNOSIS — D2272 Melanocytic nevi of left lower limb, including hip: Secondary | ICD-10-CM | POA: Diagnosis not present

## 2019-06-02 DIAGNOSIS — L538 Other specified erythematous conditions: Secondary | ICD-10-CM | POA: Diagnosis not present

## 2019-06-02 DIAGNOSIS — D2262 Melanocytic nevi of left upper limb, including shoulder: Secondary | ICD-10-CM | POA: Diagnosis not present

## 2019-06-02 DIAGNOSIS — L820 Inflamed seborrheic keratosis: Secondary | ICD-10-CM | POA: Diagnosis not present

## 2019-06-02 DIAGNOSIS — R208 Other disturbances of skin sensation: Secondary | ICD-10-CM | POA: Diagnosis not present

## 2019-06-14 ENCOUNTER — Ambulatory Visit
Admission: RE | Admit: 2019-06-14 | Discharge: 2019-06-14 | Disposition: A | Discharge status: Home / Self Care | Payer: BC Managed Care – PPO | Source: Ambulatory Visit | Attending: Family Medicine | Admitting: Family Medicine

## 2019-06-14 ENCOUNTER — Other Ambulatory Visit: Payer: Self-pay | Admitting: Family Medicine

## 2019-06-14 DIAGNOSIS — Z1231 Encounter for screening mammogram for malignant neoplasm of breast: Secondary | ICD-10-CM | POA: Insufficient documentation

## 2019-06-15 ENCOUNTER — Other Ambulatory Visit: Payer: Self-pay | Admitting: Family Medicine

## 2019-06-15 DIAGNOSIS — R928 Other abnormal and inconclusive findings on diagnostic imaging of breast: Secondary | ICD-10-CM

## 2019-06-15 DIAGNOSIS — N6489 Other specified disorders of breast: Secondary | ICD-10-CM

## 2019-06-21 ENCOUNTER — Ambulatory Visit
Admission: RE | Admit: 2019-06-21 | Discharge: 2019-06-21 | Disposition: A | Discharge status: Home / Self Care | Payer: BC Managed Care – PPO | Source: Ambulatory Visit | Attending: Family Medicine | Admitting: Family Medicine

## 2019-06-21 DIAGNOSIS — R928 Other abnormal and inconclusive findings on diagnostic imaging of breast: Secondary | ICD-10-CM | POA: Diagnosis not present

## 2019-06-21 DIAGNOSIS — N6489 Other specified disorders of breast: Secondary | ICD-10-CM

## 2019-06-28 ENCOUNTER — Telehealth: Payer: BC Managed Care – PPO | Admitting: Physician Assistant

## 2019-06-28 DIAGNOSIS — R059 Cough, unspecified: Secondary | ICD-10-CM

## 2019-06-28 DIAGNOSIS — R0981 Nasal congestion: Secondary | ICD-10-CM

## 2019-06-28 DIAGNOSIS — J029 Acute pharyngitis, unspecified: Secondary | ICD-10-CM

## 2019-06-28 DIAGNOSIS — R05 Cough: Secondary | ICD-10-CM

## 2019-06-28 MED ORDER — PHENOL 1.4 % MT LIQD: 1.0000 | OROMUCOSAL | 0 refills | Status: DC | PRN

## 2019-06-28 MED ORDER — FLUTICASONE PROPIONATE 50 MCG/ACT NA SUSP: 2.0000 | Freq: Every day | NASAL | 0 refills | Status: DC

## 2019-06-28 MED ORDER — BENZONATATE 100 MG PO CAPS: 100.0000 mg | ORAL_CAPSULE | Freq: Two times a day (BID) | ORAL | 0 refills | Status: DC | PRN

## 2019-06-28 NOTE — Progress Notes (Signed)
E-Visit for Corona Virus Screening   Your current symptoms could be consistent with the coronavirus.  Many health care providers can now test patients at their office but not all are.  [facility] has multiple testing sites. For information on our [place] testing locations and hours go to [email or web address]  We are enrolling you in our [place] for [place] . Daily you will receive a questionnaire within the [place] website. Our COVID 19 response team will be monitoring your responses daily.  You can use medication such as A prescription cough medication called Tessalon Perles 100 mg. You may take 1-2 capsules every 8 hours as needed for cough. Fluticasone nasal spray for nasal congestion. Phenol spray for sore throat but can also try warm water salt gargles, warm tea, honey, lozenges, etc.   You may also take acetaminophen (Tylenol) as needed for fever. 1-2 tablets every 6 hours as needed for pain or fever.    Testing Information: The COVID-19 Community Testing sites will begin testing BY APPOINTMENT ONLY.  You can schedule online at [email or web address]  If you do not have access to a smart phone or computer you may call [phone] for an appointment.  Testing Locations: Appointment schedule is 8 am to 3:30 pm at all sites  [facility] indoors at [address], [place] [address] [facility]  indoors at [place]. [address] [place], [place] [address] [place] indoors at [address] [place] [address]  Additional testing sites in the Community:  . For [facility] Testing sites in [facility]  FaceUpdate.uy  . For Pop-up testing sites in [place]  BowlDirectory.co.uk  . For Testing sites with regular hours https://onsms.org/[email or web address]  . For [place] [email or web address]  . For  [facility] [email or web address]  . For [facility] testing in [place] and [employer] [email or web address]  . For [facility] testing in [facility]   https://lhi.care/[email or web address]  For  more information about community testing call [phone]   We are enrolling you in our [place] for [place] . Daily you will receive a questionnaire within the [place] website. Our COVID 19 response team will be monitoring your responses daily.  Please quarantine yourself while awaiting your test results. If you develop fever/cough/breathlessness, please stay home for 10 days with improving symptoms and until you have had 24 hours of no fever (without taking a fever reducer).  You should wear a mask or cloth face covering over your nose and mouth if you must be around other people or animals, including pets (even at home). Try to stay at least 6 feet away from other people. This will protect the people around you.  Please continue good preventive care measures, including:  frequent hand-washing, avoid touching your face, cover coughs/sneezes, stay out of crowds and keep a 6 foot distance from others.  COVID-19 is a respiratory illness with symptoms that are similar to the flu. Symptoms are typically mild to moderate, but there have been cases of severe illness and death due to the virus.   The following symptoms may appear 2-14 days after exposure: . Fever . Cough . Shortness of breath or difficulty breathing . Chills . Repeated shaking with chills . Muscle pain . Headache . Sore throat . New loss of taste or smell . Fatigue . Congestion or runny nose . Nausea or vomiting . Diarrhea  Go to the nearest hospital ED for assessment if fever/cough/breathlessness are  severe or illness seems like a  threat to life.  It is vitally important that if you feel that you have an infection such as this virus or any other virus that you stay home and away from places where you may spread it to others.  You should avoid contact with people age 50 and older.     Reduce your risk of any infection by using the same precautions used for avoiding the common cold or flu:  [place] Kitchen Wash your hands often with soap and warm water for at least 20 seconds.  If soap and water are not readily available, use an alcohol-based hand sanitizer with at least 60% alcohol.  . If coughing or sneezing, cover your mouth and nose by coughing or sneezing into the elbow areas of your shirt or coat, into a tissue or into your sleeve (not your hands). . Avoid shaking hands with others and consider head nods or verbal greetings only. . Avoid touching your eyes, nose, or mouth with unwashed hands.  . Avoid close contact with people who are sick. . Avoid places or events with large numbers of people in one location, like concerts or sporting events. . Carefully consider travel plans you have or are making. . If you are planning any travel outside or inside the [place], visit the CDC's Travelers' Health webpage for the latest health notices. . If you have some symptoms but not all symptoms, continue to monitor at home and seek medical attention if your symptoms worsen. . If you are having a medical emergency, call 911.  HOME CARE . Only take medications as instructed by your medical team. . Drink plenty of fluids and get plenty of rest. . A steam or ultrasonic humidifier can help if you have congestion.   GET HELP RIGHT AWAY IF YOU HAVE EMERGENCY WARNING SIGNS** FOR COVID-19. If you or someone is showing any of these signs seek emergency medical care immediately. Call 911 or proceed to your closest emergency facility if: . You develop worsening high fever. . Trouble breathing . Bluish lips or  face . Persistent pain or pressure in the chest . New confusion . Inability to wake or stay awake . You cough up blood. . Your symptoms become more severe  **This list is not all possible symptoms. Contact your medical provider for any symptoms that are sever or concerning to you.  MAKE SURE YOU   Understand these instructions.  Will watch your condition.  Will get help right away if you are not doing well or get worse.  Your e-visit answers were reviewed by a board certified advanced clinical practitioner to complete your personal care plan.  Depending on the condition, your plan could have included both over the counter or prescription medications.  If there is a problem please reply once you have received a response from your provider.  Your safety is important to [place].  If you have drug allergies check your prescription carefully.    You can use MyChart to ask questions about today's visit, request a non-urgent call back, or ask for a work or school excuse for 24 hours related to this e-Visit. If it has been greater than 24 hours you will need to follow up with your provider, or enter a new e-Visit to address those concerns. You will get an e-mail in the next two days asking about your experience.  I hope that your e-visit has been valuable and will speed your recovery. Thank you for using e-visits.  Greater than 5 minutes, yet less  than 10 minutes of time have been spent researching, coordinating, and implementing care for this patient today.

## 2019-07-04 ENCOUNTER — Other Ambulatory Visit: Payer: BC Managed Care – PPO

## 2019-07-04 ENCOUNTER — Ambulatory Visit: Payer: BC Managed Care – PPO

## 2019-07-22 NOTE — Progress Notes (Signed)
Patient: Kathy Farmer, Female    DOB: 01/16/1956, 64 y.o.   MRN: [id number] Visit Date: 07/27/2019  Today's Provider: Wilhemena Durie, MD   Chief Complaint  Patient presents with  . Annual Exam   Subjective:     Annual physical exam Kathy Farmer is a 64 y.o. female who presents today for health maintenance and complete physical. She feels well. She reports she is exercising at least 3 days a week. She reports she is sleeping fairly well.Married mother of 2. She sees dr Sabra Heck for Gyn care. Her depression is 80% improved.  -----------------------------------------------------------------  Colonoscopy: 03/19/2017 Mammogram: 06/21/2019  Review of Systems  Constitutional: Negative.   HENT: Negative.   Eyes: Negative.   Respiratory: Negative.   Cardiovascular: Negative.   Gastrointestinal: Negative.   Endocrine: Positive for cold intolerance.       Fingers and toes- blue  Genitourinary: Negative.   Musculoskeletal: Negative.   Skin: Negative.   Allergic/Immunologic: Negative.   Neurological: Positive for numbness.       Right foot   Hematological: Negative.   Psychiatric/Behavioral: Positive for sleep disturbance.    Social History      She  reports that she has never smoked. She has never used smokeless tobacco. She reports current alcohol use. She reports that she does not use drugs.       Social History   Socioeconomic History  . Marital status: Married    Spouse name: Not on file  . Number of children: 2  . Years of education: Not on file  . Highest education level: Not on file  Occupational History    Employer: [employer]  Tobacco Use  . Smoking status: Never Smoker  . Smokeless tobacco: Never Used  Substance and Sexual Activity  . Alcohol use: Yes    Alcohol/week: 0.0 standard drinks    Comment: on occasion   . Drug use: No  . Sexual activity: Yes    Partners: Male  Other Topics Concern  . Not on file   Social History Narrative  . Not on file   Social Determinants of Health   Financial Resource Strain:   . Difficulty of Paying Living Expenses: Not on file  Food Insecurity:   . Worried About Charity fundraiser in the Last Year: Not on file  . Ran Out of Food in the Last Year: Not on file  Transportation Needs:   . Lack of Transportation (Medical): Not on file  . Lack of Transportation (Non-Medical): Not on file  Physical Activity:   . Days of Exercise per Week: Not on file  . Minutes of Exercise per Session: Not on file  Stress:   . Feeling of Stress : Not on file  Social Connections:   . Frequency of Communication with Friends and Family: Not on file  . Frequency of Social Gatherings with Friends and Family: Not on file  . Attends Religious Services: Not on file  . Active Member of Clubs or Organizations: Not on file  . Attends Archivist Meetings: Not on file  . Marital Status: Not on file    Past Medical History:  Diagnosis Date  . Anemia   . Depression   . Dysrhythmia    racing heart- neg workup  . GERD (gastroesophageal reflux disease)   . Sleep apnea    inconclusive sleep study     Patient Active Problem List   Diagnosis Date Noted  .  B12 deficiency 11/20/2016  . Adaptation reaction 07/12/2015  . Absolute anemia 07/12/2015  . Clinical depression 07/12/2015  . Diabetes ([place]) 07/12/2015  . Elevation of level of transaminase or lactic acid dehydrogenase (LDH) 07/12/2015  . Acid reflux 07/12/2015  . Benign essential HTN 07/12/2015  . Adiposity 07/12/2015  . Apnea, sleep 07/12/2015  . Tension type headache 07/12/2015  . Thoracic outlet syndrome 07/12/2015  . Raynaud's phenomenon 12/15/2014  . Leg cramps, sleep related 12/15/2014  . Bleeding gums 12/15/2014  . Infraspinatus tenosynovitis 07/27/2014  . Impingement syndrome of shoulder 07/27/2014  . Closed fracture of glenoid cavity of scapula 07/27/2014  . Shoulder subluxation 07/27/2014  .  Posterior tibial tendinitis 07/03/2014  . Abnormal weight gain 03/16/2014  . Bilateral polycystic ovarian syndrome 03/16/2014    Past Surgical History:  Procedure Laterality Date  . ABDOMINAL HYSTERECTOMY    . CESAREAN SECTION    . DIAGNOSTIC LAPAROSCOPY    . DILATION AND CURETTAGE OF UTERUS  2011   with hysteroecopy  . TUBAL LIGATION Bilateral     Family History        Family Status  Relation Name Status  . Father  Deceased  . Mother  Alive  . Brother  Alive  . Brother  Alive  . Mat Aunt  (Not Specified)        Her family history includes Breast cancer (age of onset: 87) in her mother; Breast cancer (age of onset: 44) in her maternal aunt; Diabetes in her father; Hypertension in her father; Osteoporosis in her mother.      Allergies  Allergen Reactions  . Vitamin B12 Other (See Comments)    Injection form only-had swollen in both eyes, nausea after getting second injection. Tolerates tablet form Injection form only-had swollen in both eyes, nausea after getting second injection. Tolerates tablet form     Current Outpatient Medications:  .  buPROPion (WELLBUTRIN) 75 MG tablet, Take 1 tablet (75 mg total) by mouth 2 (two) times daily., Disp: 60 tablet, Rfl: 5 .  calcium-vitamin D (CALCIUM 500/D) 500-200 MG-UNIT tablet, Take by mouth., Disp: , Rfl:  .  DULoxetine (CYMBALTA) 60 MG capsule, TAKE ONE CAPSULE BY MOUTH DAILY, Disp: 30 capsule, Rfl: 10 .  losartan-hydrochlorothiazide (HYZAAR) 50-12.5 MG tablet, TAKE ONE TABLET BY MOUTH DAILY, Disp: 90 tablet, Rfl: 3 .  benzonatate (TESSALON) 100 MG capsule, Take 1 capsule (100 mg total) by mouth 2 (two) times daily as needed for cough. (Patient not taking: Reported on 07/27/2019), Disp: 20 capsule, Rfl: 0 .  fluticasone (FLONASE) 50 MCG/ACT nasal spray, Place 2 sprays into both nostrils daily. (Patient not taking: Reported on 07/27/2019), Disp: 16 g, Rfl: 0 .  metFORMIN (GLUCOPHAGE) 500 MG tablet, Take 1 tablet by mouth daily., Disp:  , Rfl:  .  phenol (CHLORASEPTIC) 1.4 % LIQD, Use as directed 1 spray in the mouth or throat as needed for throat irritation / pain. (Patient not taking: Reported on 07/27/2019), Disp: 29 mL, Rfl: 0 .  vitamin B-12 (CYANOCOBALAMIN) 1000 MCG tablet, Take 1 tablet (1,000 mcg total) by mouth daily. (Patient not taking: Reported on 07/22/2018), Disp: 30 tablet, Rfl: 0   Patient Care Team: Jerrol Banana., MD as PCP - General (Family Medicine)    Objective:    Vitals: LMP 01/24/2011   There were no vitals filed for this visit.   Physical Exam Vitals reviewed.  Constitutional:      Appearance: Normal appearance. She is normal weight.  HENT:  Head: Normocephalic and atraumatic.     Right Ear: Tympanic membrane, ear canal and external ear normal.     Left Ear: Tympanic membrane, ear canal and external ear normal.     Nose: Nose normal.     Mouth/Throat:     Mouth: Mucous membranes are moist.     Pharynx: Oropharynx is clear.  Eyes:     Extraocular Movements: Extraocular movements intact.     Conjunctiva/sclera: Conjunctivae normal.     Pupils: Pupils are equal, round, and reactive to light.  Cardiovascular:     Rate and Rhythm: Normal rate and regular rhythm.     Pulses: Normal pulses.     Heart sounds: Normal heart sounds.  Pulmonary:     Effort: Pulmonary effort is normal.     Breath sounds: Normal breath sounds.  Abdominal:     General: Abdomen is flat. Bowel sounds are normal.     Palpations: Abdomen is soft.  Musculoskeletal:        General: Normal range of motion.     Cervical back: Normal range of motion and neck supple.  Skin:    General: Skin is warm and dry.  Neurological:     General: No focal deficit present.     Mental Status: She is alert and oriented to person, place, and time. Mental status is at baseline.  Psychiatric:        Mood and Affect: Mood normal.        Behavior: Behavior normal.        Thought Content: Thought content normal.         Judgment: Judgment normal.      Depression Screen PHQ 2/9 Scores 01/17/2019 07/22/2018 01/20/2017 01/17/2016  PHQ - 2 Score 0 3 2 3   PHQ- 9 Score 5 15 9 10        Assessment & Plan:     Routine Health Maintenance and Physical Exam  Exercise Activities and Dietary recommendations Goals   None     Immunization History  Administered Date(s) Administered  . Influenza,inj,Quad PF,6+ Mos 04/07/2017, 05/11/2018  . Influenza-Unspecified 05/01/2015  . Tdap 07/01/2007    Health Maintenance  Topic Date Due  . PNEUMOCOCCAL POLYSACCHARIDE VACCINE AGE 13-64 HIGH RISK  08/10/1958  . FOOT EXAM  08/10/1966  . OPHTHALMOLOGY EXAM  08/10/1966  . HIV Screening  08/11/1971  . PAP SMEAR-Modifier  01/21/2013  . HEMOGLOBIN A1C  04/03/2017  . TETANUS/TDAP  06/30/2017  . COLONOSCOPY  05/10/2018  . INFLUENZA VACCINE  01/29/2019  . MAMMOGRAM  06/13/2021  . Hepatitis C Screening  Completed     Discussed health benefits of physical activity, and encouraged her to engage in regular exercise appropriate for her age and condition.   1. Annual physical exam EGD/Colonoscopy 09/16/18. - CBC with Differential/Platelet - Comprehensive metabolic panel - TSH - Lipid panel  2. Prediabetes  - Hemoglobin A1c  3. Obstructive sleep apnea syndrome   4. Major depressive disorder with single episode, in partial remission ([place]) RTC this summer.Meds until at least 2022  --------------------------------------------------------------------    Wilhemena Durie, MD  [facility]

## 2019-07-27 ENCOUNTER — Encounter: Payer: Self-pay | Admitting: Family Medicine

## 2019-07-27 ENCOUNTER — Ambulatory Visit (INDEPENDENT_AMBULATORY_CARE_PROVIDER_SITE_OTHER): Payer: BC Managed Care – PPO | Admitting: Family Medicine

## 2019-07-27 ENCOUNTER — Other Ambulatory Visit: Payer: Self-pay

## 2019-07-27 VITALS — BP 102/71 | HR 83 | Temp 92.6°F | Ht 70.0 in | Wt 208.4 lb

## 2019-07-27 DIAGNOSIS — F324 Major depressive disorder, single episode, in partial remission: Secondary | ICD-10-CM | POA: Diagnosis not present

## 2019-07-27 DIAGNOSIS — G4733 Obstructive sleep apnea (adult) (pediatric): Secondary | ICD-10-CM | POA: Diagnosis not present

## 2019-07-27 DIAGNOSIS — R7303 Prediabetes: Secondary | ICD-10-CM | POA: Diagnosis not present

## 2019-07-27 DIAGNOSIS — Z0000 Encounter for general adult medical examination without abnormal findings: Secondary | ICD-10-CM

## 2019-07-28 LAB — COMPREHENSIVE METABOLIC PANEL
ALT: 18 IU/L (ref 0–32)
AST: 20 IU/L (ref 0–40)
Albumin/Globulin Ratio: 2 (ref 1.2–2.2)
Albumin: 4.1 g/dL (ref 3.8–4.8)
Alkaline Phosphatase: 75 IU/L (ref 39–117)
BUN/Creatinine Ratio: 14 (ref 12–28)
BUN: 16 mg/dL (ref 8–27)
Bilirubin Total: 0.5 mg/dL (ref 0.0–1.2)
CO2: 25 mmol/L (ref 20–29)
Calcium: 9.2 mg/dL (ref 8.7–10.3)
Chloride: 104 mmol/L (ref 96–106)
Creatinine, Ser: 1.18 mg/dL — ABNORMAL HIGH (ref 0.57–1.00)
GFR calc Af Amer: 57 mL/min/{1.73_m2} — ABNORMAL LOW (ref >59)
GFR calc non Af Amer: 50 mL/min/{1.73_m2} — ABNORMAL LOW (ref >59)
Globulin, Total: 2.1 g/dL (ref 1.5–4.5)
Glucose: 90 mg/dL (ref 65–99)
Potassium: 3.9 mmol/L (ref 3.5–5.2)
Sodium: 141 mmol/L (ref 134–144)
Total Protein: 6.2 g/dL (ref 6.0–8.5)

## 2019-07-28 LAB — CBC WITH DIFFERENTIAL/PLATELET
Basophils Absolute: 0 10*3/uL (ref 0.0–0.2)
Basos: 1 %
EOS (ABSOLUTE): 0.1 10*3/uL (ref 0.0–0.4)
Eos: 2 %
Hematocrit: 38.1 % (ref 34.0–46.6)
Hemoglobin: 12.8 g/dL (ref 11.1–15.9)
Immature Grans (Abs): 0 10*3/uL (ref 0.0–0.1)
Immature Granulocytes: 0 %
Lymphocytes Absolute: 1.6 10*3/uL (ref 0.7–3.1)
Lymphs: 28 %
MCH: 31.3 pg (ref 26.6–33.0)
MCHC: 33.6 g/dL (ref 31.5–35.7)
MCV: 93 fL (ref 79–97)
Monocytes Absolute: 0.4 10*3/uL (ref 0.1–0.9)
Monocytes: 7 %
Neutrophils Absolute: 3.6 10*3/uL (ref 1.4–7.0)
Neutrophils: 62 %
Platelets: 241 10*3/uL (ref 150–450)
RBC: 4.09 x10E6/uL (ref 3.77–5.28)
RDW: 12.7 % (ref 11.7–15.4)
WBC: 5.8 10*3/uL (ref 3.4–10.8)

## 2019-07-28 LAB — HEMOGLOBIN A1C
Est. average glucose Bld gHb Est-mCnc: 117 mg/dL
Hgb A1c MFr Bld: 5.7 % — ABNORMAL HIGH (ref 4.8–5.6)

## 2019-07-28 LAB — LIPID PANEL
Chol/HDL Ratio: 4.4 ratio (ref 0.0–4.4)
Cholesterol, Total: 161 mg/dL (ref 100–199)
HDL: 37 mg/dL — ABNORMAL LOW (ref >39)
LDL Chol Calc (NIH): 97 mg/dL (ref 0–99)
Triglycerides: 150 mg/dL — ABNORMAL HIGH (ref 0–149)
VLDL Cholesterol Cal: 27 mg/dL (ref 5–40)

## 2019-07-28 LAB — TSH: TSH: 1.62 u[IU]/mL (ref 0.450–4.500)

## 2019-07-29 ENCOUNTER — Encounter: Payer: Self-pay | Admitting: *Deleted

## 2019-08-25 ENCOUNTER — Other Ambulatory Visit: Payer: Self-pay | Admitting: Family Medicine

## 2019-08-25 NOTE — Telephone Encounter (Signed)
Requested medication (s) are due for refill today: yes  Requested medication (s) are on the active medication list: yes  Last refill:  06/27/20  Future visit scheduled: yes  Notes to clinic:  no valid encounter within last 6 months    Requested Prescriptions  Pending Prescriptions Disp Refills   buPROPion (WELLBUTRIN) 75 MG tablet [Pharmacy Med Name: buPROPion HCL 75 MG TABLET] 60 tablet 4    Sig: TAKE ONE TABLET BY MOUTH TWICE A DAY      Psychiatry: Antidepressants - bupropion Failed - 08/25/2019  8:58 AM      Failed - Valid encounter within last 6 months    Recent Outpatient Visits           4 weeks ago Annual physical exam   [facility] Jerrol Banana., MD   7 months ago Major depressive disorder with single episode, in partial remission [facility])   [facility] Jerrol Banana., MD   1 year ago Annual physical exam   [facility] Jerrol Banana., MD   1 year ago Benign essential HTN   [facility] Jerrol Banana., MD   1 year ago Dysfunction of right eustachian tube   [place], [place]       Future Appointments             In 5 months Jerrol Banana., MD [facility]            Passed - Completed PHQ-2 or PHQ-9 in the last 360 days.      Passed - Last BP in normal range    BP Readings from Last 1 Encounters:  07/27/19 102/71

## 2019-10-06 DIAGNOSIS — K2950 Unspecified chronic gastritis without bleeding: Secondary | ICD-10-CM | POA: Diagnosis not present

## 2019-10-06 DIAGNOSIS — K2100 Gastro-esophageal reflux disease with esophagitis, without bleeding: Secondary | ICD-10-CM | POA: Diagnosis not present

## 2019-11-07 ENCOUNTER — Other Ambulatory Visit: Payer: Self-pay | Admitting: Family Medicine

## 2019-11-07 ENCOUNTER — Encounter: Payer: Self-pay | Admitting: Family Medicine

## 2019-11-07 DIAGNOSIS — N6489 Other specified disorders of breast: Secondary | ICD-10-CM

## 2019-11-07 DIAGNOSIS — R928 Other abnormal and inconclusive findings on diagnostic imaging of breast: Secondary | ICD-10-CM

## 2019-11-30 ENCOUNTER — Telehealth: Payer: Self-pay

## 2019-11-30 DIAGNOSIS — Z780 Asymptomatic menopausal state: Secondary | ICD-10-CM

## 2019-11-30 DIAGNOSIS — Z1231 Encounter for screening mammogram for malignant neoplasm of breast: Secondary | ICD-10-CM

## 2019-11-30 NOTE — Telephone Encounter (Signed)
PT needs a follow up mammogram and a bone density test done in June, but cannot schedule either of these things without an order from my primary doctor. Please advise

## 2019-11-30 NOTE — Telephone Encounter (Signed)
Please advise 

## 2019-11-30 NOTE — Telephone Encounter (Signed)
Copied from [place] [id number]. Topic: General - Other >> Nov 30, 2019  9:30 AM Hinda Lenis D wrote: Reason for CRM: PT needs a follow up mammogram and a bone density test done in June, but cannot schedule either of these things without an order from PCP / please advise

## 2019-11-30 NOTE — Telephone Encounter (Signed)
Ok to order 

## 2019-12-01 NOTE — Telephone Encounter (Signed)
Orders have been placed for patient.  °

## 2019-12-01 NOTE — Addendum Note (Signed)
Addended by: Gerald Stabs on: 12/01/2019 04:40 PM   Modules accepted: Orders

## 2020-01-20 NOTE — Progress Notes (Signed)
Established patient visit  I,Vinod Mikesell,acting as a scribe for Wilhemena Durie, MD.,have documented all relevant documentation on the behalf of Wilhemena Durie, MD,as directed by  Wilhemena Durie, MD while in the presence of Wilhemena Durie, MD.   Patient: Kathy Farmer   DOB: 1955-08-08   64 y.o. Female  MRN: [id number] Visit Date: 01/24/2020  Today's healthcare provider: Wilhemena Durie, MD   Chief Complaint  Patient presents with  . Depression  . Follow-up   Subjective    HPI  Overall patient feeling fairly well.  Depression stable.  She has had both Covid vaccines. Prediabetes From 07/27/2019-Hemoglobin A1c-5.7. Labs showed-Stable. Continue to work on good diet and exercise habits.  Major depressive disorder with single episode, in partial remission ([place]) From 07/27/2019-RTC this summer.Meds until at least 2022.      Medications: Outpatient Medications Prior to Visit  Medication Sig  . buPROPion (WELLBUTRIN) 75 MG tablet TAKE ONE TABLET BY MOUTH TWICE A DAY  . DULoxetine (CYMBALTA) 60 MG capsule TAKE ONE CAPSULE BY MOUTH DAILY  . losartan-hydrochlorothiazide (HYZAAR) 50-12.5 MG tablet TAKE ONE TABLET BY MOUTH DAILY  . benzonatate (TESSALON) 100 MG capsule Take 1 capsule (100 mg total) by mouth 2 (two) times daily as needed for cough. (Patient not taking: Reported on 07/27/2019)  . calcium-vitamin D (CALCIUM 500/D) 500-200 MG-UNIT tablet Take by mouth. (Patient not taking: Reported on 01/24/2020)  . fluticasone (FLONASE) 50 MCG/ACT nasal spray Place 2 sprays into both nostrils daily. (Patient not taking: Reported on 07/27/2019)  . metFORMIN (GLUCOPHAGE) 500 MG tablet Take 1 tablet by mouth daily. (Patient not taking: Reported on 01/24/2020)  . phenol (CHLORASEPTIC) 1.4 % LIQD Use as directed 1 spray in the mouth or throat as needed for throat irritation / pain. (Patient not taking: Reported on 07/27/2019)  . vitamin B-12 (CYANOCOBALAMIN) 1000 MCG  tablet Take 1 tablet (1,000 mcg total) by mouth daily. (Patient not taking: Reported on 07/22/2018)   No facility-administered medications prior to visit.    Review of Systems  Constitutional: Negative for appetite change, chills, fatigue and fever.  Respiratory: Negative for chest tightness and shortness of breath.   Cardiovascular: Negative for chest pain and palpitations.  Gastrointestinal: Negative for abdominal pain, nausea and vomiting.  Neurological: Negative for dizziness and weakness.       Objective    BP 109/73 (BP Location: Right Arm, Patient Position: Sitting, Cuff Size: Large)   Pulse 77   Temp (!) 96.9 F (36.1 C) (Other (Comment))   Resp 16   Ht 5\' 10"  (1.778 m)   Wt 197 lb (89.4 kg)   LMP 01/24/2011   SpO2 97%   BMI 28.27 kg/m  Wt Readings from Last 3 Encounters:  01/24/20 197 lb (89.4 kg)  07/27/19 208 lb 6.4 oz (94.5 kg)  07/22/18 224 lb (101.6 kg)      Physical Exam  BP 109/73 (BP Location: Right Arm, Patient Position: Sitting, Cuff Size: Large)   Pulse 77   Temp (!) 96.9 F (36.1 C) (Other (Comment))   Resp 16   Ht 5\' 10"  (1.778 m)   Wt 197 lb (89.4 kg)   LMP 01/24/2011   SpO2 97%   BMI 28.27 kg/m   General Appearance:    Alert, cooperative, no distress, appears stated age  Head:    Normocephalic, without obvious abnormality, atraumatic  Eyes:    PERRL, conjunctiva/corneas clear, EOM's intact, fundi    benign, both eyes  Ears:    Normal TM's and external ear canals, both ears  Nose:   Nares normal, septum midline, mucosa normal, no drainage    or sinus tenderness  Throat:   Lips, mucosa, and tongue normal; teeth and gums normal  Neck:   Supple, symmetrical, trachea midline, no adenopathy;    thyroid:  no enlargement/tenderness/nodules; no carotid   bruit or JVD  Back:     Symmetric, no curvature, ROM normal, no CVA tenderness  Lungs:     Clear to auscultation bilaterally, respirations unlabored  Chest Wall:    No tenderness or deformity     Heart:    Regular rate and rhythm, S1 and S2 normal, no murmur, rub   or gallop  Breast Exam:    No tenderness, masses, or nipple abnormality  Abdomen:     Soft, non-tender, bowel sounds active all four quadrants,    no masses, no organomegaly  Genitalia:    Normal female without lesion, discharge or tenderness  Rectal:    Normal tone, normal prostate, no masses or tenderness;   guaiac negative stool  Extremities:   Extremities normal, atraumatic, no cyanosis or edema  Pulses:   2+ and symmetric all extremities  Skin:   Skin color, texture, turgor normal, no rashes or lesions  Lymph nodes:   Cervical, supraclavicular, and axillary nodes normal  Neurologic:   CNII-XII intact, normal strength, sensation and reflexes    throughout     Results for orders placed or performed in visit on 01/24/20  POCT glycosylated hemoglobin (Hb A1C)  Result Value Ref Range   Hemoglobin A1C 5.7 (A) 4.0 - 5.6 %   Est. average glucose Bld gHb Est-mCnc 117     Assessment & Plan     1. Major depressive disorder with single episode, in partial remission ([place]) Clinically stable  2. Prediabetes Continue to work on diet and exercise.RTC 6 months - POCT glycosylated hemoglobin (Hb A1C)-5.7 today   Return in about 6 months (around 07/26/2020) for CPE.         Richard Cranford Mon, MD  [facility] [phone] (phone) [phone] (fax)  [place]

## 2020-01-23 ENCOUNTER — Ambulatory Visit
Admission: RE | Admit: 2020-01-23 | Discharge: 2020-01-23 | Disposition: A | Discharge status: Home / Self Care | Payer: BC Managed Care – PPO | Source: Ambulatory Visit | Attending: Family Medicine | Admitting: Family Medicine

## 2020-01-23 DIAGNOSIS — R928 Other abnormal and inconclusive findings on diagnostic imaging of breast: Secondary | ICD-10-CM | POA: Diagnosis not present

## 2020-01-23 DIAGNOSIS — N6489 Other specified disorders of breast: Secondary | ICD-10-CM

## 2020-01-24 ENCOUNTER — Other Ambulatory Visit: Payer: Self-pay

## 2020-01-24 ENCOUNTER — Encounter: Payer: Self-pay | Admitting: Family Medicine

## 2020-01-24 ENCOUNTER — Ambulatory Visit: Payer: BC Managed Care – PPO | Admitting: Family Medicine

## 2020-01-24 VITALS — BP 109/73 | HR 77 | Temp 96.9°F | Resp 16 | Ht 70.0 in | Wt 197.0 lb

## 2020-01-24 DIAGNOSIS — R7303 Prediabetes: Secondary | ICD-10-CM

## 2020-01-24 DIAGNOSIS — F324 Major depressive disorder, single episode, in partial remission: Secondary | ICD-10-CM | POA: Diagnosis not present

## 2020-01-24 LAB — POCT GLYCOSYLATED HEMOGLOBIN (HGB A1C)
Est. average glucose Bld gHb Est-mCnc: 117
Hemoglobin A1C: 5.7 % — AB (ref 4.0–5.6)

## 2020-03-09 ENCOUNTER — Other Ambulatory Visit: Payer: Self-pay | Admitting: Family Medicine

## 2020-03-09 DIAGNOSIS — I10 Essential (primary) hypertension: Secondary | ICD-10-CM

## 2020-03-09 DIAGNOSIS — F32A Depression, unspecified: Secondary | ICD-10-CM

## 2020-05-11 ENCOUNTER — Encounter: Payer: Self-pay | Admitting: Family Medicine

## 2020-06-01 DIAGNOSIS — D2262 Melanocytic nevi of left upper limb, including shoulder: Secondary | ICD-10-CM | POA: Diagnosis not present

## 2020-06-01 DIAGNOSIS — L821 Other seborrheic keratosis: Secondary | ICD-10-CM | POA: Diagnosis not present

## 2020-06-01 DIAGNOSIS — D485 Neoplasm of uncertain behavior of skin: Secondary | ICD-10-CM | POA: Diagnosis not present

## 2020-06-01 DIAGNOSIS — D2261 Melanocytic nevi of right upper limb, including shoulder: Secondary | ICD-10-CM | POA: Diagnosis not present

## 2020-06-01 DIAGNOSIS — D225 Melanocytic nevi of trunk: Secondary | ICD-10-CM | POA: Diagnosis not present

## 2020-06-01 DIAGNOSIS — B079 Viral wart, unspecified: Secondary | ICD-10-CM | POA: Diagnosis not present

## 2020-06-07 ENCOUNTER — Other Ambulatory Visit: Payer: Self-pay | Admitting: *Deleted

## 2020-06-07 DIAGNOSIS — N6489 Other specified disorders of breast: Secondary | ICD-10-CM

## 2020-06-07 DIAGNOSIS — R7303 Prediabetes: Secondary | ICD-10-CM

## 2020-06-07 DIAGNOSIS — Z1231 Encounter for screening mammogram for malignant neoplasm of breast: Secondary | ICD-10-CM

## 2020-07-13 ENCOUNTER — Other Ambulatory Visit: Payer: Self-pay

## 2020-07-13 ENCOUNTER — Ambulatory Visit
Admission: RE | Admit: 2020-07-13 | Discharge: 2020-07-13 | Disposition: A | Discharge status: Home / Self Care | Payer: BC Managed Care – PPO | Source: Ambulatory Visit | Attending: Family Medicine | Admitting: Family Medicine

## 2020-07-13 DIAGNOSIS — N6489 Other specified disorders of breast: Secondary | ICD-10-CM | POA: Diagnosis not present

## 2020-07-13 DIAGNOSIS — R922 Inconclusive mammogram: Secondary | ICD-10-CM | POA: Diagnosis not present

## 2020-07-13 DIAGNOSIS — Z1231 Encounter for screening mammogram for malignant neoplasm of breast: Secondary | ICD-10-CM | POA: Diagnosis not present

## 2020-07-27 ENCOUNTER — Telehealth: Payer: Self-pay

## 2020-07-27 NOTE — Telephone Encounter (Signed)
Copied from [place] [id number]. Topic: General - Other >> Jul 27, 2020 10:02 AM Leward Quan A wrote: Reason for CRM: Patient called in to cancel her appointment due to positive Covid but due to failed DT was not able to reschedule. Asking if a nurse can please call her to reschedule for a few weeks out Ph# [phone]

## 2020-07-31 ENCOUNTER — Encounter: Payer: Self-pay | Admitting: Family Medicine

## 2020-07-31 NOTE — Telephone Encounter (Signed)
Scheduled CPE for 11/15/2020 @9 :40 am.

## 2020-08-20 ENCOUNTER — Other Ambulatory Visit: Payer: Self-pay | Admitting: Family Medicine

## 2020-08-20 DIAGNOSIS — I10 Essential (primary) hypertension: Secondary | ICD-10-CM

## 2020-08-24 ENCOUNTER — Other Ambulatory Visit: Payer: Self-pay | Admitting: Family Medicine

## 2020-08-24 DIAGNOSIS — I10 Essential (primary) hypertension: Secondary | ICD-10-CM

## 2020-09-20 ENCOUNTER — Other Ambulatory Visit: Payer: Self-pay | Admitting: Family Medicine

## 2020-09-20 DIAGNOSIS — F32A Depression, unspecified: Secondary | ICD-10-CM

## 2020-11-15 ENCOUNTER — Encounter: Payer: Self-pay | Admitting: Family Medicine

## 2020-11-15 ENCOUNTER — Ambulatory Visit (INDEPENDENT_AMBULATORY_CARE_PROVIDER_SITE_OTHER): Payer: BC Managed Care – PPO | Admitting: Family Medicine

## 2020-11-15 ENCOUNTER — Other Ambulatory Visit: Payer: Self-pay

## 2020-11-15 VITALS — BP 124/81 | HR 75 | Temp 98.1°F | Resp 18 | Ht 70.0 in | Wt 208.0 lb

## 2020-11-15 DIAGNOSIS — Z23 Encounter for immunization: Secondary | ICD-10-CM | POA: Diagnosis not present

## 2020-11-15 DIAGNOSIS — I10 Essential (primary) hypertension: Secondary | ICD-10-CM | POA: Diagnosis not present

## 2020-11-15 DIAGNOSIS — F32A Depression, unspecified: Secondary | ICD-10-CM

## 2020-11-15 DIAGNOSIS — Z0000 Encounter for general adult medical examination without abnormal findings: Secondary | ICD-10-CM

## 2020-11-15 DIAGNOSIS — R7303 Prediabetes: Secondary | ICD-10-CM | POA: Diagnosis not present

## 2020-11-15 DIAGNOSIS — K219 Gastro-esophageal reflux disease without esophagitis: Secondary | ICD-10-CM

## 2020-11-15 MED ORDER — OMEPRAZOLE 20 MG PO CPDR
20.0000 mg | DELAYED_RELEASE_CAPSULE | Freq: Every day | ORAL | 1 refills | Status: DC
Start: 2020-11-15 — End: 2021-09-12

## 2020-11-15 MED ORDER — DULOXETINE HCL 60 MG PO CPEP: 60.0000 mg | ORAL_CAPSULE | Freq: Every day | ORAL | 3 refills | Status: DC

## 2020-11-15 MED ORDER — LOSARTAN POTASSIUM-HCTZ 50-12.5 MG PO TABS: 1.0000 | ORAL_TABLET | Freq: Every day | ORAL | 3 refills | Status: DC

## 2020-11-15 NOTE — Progress Notes (Signed)
I,Roshena L Chambers,acting as a scribe for Wilhemena Durie, MD.,have documented all relevant documentation on the behalf of Wilhemena Durie, MD,as directed by  Wilhemena Durie, MD while in the presence of Wilhemena Durie, MD.   Complete physical exam   Patient: Kathy Farmer   DOB: 05/31/1956   65 y.o. Female  MRN: [id number] Visit Date: 11/15/2020  Today's healthcare provider: Wilhemena Durie, MD   Chief Complaint  Patient presents with  . Annual Exam   Subjective    Lyndia Dicochea is a 65 y.o. female who presents today for a complete physical exam.  She reports consuming a general diet. The patient does not participate in regular exercise at present. She generally feels fairly well. She reports sleeping poorly. She does have additional problems to discuss today. She does have some recent reflux problems.  Night on occasion she has right upper quadrant discomfort but not postprandial pain. Husband is suffering from severe depression and was hospitalized recently. Last CPE: 07/27/2019 Last Colonoscopy: 05/10/2008 internal hemorrhoids Last mammogram: 07/13/2020 BI-RADS CAT 1: Negative Last pap smear: 01/21/2010- normal  (has had hysterectomy) Last Tetanus: 08/19/2006  HPI    Past Medical History:  Diagnosis Date  . Anemia   . Depression   . Dysrhythmia    racing heart- neg workup  . GERD (gastroesophageal reflux disease)   . Sleep apnea    inconclusive sleep study   Past Surgical History:  Procedure Laterality Date  . ABDOMINAL HYSTERECTOMY    . CESAREAN SECTION    . DIAGNOSTIC LAPAROSCOPY    . DILATION AND CURETTAGE OF UTERUS  2011   with hysteroecopy  . TUBAL LIGATION Bilateral    Social History   Socioeconomic History  . Marital status: Married    Spouse name: Not on file  . Number of children: 2  . Years of education: Not on file  . Highest education level: Not on file  Occupational History    Employer:  [employer]  Tobacco Use  . Smoking status: Never Smoker  . Smokeless tobacco: Never Used  Substance and Sexual Activity  . Alcohol use: Yes    Alcohol/week: 0.0 standard drinks    Comment: on occasion   . Drug use: No  . Sexual activity: Yes    Partners: Male  Other Topics Concern  . Not on file  Social History Narrative  . Not on file   Social Determinants of Health   Financial Resource Strain: Not on file  Food Insecurity: Not on file  Transportation Needs: Not on file  Physical Activity: Not on file  Stress: Not on file  Social Connections: Not on file  Intimate Partner Violence: Not on file   Family Status  Relation Name Status  . Father  Deceased  . Mother  Alive  . Brother  Alive  . Brother  Alive  . Mat Aunt  (Not Specified)   Family History  Problem Relation Age of Onset  . Diabetes Father   . Hypertension Father   . Breast cancer Mother 18  . Osteoporosis Mother   . Breast cancer Maternal Aunt 80   Allergies  Allergen Reactions  . Vitamin B12 Other (See Comments)    Injection form only-had swollen in both eyes, nausea after getting second injection. Tolerates tablet form Injection form only-had swollen in both eyes, nausea after getting second injection. Tolerates tablet form    Patient Care Team: Jerrol Banana.,  MD as PCP - General (Family Medicine)   Medications: Outpatient Medications Prior to Visit  Medication Sig  . calcium-vitamin D (OSCAL WITH D) 500-200 MG-UNIT tablet Take by mouth.  . famotidine (PEPCID) 40 MG tablet Take 80 mg by mouth daily.  . [DISCONTINUED] DULoxetine (CYMBALTA) 60 MG capsule TAKE ONE CAPSULE BY MOUTH DAILY  . [DISCONTINUED] losartan-hydrochlorothiazide (HYZAAR) 50-12.5 MG tablet TAKE ONE TABLET BY MOUTH DAILY  . metFORMIN (GLUCOPHAGE) 500 MG tablet Take 1 tablet by mouth daily. (Patient not taking: Reported on 11/15/2020)  . [DISCONTINUED] benzonatate (TESSALON) 100 MG capsule Take 1 capsule (100  mg total) by mouth 2 (two) times daily as needed for cough. (Patient not taking: No sig reported)  . [DISCONTINUED] buPROPion (WELLBUTRIN) 75 MG tablet TAKE ONE TABLET BY MOUTH TWICE A DAY (Patient not taking: Reported on 11/15/2020)  . [DISCONTINUED] fluticasone (FLONASE) 50 MCG/ACT nasal spray Place 2 sprays into both nostrils daily. (Patient not taking: No sig reported)  . [DISCONTINUED] phenol (CHLORASEPTIC) 1.4 % LIQD Use as directed 1 spray in the mouth or throat as needed for throat irritation / pain. (Patient not taking: No sig reported)  . [DISCONTINUED] vitamin B-12 (CYANOCOBALAMIN) 1000 MCG tablet Take 1 tablet (1,000 mcg total) by mouth daily. (Patient not taking: No sig reported)   No facility-administered medications prior to visit.    Review of Systems  Constitutional: Negative for chills, fatigue and fever.  HENT: Positive for hearing loss. Negative for congestion, ear pain, rhinorrhea, sneezing and sore throat.   Eyes: Negative.  Negative for pain and redness.  Respiratory: Positive for cough, choking and chest tightness. Negative for shortness of breath and wheezing.   Cardiovascular: Positive for chest pain. Negative for leg swelling.  Gastrointestinal: Positive for abdominal pain, nausea and vomiting. Negative for blood in stool, constipation and diarrhea.       Acid reflux  Endocrine: Negative for polydipsia and polyphagia.  Genitourinary: Negative.  Negative for dysuria, flank pain, hematuria, pelvic pain, vaginal bleeding and vaginal discharge.  Musculoskeletal: Positive for back pain, joint swelling (right ankle) and neck pain. Negative for arthralgias and gait problem.  Skin: Negative for rash.  Neurological: Positive for numbness (right leg and foot). Negative for dizziness, tremors, seizures, weakness, light-headedness and headaches.  Hematological: Negative for adenopathy.  Psychiatric/Behavioral: Positive for decreased concentration, dysphoric mood and sleep  disturbance. Negative for behavioral problems and confusion. The patient is nervous/anxious. The patient is not hyperactive.        Objective    BP 124/81 (BP Location: Right Arm, Patient Position: Sitting, Cuff Size: Large)   Pulse 75   Temp 98.1 F (36.7 C) (Temporal)   Resp 18   Ht 5\' 10"  (1.778 m)   Wt 208 lb (94.3 kg)   LMP 01/24/2011   BMI 29.84 kg/m  BP Readings from Last 3 Encounters:  11/15/20 124/81  01/24/20 109/73  07/27/19 102/71   Wt Readings from Last 3 Encounters:  11/15/20 208 lb (94.3 kg)  01/24/20 197 lb (89.4 kg)  07/27/19 208 lb 6.4 oz (94.5 kg)      Physical Exam Vitals reviewed.  Constitutional:      Appearance: Normal appearance. She is normal weight.  HENT:     Head: Normocephalic and atraumatic.     Right Ear: Tympanic membrane, ear canal and external ear normal.     Left Ear: Tympanic membrane, ear canal and external ear normal.     Nose: Nose normal.     Mouth/Throat:  Mouth: Mucous membranes are moist.     Pharynx: Oropharynx is clear.  Eyes:     Extraocular Movements: Extraocular movements intact.     Conjunctiva/sclera: Conjunctivae normal.     Pupils: Pupils are equal, round, and reactive to light.  Cardiovascular:     Rate and Rhythm: Normal rate and regular rhythm.     Pulses: Normal pulses.     Heart sounds: Normal heart sounds.  Pulmonary:     Effort: Pulmonary effort is normal.     Breath sounds: Normal breath sounds.  Abdominal:     General: Abdomen is flat. Bowel sounds are normal.     Palpations: Abdomen is soft.     Comments: Negative Murphy sign.  Musculoskeletal:     Cervical back: Normal range of motion and neck supple.  Skin:    General: Skin is warm and dry.  Neurological:     General: No focal deficit present.     Mental Status: She is alert and oriented to person, place, and time. Mental status is at baseline.  Psychiatric:        Mood and Affect: Mood normal.        Behavior: Behavior normal.         Thought Content: Thought content normal.        Judgment: Judgment normal.       Last depression screening scores PHQ 2/9 Scores 11/15/2020 07/27/2019 01/17/2019  PHQ - 2 Score 2 1 0  PHQ- 9 Score 10 3 5    Last fall risk screening Fall Risk  11/15/2020  Falls in the past year? 0  Number falls in past yr: 0  Injury with Fall? 0  Follow up Falls evaluation completed   Last Audit-C alcohol use screening Alcohol Use Disorder Test (AUDIT) 11/15/2020  1. How often do you have a drink containing alcohol? 2  2. How many drinks containing alcohol do you have on a typical day when you are drinking? 0  3. How often do you have six or more drinks on one occasion? 0  AUDIT-C Score 2   A score of 3 or more in women, and 4 or more in men indicates increased risk for alcohol abuse, EXCEPT if all of the points are from question 1   No results found for any visits on 11/15/20.  Assessment & Plan    Routine Health Maintenance and Physical Exam  Exercise Activities and Dietary recommendations Goals   None     Immunization History  Administered Date(s) Administered  . Influenza Split 04/19/2012  . Influenza,inj,Quad PF,6+ Mos 04/23/2013, 04/29/2014, 04/07/2017, 05/11/2018, 05/04/2019  . Influenza-Unspecified 05/01/2015  . Moderna Sars-Covid-2 Vaccination 09/17/2019, 10/15/2019, 06/29/2020  . Tdap 08/19/2006, 11/15/2020    Health Maintenance  Topic Date Due  . FOOT EXAM  Never done  . OPHTHALMOLOGY EXAM  Never done  . HIV Screening  Never done  . PAP SMEAR-Modifier  01/21/2013  . COLONOSCOPY (Pts 45-34yrs Insurance coverage will need to be confirmed)  05/10/2018  . HEMOGLOBIN A1C  07/26/2020  . INFLUENZA VACCINE  01/28/2021  . MAMMOGRAM  07/13/2022  . TETANUS/TDAP  11/16/2030  . COVID-19 Vaccine  Completed  . Hepatitis C Screening  Completed  . HPV VACCINES  Aged Out    Discussed health benefits of physical activity, and encouraged her to engage in regular exercise appropriate for  her age and condition.  1. Annual physical exam Well woman exam per [facility] Skin exam per Dr. Darrick Huntsman - Lipid panel -  TSH  - Comprehensive Metabolic Panel (CMET) - CBC with Differential/Platelet  2. Prediabetes  - Hemoglobin A1C  3. Benign essential HTN Refill Hyzaar which is showing good control - Comprehensive Metabolic Panel (CMET) - CBC with Differential/Platelet - losartan-hydrochlorothiazide (HYZAAR) 50-12.5 MG tablet; Take 1 tablet by mouth daily.  Dispense: 90 tablet; Refill: 3  4. Need for tetanus, diphtheria, and acellular pertussis (Tdap) vaccine in patient of adolescent age or older  - Administer Tetanus-diphtheria-acellular pertussis (Tdap) vaccine  5. Gastroesophageal reflux disease without esophagitis Start omeprazole.  I do not think this is gallbladder disease.  I will see her back in a couple of months. - omeprazole (PRILOSEC) 20 MG capsule; Take 1 capsule (20 mg total) by mouth daily.  Dispense: 90 capsule; Refill: 1  6. Depression On duloxetine.  Clinically stable. - DULoxetine (CYMBALTA) 60 MG capsule; Take 1 capsule (60 mg total) by mouth daily.  Dispense: 90 capsule; Refill: 3  Return in about 6 months (around 05/18/2021).        Saniyya Gau Cranford Mon, MD  [facility] [phone] (phone) [phone] (fax)  [place]

## 2020-11-16 LAB — COMPREHENSIVE METABOLIC PANEL
ALT: 21 IU/L (ref 0–32)
AST: 25 IU/L (ref 0–40)
Albumin/Globulin Ratio: 1.9 (ref 1.2–2.2)
Albumin: 4.6 g/dL (ref 3.8–4.8)
Alkaline Phosphatase: 71 IU/L (ref 44–121)
BUN/Creatinine Ratio: 14 (ref 12–28)
BUN: 17 mg/dL (ref 8–27)
Bilirubin Total: 0.6 mg/dL (ref 0.0–1.2)
CO2: 24 mmol/L (ref 20–29)
Calcium: 9.8 mg/dL (ref 8.7–10.3)
Chloride: 101 mmol/L (ref 96–106)
Creatinine, Ser: 1.24 mg/dL — ABNORMAL HIGH (ref 0.57–1.00)
Globulin, Total: 2.4 g/dL (ref 1.5–4.5)
Glucose: 93 mg/dL (ref 65–99)
Potassium: 4.1 mmol/L (ref 3.5–5.2)
Sodium: 140 mmol/L (ref 134–144)
Total Protein: 7 g/dL (ref 6.0–8.5)
eGFR: 49 mL/min/{1.73_m2} — ABNORMAL LOW (ref >59)

## 2020-11-16 LAB — HEMOGLOBIN A1C
Est. average glucose Bld gHb Est-mCnc: 117 mg/dL
Hgb A1c MFr Bld: 5.7 % — ABNORMAL HIGH (ref 4.8–5.6)

## 2020-11-16 LAB — CBC WITH DIFFERENTIAL/PLATELET
Basophils Absolute: 0 10*3/uL (ref 0.0–0.2)
Basos: 1 %
EOS (ABSOLUTE): 0.2 10*3/uL (ref 0.0–0.4)
Eos: 2 %
Hematocrit: 38.5 % (ref 34.0–46.6)
Hemoglobin: 13.1 g/dL (ref 11.1–15.9)
Immature Grans (Abs): 0 10*3/uL (ref 0.0–0.1)
Immature Granulocytes: 0 %
Lymphocytes Absolute: 1.8 10*3/uL (ref 0.7–3.1)
Lymphs: 29 %
MCH: 30.8 pg (ref 26.6–33.0)
MCHC: 34 g/dL (ref 31.5–35.7)
MCV: 90 fL (ref 79–97)
Monocytes Absolute: 0.5 10*3/uL (ref 0.1–0.9)
Monocytes: 7 %
Neutrophils Absolute: 3.8 10*3/uL (ref 1.4–7.0)
Neutrophils: 61 %
Platelets: 253 10*3/uL (ref 150–450)
RBC: 4.26 x10E6/uL (ref 3.77–5.28)
RDW: 13.1 % (ref 11.7–15.4)
WBC: 6.2 10*3/uL (ref 3.4–10.8)

## 2020-11-16 LAB — LIPID PANEL
Chol/HDL Ratio: 4.6 ratio — ABNORMAL HIGH (ref 0.0–4.4)
Cholesterol, Total: 181 mg/dL (ref 100–199)
HDL: 39 mg/dL — ABNORMAL LOW (ref >39)
LDL Chol Calc (NIH): 118 mg/dL — ABNORMAL HIGH (ref 0–99)
Triglycerides: 134 mg/dL (ref 0–149)
VLDL Cholesterol Cal: 24 mg/dL (ref 5–40)

## 2020-11-16 LAB — TSH: TSH: 1.48 u[IU]/mL (ref 0.450–4.500)

## 2020-12-11 DIAGNOSIS — K2100 Gastro-esophageal reflux disease with esophagitis, without bleeding: Secondary | ICD-10-CM | POA: Diagnosis not present

## 2020-12-11 DIAGNOSIS — K5730 Diverticulosis of large intestine without perforation or abscess without bleeding: Secondary | ICD-10-CM | POA: Diagnosis not present

## 2020-12-11 DIAGNOSIS — K2950 Unspecified chronic gastritis without bleeding: Secondary | ICD-10-CM | POA: Diagnosis not present

## 2021-01-29 ENCOUNTER — Encounter: Payer: Self-pay | Admitting: Family Medicine

## 2021-01-29 DIAGNOSIS — F32A Depression, unspecified: Secondary | ICD-10-CM

## 2021-02-18 MED ORDER — DULOXETINE HCL 30 MG PO CPEP: 30.0000 mg | ORAL_CAPSULE | Freq: Every day | ORAL | 5 refills | Status: DC

## 2021-04-11 DIAGNOSIS — M25571 Pain in right ankle and joints of right foot: Secondary | ICD-10-CM | POA: Diagnosis not present

## 2021-05-01 DIAGNOSIS — M25571 Pain in right ankle and joints of right foot: Secondary | ICD-10-CM | POA: Diagnosis not present

## 2021-05-06 DIAGNOSIS — M25571 Pain in right ankle and joints of right foot: Secondary | ICD-10-CM | POA: Diagnosis not present

## 2021-06-04 DIAGNOSIS — K2100 Gastro-esophageal reflux disease with esophagitis, without bleeding: Secondary | ICD-10-CM | POA: Diagnosis not present

## 2021-06-04 DIAGNOSIS — R152 Fecal urgency: Secondary | ICD-10-CM | POA: Diagnosis not present

## 2021-06-04 DIAGNOSIS — R197 Diarrhea, unspecified: Secondary | ICD-10-CM | POA: Diagnosis not present

## 2021-06-04 DIAGNOSIS — K625 Hemorrhage of anus and rectum: Secondary | ICD-10-CM | POA: Diagnosis not present

## 2021-06-04 DIAGNOSIS — R1031 Right lower quadrant pain: Secondary | ICD-10-CM | POA: Diagnosis not present

## 2021-06-05 DIAGNOSIS — D225 Melanocytic nevi of trunk: Secondary | ICD-10-CM | POA: Diagnosis not present

## 2021-06-05 DIAGNOSIS — L720 Epidermal cyst: Secondary | ICD-10-CM | POA: Diagnosis not present

## 2021-06-05 DIAGNOSIS — D2272 Melanocytic nevi of left lower limb, including hip: Secondary | ICD-10-CM | POA: Diagnosis not present

## 2021-06-05 DIAGNOSIS — L298 Other pruritus: Secondary | ICD-10-CM | POA: Diagnosis not present

## 2021-06-05 DIAGNOSIS — R58 Hemorrhage, not elsewhere classified: Secondary | ICD-10-CM | POA: Diagnosis not present

## 2021-06-05 DIAGNOSIS — D485 Neoplasm of uncertain behavior of skin: Secondary | ICD-10-CM | POA: Diagnosis not present

## 2021-06-05 DIAGNOSIS — L820 Inflamed seborrheic keratosis: Secondary | ICD-10-CM | POA: Diagnosis not present

## 2021-06-06 DIAGNOSIS — R1031 Right lower quadrant pain: Secondary | ICD-10-CM | POA: Diagnosis not present

## 2021-06-06 DIAGNOSIS — R1032 Left lower quadrant pain: Secondary | ICD-10-CM | POA: Diagnosis not present

## 2021-06-06 DIAGNOSIS — K625 Hemorrhage of anus and rectum: Secondary | ICD-10-CM | POA: Diagnosis not present

## 2021-06-06 DIAGNOSIS — R197 Diarrhea, unspecified: Secondary | ICD-10-CM | POA: Diagnosis not present

## 2021-06-30 DIAGNOSIS — C50919 Malignant neoplasm of unspecified site of unspecified female breast: Secondary | ICD-10-CM

## 2021-06-30 HISTORY — DX: Malignant neoplasm of unspecified site of unspecified female breast: C50.919

## 2021-08-12 NOTE — Progress Notes (Signed)
I,Sha'taria Tyson,acting as a Education administrator for [employer], PA-C.,have documented all relevant documentation on the behalf of [name], PA-C,as directed by  [name], PA-C while in the presence of [name], PA-C.  Established patient visit   Patient: Kathy Farmer   DOB: 1956/04/26   66 y.o. Female  MRN: [id number] Visit Date: 08/13/2021  Today's healthcare provider: [name], PA-C   Cc. Weight gain, joint pain  Subjective    HPI  Nadine is a 66 y/o female with a PMH of PCOS previously on metformin, HTN, and GERD who presents today with multiple concerns. She reports an increase in right ankle pain and leg cramps, for the past 6 months but increasing in frequency over the last month or so. 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  She also reports right hip/low back/ buttock pain, and right ring finger pain. Describes as aching.   She reports significant diarrhea as a side effect of omeprazole and pepcid. She is not taking any acid medication currently and manages with diet and timing of eating.  She reports a 30 pound weight gain over the last 6 months, denies any changes in diet or exercise. Reports walking 30 minutes daily, and some daily more. Reports eating around 1100-1200 calories a day, usually only eats breakfast and lunch, maybe an early dinner. She has tried a program though '[place] weight loss' and did not lose any weight.   Medications: Outpatient Medications Prior to Visit  Medication Sig   famotidine (PEPCID) 40 MG tablet Take 80 mg by mouth daily.   losartan-hydrochlorothiazide (HYZAAR) 50-12.5 MG tablet Take 1 tablet by mouth daily.   omeprazole (PRILOSEC) 20 MG capsule Take 1 capsule (20 mg total) by mouth daily.   [DISCONTINUED] calcium-vitamin D (OSCAL WITH D) 500-200 MG-UNIT tablet Take by mouth. (Patient not taking: Reported on 08/13/2021)   [DISCONTINUED] DULoxetine (CYMBALTA) 30 MG  capsule Take 1 capsule (30 mg total) by mouth daily. (Patient not taking: Reported on 08/13/2021)   [DISCONTINUED] metFORMIN (GLUCOPHAGE) 500 MG tablet Take 1 tablet by mouth daily. (Patient not taking: Reported on 11/15/2020)   No facility-administered medications prior to visit.    Review of Systems  Constitutional:  Positive for unexpected weight change. Negative for fatigue and fever.  Respiratory:  Negative for cough and shortness of breath.   Cardiovascular:  Negative for chest pain and leg swelling.  Gastrointestinal:  Negative for abdominal pain.  Musculoskeletal:  Positive for arthralgias, back pain and joint swelling.  Neurological:  Negative for dizziness and headaches.      Objective    BP 112/77 (BP Location: Right Arm, Patient Position: Sitting, Cuff Size: Large)    Pulse 70    Ht 5\' 10"  (1.778 m)    Wt 231 lb (104.8 kg)    LMP 01/24/2011    SpO2 98%    BMI 33.15 kg/m  Blood pressure 112/77, pulse 70, height 5\' 10"  (1.778 m), weight 231 lb (104.8 kg), last menstrual period 01/24/2011, SpO2 98 %.   Physical Exam Constitutional:      General: She is awake.     Appearance: She is well-developed.  HENT:     Head: Normocephalic.  Eyes:     Conjunctiva/sclera: Conjunctivae normal.  Cardiovascular:     Rate and Rhythm: Normal rate and regular rhythm.     Heart sounds: Normal heart sounds.  Pulmonary:     Effort: Pulmonary effort is normal.  Breath sounds: Normal breath sounds.  Musculoskeletal:     Right lower leg: 1+ Edema present.  Skin:    General: Skin is warm.  Neurological:     Mental Status: She is alert and oriented to person, place, and time.  Psychiatric:        Attention and Perception: Attention normal.        Mood and Affect: Mood normal.        Speech: Speech normal.        Behavior: Behavior is cooperative.    No results found for any visits on 08/13/21.  Assessment & Plan     Problem List Items Addressed This Visit       Digestive   Acid  reflux    For now, we can control with TUMS and famotidine if needed. Pt will avoid diet triggers. Will reeval if not improved with weight loss        Endocrine   Bilateral polycystic ovarian syndrome - Primary    Previously saw endocrinologist and was treated with Metformin. She has been off for a few years. Discussed checking a1c and potentially restarting metformin.         Other   Weight gain    Difficult to discern etiology, normal TSH in 12/22. Will recheck A1c may be secondary to PCOS Discussed her diet at length, I feel she is restricting herself too much, discussed healthy choices and best times to eat. Consider ref to dietician in future. Pt curious to her Vit D level and if this could impact her weight gain or chronic pain      Relevant Orders   Comprehensive Metabolic Panel (CMET)   Vitamin D (25 hydroxy)   Paresthesia of right foot    Will recheck B12 Pain likely secondary to surgical procedure, weakness/deconditioning in that ankle, and likely arthritis.  Subsequent hip/ low back pain likely secondary to compensation for ankle. May improve with weight loss. Consider PT referral in future      Relevant Orders   Vitamin B12   Other Visit Diagnoses     Prediabetes       Relevant Orders   HgB A1c   Encounter for screening mammogram for breast cancer       Relevant Orders   MM 3D SCREEN BREAST BILATERAL         Return in about 3 months (around 11/10/2021) for insulin insensitivity, chronic pain.      I, [name], PA-C have reviewed all documentation for this visit. The documentation on  08/13/2021 for the exam, diagnosis, procedures, and orders are all accurate and complete.    [name], PA-C  [facility] [phone] (phone) [phone] (fax)  [place]

## 2021-08-13 ENCOUNTER — Other Ambulatory Visit: Payer: Self-pay

## 2021-08-13 ENCOUNTER — Ambulatory Visit: Payer: Medicare Other | Admitting: Physician Assistant

## 2021-08-13 ENCOUNTER — Encounter: Payer: Self-pay | Admitting: Physician Assistant

## 2021-08-13 VITALS — BP 112/77 | HR 70 | Ht 70.0 in | Wt 231.0 lb

## 2021-08-13 DIAGNOSIS — E282 Polycystic ovarian syndrome: Secondary | ICD-10-CM

## 2021-08-13 DIAGNOSIS — R7303 Prediabetes: Secondary | ICD-10-CM | POA: Diagnosis not present

## 2021-08-13 DIAGNOSIS — Z1231 Encounter for screening mammogram for malignant neoplasm of breast: Secondary | ICD-10-CM

## 2021-08-13 DIAGNOSIS — R635 Abnormal weight gain: Secondary | ICD-10-CM | POA: Diagnosis not present

## 2021-08-13 DIAGNOSIS — K219 Gastro-esophageal reflux disease without esophagitis: Secondary | ICD-10-CM

## 2021-08-13 DIAGNOSIS — R202 Paresthesia of skin: Secondary | ICD-10-CM | POA: Insufficient documentation

## 2021-08-13 NOTE — Assessment & Plan Note (Signed)
For now, we can control with TUMS and famotidine if needed. Pt will avoid diet triggers. Will reeval if not improved with weight loss

## 2021-08-13 NOTE — Assessment & Plan Note (Signed)
Will recheck B12 Pain likely secondary to surgical procedure, weakness/deconditioning in that ankle, and likely arthritis.  Subsequent hip/ low back pain likely secondary to compensation for ankle. May improve with weight loss. Consider PT referral in future

## 2021-08-13 NOTE — Assessment & Plan Note (Signed)
Previously saw endocrinologist and was treated with Metformin. She has been off for a few years. Discussed checking a1c and potentially restarting metformin.

## 2021-08-13 NOTE — Patient Instructions (Signed)
Calcium 1200 mg daily Vitamin D 2,000- 5,000 IU daily

## 2021-08-13 NOTE — Assessment & Plan Note (Addendum)
Difficult to discern etiology, normal TSH in 12/22. Will recheck A1c may be secondary to PCOS Discussed her diet at length, I feel she is restricting herself too much, discussed healthy choices and best times to eat. Consider ref to dietician in future. Pt curious to her Vit D level and if this could impact her weight gain or chronic pain

## 2021-08-14 ENCOUNTER — Other Ambulatory Visit: Payer: Self-pay | Admitting: Physician Assistant

## 2021-08-14 DIAGNOSIS — E282 Polycystic ovarian syndrome: Secondary | ICD-10-CM

## 2021-08-14 DIAGNOSIS — R7303 Prediabetes: Secondary | ICD-10-CM

## 2021-08-14 LAB — COMPREHENSIVE METABOLIC PANEL
ALT: 25 IU/L (ref 0–32)
AST: 24 IU/L (ref 0–40)
Albumin/Globulin Ratio: 1.8 (ref 1.2–2.2)
Albumin: 4 g/dL (ref 3.8–4.8)
Alkaline Phosphatase: 68 IU/L (ref 44–121)
BUN/Creatinine Ratio: 12 (ref 12–28)
BUN: 16 mg/dL (ref 8–27)
Bilirubin Total: 0.3 mg/dL (ref 0.0–1.2)
CO2: 25 mmol/L (ref 20–29)
Calcium: 9.1 mg/dL (ref 8.7–10.3)
Chloride: 110 mmol/L — ABNORMAL HIGH (ref 96–106)
Creatinine, Ser: 1.32 mg/dL — ABNORMAL HIGH (ref 0.57–1.00)
Globulin, Total: 2.2 g/dL (ref 1.5–4.5)
Glucose: 96 mg/dL (ref 70–99)
Potassium: 4.6 mmol/L (ref 3.5–5.2)
Sodium: 145 mmol/L — ABNORMAL HIGH (ref 134–144)
Total Protein: 6.2 g/dL (ref 6.0–8.5)
eGFR: 45 mL/min/{1.73_m2} — ABNORMAL LOW (ref >59)

## 2021-08-14 LAB — VITAMIN B12: Vitamin B-12: 195 pg/mL — ABNORMAL LOW (ref 232–1245)

## 2021-08-14 LAB — HEMOGLOBIN A1C
Est. average glucose Bld gHb Est-mCnc: 126 mg/dL
Hgb A1c MFr Bld: 6 % — ABNORMAL HIGH (ref 4.8–5.6)

## 2021-08-14 LAB — VITAMIN D 25 HYDROXY (VIT D DEFICIENCY, FRACTURES): Vit D, 25-Hydroxy: 21.7 ng/mL — ABNORMAL LOW (ref 30.0–100.0)

## 2021-08-14 MED ORDER — METFORMIN HCL 500 MG PO TABS: 500.0000 mg | ORAL_TABLET | Freq: Two times a day (BID) | ORAL | 1 refills | Status: DC

## 2021-09-04 ENCOUNTER — Encounter: Payer: Self-pay | Admitting: Physician Assistant

## 2021-09-12 ENCOUNTER — Other Ambulatory Visit: Payer: Self-pay

## 2021-09-12 ENCOUNTER — Encounter: Payer: Self-pay | Admitting: Physician Assistant

## 2021-09-12 ENCOUNTER — Ambulatory Visit (INDEPENDENT_AMBULATORY_CARE_PROVIDER_SITE_OTHER): Payer: Medicare Other | Admitting: Physician Assistant

## 2021-09-12 VITALS — BP 117/73 | HR 85 | Resp 16 | Wt 233.3 lb

## 2021-09-12 DIAGNOSIS — R1012 Left upper quadrant pain: Secondary | ICD-10-CM | POA: Insufficient documentation

## 2021-09-12 DIAGNOSIS — K219 Gastro-esophageal reflux disease without esophagitis: Secondary | ICD-10-CM | POA: Diagnosis not present

## 2021-09-12 MED ORDER — SUCRALFATE 1 G PO TABS: 1.0000 g | ORAL_TABLET | Freq: Three times a day (TID) | ORAL | 0 refills | Status: DC

## 2021-09-12 MED ORDER — OMEPRAZOLE 40 MG PO CPDR: 40.0000 mg | DELAYED_RELEASE_CAPSULE | Freq: Every day | ORAL | 1 refills | Status: DC

## 2021-09-12 NOTE — Assessment & Plan Note (Signed)
EKG done today to exclude cardiac etiology. Normal sinus without ST elevation, Q wave or T wave inversion.  ?See GERD note ?

## 2021-09-12 NOTE — Assessment & Plan Note (Signed)
Continue omeprazole 40 mg in AM.  ?Continue famotidine up to 40 mg a day, supplementing with TUMS for breakthrough symptoms ?Adding carafate during meals and before bed. ?If no improvement at all in 1 week, d/c metformin. ?If no improvement w/ metformin d/c, call office will ref to GI ? ? ?

## 2021-09-12 NOTE — Progress Notes (Signed)
? ?I,Sha'taria Tyson,acting as a Education administrator for [employer], PA-C.,have documented all relevant documentation on the behalf of [name], PA-C,as directed by  [name], PA-C while in the presence of [name], PA-C. ? ?Established Patient Office Visit ? ?Subjective:  ?Patient ID: Kathy Farmer, female    DOB: Apr 03, 1956  Age: 66 y.o. MRN: [id number] ? ?CC:  ?Chief Complaint  ?Patient presents with  ? Gastroesophageal Reflux  ? ? ?HPI ?Kathy Farmer presents for severe indigestion, acid reflux, pain under left rib cage. ?She has dealt with chronic gerd and takes omeprazole 20 mg on a regular basis. Around the same time she started the Metformin, 2-3 weeks ago, she began experiencing severe indigestion, reflux after meals, bloating, burning chest pain, and stabbing 'twisting,squeezing' LUQ pain. She has increased her omeprazole to 40 mg in the AM, takes 40 mg total of famotidine during the day, and supplements with mylanta. None of this has helped with the pain. Reports prior endoscopy 2020 with esophagitis and gastritis. These symptoms are more severe than then.  ?She also reports BM are more diarrhea, loose/liquid and paler in color.  ? ?Denies pain is associated with changes in vision, headaches, dizziness, radiating pain, diaphoresis. Denies vomiting.  ? ? ? ?Past Medical History:  ?Diagnosis Date  ? Anemia   ? Depression   ? Dysrhythmia   ? racing heart- neg workup  ? GERD (gastroesophageal reflux disease)   ? Sleep apnea   ? inconclusive sleep study  ? ? ?Past Surgical History:  ?Procedure Laterality Date  ? ABDOMINAL HYSTERECTOMY    ? CESAREAN SECTION    ? DIAGNOSTIC LAPAROSCOPY    ? DILATION AND CURETTAGE OF UTERUS  2011  ? with hysteroecopy  ? TUBAL LIGATION Bilateral   ? ? ?Family History  ?Problem Relation Age of Onset  ? Diabetes Father   ? Hypertension Father   ? Breast cancer Mother 70  ? Osteoporosis Mother   ? Breast cancer Maternal Aunt 80  ? ? ?Social History   ? ?Socioeconomic History  ? Marital status: Married  ?  Spouse name: Not on file  ? Number of children: 2  ? Years of education: Not on file  ? Highest education level: Not on file  ?Occupational History  ?  Employer: [employer]  ?Tobacco Use  ? Smoking status: Never  ? Smokeless tobacco: Never  ?Substance and Sexual Activity  ? Alcohol use: Yes  ?  Alcohol/week: 0.0 standard drinks  ?  Comment: on occasion   ? Drug use: No  ? Sexual activity: Yes  ?  Partners: Male  ?Other Topics Concern  ? Not on file  ?Social History Narrative  ? Not on file  ? ?Social Determinants of Health  ? ?Financial Resource Strain: Not on file  ?Food Insecurity: Not on file  ?Transportation Needs: Not on file  ?Physical Activity: Not on file  ?Stress: Not on file  ?Social Connections: Not on file  ?Intimate Partner Violence: Not on file  ? ? ?Outpatient Medications Prior to Visit  ?Medication Sig Dispense Refill  ? famotidine (PEPCID) 40 MG tablet Take 80 mg by mouth daily.    ? losartan-hydrochlorothiazide (HYZAAR) 50-12.5 MG tablet Take 1 tablet by mouth daily. 90 tablet 3  ? metFORMIN (GLUCOPHAGE) 500 MG tablet Take 1 tablet (500 mg total) by mouth 2 (two) times daily with a meal. 180 tablet 1  ? omeprazole (PRILOSEC) 20 MG capsule Take 1 capsule (20 mg total)  by mouth daily. 90 capsule 1  ? ?No facility-administered medications prior to visit.  ? ? ?Allergies  ?Allergen Reactions  ? Vitamin B12 Other (See Comments)  ?  Injection form only-had swollen in both eyes, nausea after getting second injection. Tolerates tablet form ?Injection form only-had swollen in both eyes, nausea after getting second injection. Tolerates tablet form  ? ? ?ROS ?Review of Systems  ?Constitutional:  Negative for fatigue and fever.  ?Respiratory:  Positive for cough. Negative for shortness of breath.   ?Cardiovascular:  Positive for chest pain. Negative for leg swelling.  ?Gastrointestinal:  Positive for diarrhea. Negative for abdominal pain.   ?Neurological:  Negative for dizziness and headaches.  ? ?  ?Objective:  ?  ?Physical Exam ?Constitutional:   ?   Appearance: Normal appearance. She is not ill-appearing.  ?HENT:  ?   Head: Normocephalic.  ?Eyes:  ?   Conjunctiva/sclera: Conjunctivae normal.  ?Cardiovascular:  ?   Rate and Rhythm: Normal rate and regular rhythm.  ?   Pulses: Normal pulses.  ?   Heart sounds: Normal heart sounds.  ?Pulmonary:  ?   Effort: Pulmonary effort is normal.  ?   Breath sounds: Normal breath sounds.  ?Abdominal:  ?   General: There is no distension.  ?   Palpations: Abdomen is soft. There is no mass.  ?   Tenderness: There is abdominal tenderness in the left upper quadrant. There is guarding. There is no rebound.  ?Neurological:  ?   Mental Status: She is oriented to person, place, and time.  ?Psychiatric:     ?   Mood and Affect: Mood normal.     ?   Behavior: Behavior normal.  ? ? ?BP 117/73   Pulse 85   Resp 16   Wt 233 lb 4.8 oz (105.8 kg)   LMP 01/24/2011   SpO2 100%   BMI 33.48 kg/m?  ?Wt Readings from Last 3 Encounters:  ?09/12/21 233 lb 4.8 oz (105.8 kg)  ?08/13/21 231 lb (104.8 kg)  ?11/15/20 208 lb (94.3 kg)  ? ? ? ?Health Maintenance Due  ?Topic Date Due  ? FOOT EXAM  Never done  ? OPHTHALMOLOGY EXAM  Never done  ? HIV Screening  Never done  ? PAP SMEAR-Modifier  01/21/2013  ? Pneumonia Vaccine 29+ Years old (1 - PCV) Never done  ? DEXA SCAN  Never done  ? ? ?There are no preventive care reminders to display for this patient. ? ?Lab Results  ?Component Value Date  ? TSH 1.480 11/15/2020  ? ?Lab Results  ?Component Value Date  ? WBC 6.2 11/15/2020  ? HGB 13.1 11/15/2020  ? HCT 38.5 11/15/2020  ? MCV 90 11/15/2020  ? PLT 253 11/15/2020  ? ?Lab Results  ?Component Value Date  ? NA 145 (H) 08/13/2021  ? K 4.6 08/13/2021  ? CO2 25 08/13/2021  ? GLUCOSE 96 08/13/2021  ? BUN 16 08/13/2021  ? CREATININE 1.32 (H) 08/13/2021  ? BILITOT 0.3 08/13/2021  ? ALKPHOS 68 08/13/2021  ? AST 24 08/13/2021  ? ALT 25 08/13/2021   ? PROT 6.2 08/13/2021  ? ALBUMIN 4.0 08/13/2021  ? CALCIUM 9.1 08/13/2021  ? EGFR 45 (L) 08/13/2021  ? ?Lab Results  ?Component Value Date  ? CHOL 181 11/15/2020  ? ?Lab Results  ?Component Value Date  ? HDL 39 (L) 11/15/2020  ? ?Lab Results  ?Component Value Date  ? LDLCALC 118 (H) 11/15/2020  ? ?Lab Results  ?Component Value  Date  ? TRIG 134 11/15/2020  ? ?Lab Results  ?Component Value Date  ? CHOLHDL 4.6 (H) 11/15/2020  ? ?Lab Results  ?Component Value Date  ? HGBA1C 6.0 (H) 08/13/2021  ? ? ?  ?Assessment & Plan:  ? ?Problem List Items Addressed This Visit   ? ?  ? Digestive  ? GERD (gastroesophageal reflux disease) - Primary  ?  Continue omeprazole 40 mg in AM.  ?Continue famotidine up to 40 mg a day, supplementing with TUMS for breakthrough symptoms ?Adding carafate during meals and before bed. ?If no improvement at all in 1 week, d/c metformin. ?If no improvement w/ metformin d/c, call office will ref to GI ? ? ?  ?  ? Relevant Medications  ? sucralfate (CARAFATE) 1 g tablet  ? omeprazole (PRILOSEC) 40 MG capsule  ?  ? Other  ? LUQ abdominal pain  ?  EKG done today to exclude cardiac etiology. Normal sinus without ST elevation, Q wave or T wave inversion.  ?See GERD note ?  ?  ? Relevant Medications  ? omeprazole (PRILOSEC) 40 MG capsule  ? Other Relevant Orders  ? EKG 12-Lead (Completed)  ?  ? ?Follow-up: Return if symptoms worsen or fail to improve.  ? ?I, [name], PA-C have reviewed all documentation for this visit. The documentation on  09/12/2021  for the exam, diagnosis, procedures, and orders are all accurate and complete. ? ?[name], PA-C ?[facility] ?[id number] ?[place], [place], [address] ?Office: [phone] ?Fax: [phone]  ?

## 2021-09-17 ENCOUNTER — Ambulatory Visit: Payer: Medicare Other | Admitting: Physician Assistant

## 2021-09-24 ENCOUNTER — Other Ambulatory Visit: Payer: Self-pay | Admitting: Physician Assistant

## 2021-09-24 DIAGNOSIS — K219 Gastro-esophageal reflux disease without esophagitis: Secondary | ICD-10-CM

## 2021-09-24 DIAGNOSIS — R197 Diarrhea, unspecified: Secondary | ICD-10-CM

## 2021-09-24 DIAGNOSIS — R7989 Other specified abnormal findings of blood chemistry: Secondary | ICD-10-CM

## 2021-09-24 NOTE — Progress Notes (Signed)
Symptoms of diarrhea and acid reflux not improved ?Pt requesting ref to GI ?Unfortunately likely unable to run hpylori as current use of omeprazole/other acid agents and unable to stop for 2 weeks ?Due to AKI a month ago, would like to repeat CMP ?

## 2021-09-26 LAB — COMPREHENSIVE METABOLIC PANEL
ALT: 20 IU/L (ref 0–32)
AST: 23 IU/L (ref 0–40)
Albumin/Globulin Ratio: 1.8 (ref 1.2–2.2)
Albumin: 4.2 g/dL (ref 3.8–4.8)
Alkaline Phosphatase: 73 IU/L (ref 44–121)
BUN/Creatinine Ratio: 11 — ABNORMAL LOW (ref 12–28)
BUN: 15 mg/dL (ref 8–27)
Bilirubin Total: 0.5 mg/dL (ref 0.0–1.2)
CO2: 24 mmol/L (ref 20–29)
Calcium: 9.5 mg/dL (ref 8.7–10.3)
Chloride: 106 mmol/L (ref 96–106)
Creatinine, Ser: 1.42 mg/dL — ABNORMAL HIGH (ref 0.57–1.00)
Globulin, Total: 2.3 g/dL (ref 1.5–4.5)
Glucose: 100 mg/dL — ABNORMAL HIGH (ref 70–99)
Potassium: 4 mmol/L (ref 3.5–5.2)
Sodium: 144 mmol/L (ref 134–144)
Total Protein: 6.5 g/dL (ref 6.0–8.5)
eGFR: 41 mL/min/{1.73_m2} — ABNORMAL LOW (ref >59)

## 2021-09-27 ENCOUNTER — Telehealth: Payer: Self-pay

## 2021-09-27 ENCOUNTER — Other Ambulatory Visit: Payer: Self-pay | Admitting: Physician Assistant

## 2021-09-27 ENCOUNTER — Encounter: Payer: Self-pay | Admitting: Physician Assistant

## 2021-09-27 DIAGNOSIS — K219 Gastro-esophageal reflux disease without esophagitis: Secondary | ICD-10-CM

## 2021-09-27 MED ORDER — PANTOPRAZOLE SODIUM 40 MG PO TBEC: 40.0000 mg | DELAYED_RELEASE_TABLET | Freq: Every day | ORAL | 1 refills | Status: DC

## 2021-09-27 NOTE — Telephone Encounter (Signed)
Copied from [place] [id number]. Topic: General - Call Back - No Documentation ?>> Sep 27, 2021 10:19 AM Yvette Rack wrote: ?Reason for CRM: Pt stated she was returning call to [place]. Pt requests call back. Cb# [phone] ?

## 2021-09-30 ENCOUNTER — Telehealth: Payer: Self-pay

## 2021-09-30 ENCOUNTER — Other Ambulatory Visit: Payer: Self-pay | Admitting: Physician Assistant

## 2021-09-30 DIAGNOSIS — K219 Gastro-esophageal reflux disease without esophagitis: Secondary | ICD-10-CM

## 2021-09-30 MED ORDER — SUCRALFATE 1 G PO TABS: 1.0000 g | ORAL_TABLET | Freq: Three times a day (TID) | ORAL | 0 refills | Status: DC

## 2021-09-30 NOTE — Telephone Encounter (Signed)
CALLED PATIENT NO ANSWER LEFT VOICEMAIL FOR A CALL BACK °Letter sent °

## 2021-10-16 ENCOUNTER — Ambulatory Visit
Admission: RE | Admit: 2021-10-16 | Discharge: 2021-10-16 | Disposition: A | Discharge status: Home / Self Care | Payer: Medicare Other | Source: Ambulatory Visit | Attending: Physician Assistant | Admitting: Physician Assistant

## 2021-10-16 ENCOUNTER — Other Ambulatory Visit: Payer: Self-pay | Admitting: Physician Assistant

## 2021-10-16 DIAGNOSIS — Z1231 Encounter for screening mammogram for malignant neoplasm of breast: Secondary | ICD-10-CM | POA: Diagnosis present

## 2021-10-16 DIAGNOSIS — R928 Other abnormal and inconclusive findings on diagnostic imaging of breast: Secondary | ICD-10-CM

## 2021-11-05 ENCOUNTER — Ambulatory Visit
Admission: RE | Admit: 2021-11-05 | Discharge: 2021-11-05 | Disposition: A | Discharge status: Home / Self Care | Payer: Medicare Other | Source: Ambulatory Visit | Attending: Physician Assistant | Admitting: Physician Assistant

## 2021-11-05 DIAGNOSIS — R928 Other abnormal and inconclusive findings on diagnostic imaging of breast: Secondary | ICD-10-CM | POA: Insufficient documentation

## 2021-11-06 ENCOUNTER — Other Ambulatory Visit: Payer: Self-pay | Admitting: Physician Assistant

## 2021-11-06 DIAGNOSIS — R928 Other abnormal and inconclusive findings on diagnostic imaging of breast: Secondary | ICD-10-CM

## 2021-11-06 NOTE — Progress Notes (Signed)
?  ? ? ?I,Sha'taria Tyson,acting as a Education administrator for [employer], PA-C.,have documented all relevant documentation on the behalf of [name], PA-C,as directed by  [name], PA-C while in the presence of [name], PA-C. ? ?Established patient visit ? ? ?Patient: Kathy Farmer   DOB: 1956/05/14   66 y.o. Female  MRN: [id number] ?Visit Date: 11/07/2021 ? ?Today's healthcare provider: [name], PA-C  ? ?Cc. Follow up ? ?Subjective  ?  ?HPI  ? ?Follow up on GI symptoms, weight gain, insulin insensitivity. No change in symptoms. Has stopped metformin. ?Questions about mammogram and planned biopsy. ? ?Medications: ?Outpatient Medications Prior to Visit  ?Medication Sig  ? famotidine (PEPCID) 40 MG tablet Take 80 mg by mouth daily.  ? losartan-hydrochlorothiazide (HYZAAR) 50-12.5 MG tablet Take 1 tablet by mouth daily.  ? Na Sulfate-K Sulfate-Mg Sulf 17.5-3.13-1.6 GM/177ML SOLN Take by mouth.  ? omeprazole (PRILOSEC) 40 MG capsule   ? pantoprazole (PROTONIX) 40 MG tablet Take 1 tablet (40 mg total) by mouth daily.  ? metFORMIN (GLUCOPHAGE) 500 MG tablet Take 1 tablet (500 mg total) by mouth 2 (two) times daily with a meal. (Patient not taking: Reported on 11/07/2021)  ? sucralfate (CARAFATE) 1 g tablet Take 1 tablet (1 g total) by mouth 4 (four) times daily -  with meals and at bedtime for 15 days.  ? ?No facility-administered medications prior to visit.  ? ? ?Review of Systems  ?Constitutional:  Negative for fatigue and fever.  ?Respiratory:  Negative for cough and shortness of breath.   ?Cardiovascular:  Negative for chest pain and leg swelling.  ?Gastrointestinal:  Positive for diarrhea and nausea. Negative for abdominal pain.  ?Neurological:  Negative for dizziness and headaches.  ? ? ?  Objective  ?  ?BP (!) 150/87   Pulse 82   Temp 97.9 ?F (36.6 ?C) (Oral)   Ht '5\' 10"'$  (1.778 m)   Wt 235 lb 6.4 oz (106.8 kg)   LMP 01/24/2011   SpO2 98%   BMI 33.78 kg/m?  ?Blood pressure (!)  150/87, pulse 82, temperature 97.9 ?F (36.6 ?C), temperature source Oral, height '5\' 10"'$  (1.778 m), weight 235 lb 6.4 oz (106.8 kg), last menstrual period 01/24/2011, SpO2 98 %.  ? ?Physical Exam ?Constitutional:   ?   General: She is awake.  ?   Appearance: She is well-developed.  ?HENT:  ?   Head: Normocephalic.  ?Eyes:  ?   Conjunctiva/sclera: Conjunctivae normal.  ?Cardiovascular:  ?   Rate and Rhythm: Normal rate and regular rhythm.  ?   Heart sounds: Normal heart sounds.  ?Pulmonary:  ?   Effort: Pulmonary effort is normal.  ?   Breath sounds: Normal breath sounds.  ?Skin: ?   General: Skin is warm.  ?Neurological:  ?   Mental Status: She is alert and oriented to person, place, and time.  ?Psychiatric:     ?   Attention and Perception: Attention normal.     ?   Mood and Affect: Mood normal.     ?   Speech: Speech normal.     ?   Behavior: Behavior is cooperative.  ?  ? ?No results found for any visits on 11/07/21. ? Assessment & Plan  ?  ? ?Problem List Items Addressed This Visit   ? ?  ? Cardiovascular and Mediastinum  ? Benign essential HTN  ?  Elevated today but will continue to monitor may be secondary to anxiety ? ?  ?  ?  ?  Digestive  ? GERD (gastroesophageal reflux disease) - Primary  ?  Following with GI and has planned endoscopy/colonoscopy this month ? ?  ?  ? Relevant Medications  ? omeprazole (PRILOSEC) 40 MG capsule  ? Na Sulfate-K Sulfate-Mg Sulf 17.5-3.13-1.6 GM/177ML SOLN  ? scopolamine (TRANSDERM-SCOP) 1 MG/3DAYS  ?  ? Other  ? Weight gain  ?  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 ?  ?  ? Abnormal mammogram  ?  Explained need for biopsy, what they see on mammo,etc. ?Pt feels comfortable and will wait for call to schedule a biopsy. ? ?  ?  ? Motion sickness  ?  Upcoming  cruise and nervous about seasickness ? rx scopolamine patch ? ?  ?  ? Relevant Medications  ? scopolamine (TRANSDERM-SCOP) 1 MG/3DAYS  ?  ? ?Return in about 3 months (around 02/07/2022) for weight Management, hypertension.  ?   ? ?I, [name], PA-C have reviewed all documentation for this visit. The documentation on  11/07/2021 for the exam, diagnosis, procedures, and orders are all accurate and complete. ? ?[name], PA-C ?[facility] ?[id number] ?[place], [place], [address] ?Office: [phone] ?Fax: [phone]  ? ?[facility] ? ?

## 2021-11-07 ENCOUNTER — Ambulatory Visit (INDEPENDENT_AMBULATORY_CARE_PROVIDER_SITE_OTHER): Payer: Medicare Other | Admitting: Physician Assistant

## 2021-11-07 ENCOUNTER — Encounter: Payer: Self-pay | Admitting: Physician Assistant

## 2021-11-07 VITALS — BP 150/87 | HR 82 | Temp 97.9°F | Ht 70.0 in | Wt 235.4 lb

## 2021-11-07 DIAGNOSIS — R928 Other abnormal and inconclusive findings on diagnostic imaging of breast: Secondary | ICD-10-CM | POA: Insufficient documentation

## 2021-11-07 DIAGNOSIS — T753XXA Motion sickness, initial encounter: Secondary | ICD-10-CM | POA: Diagnosis not present

## 2021-11-07 DIAGNOSIS — R635 Abnormal weight gain: Secondary | ICD-10-CM

## 2021-11-07 DIAGNOSIS — I10 Essential (primary) hypertension: Secondary | ICD-10-CM | POA: Diagnosis not present

## 2021-11-07 DIAGNOSIS — K219 Gastro-esophageal reflux disease without esophagitis: Secondary | ICD-10-CM | POA: Diagnosis not present

## 2021-11-07 MED ORDER — SCOPOLAMINE 1 MG/3DAYS TD PT72: 1.0000 | MEDICATED_PATCH | TRANSDERMAL | 1 refills | Status: DC

## 2021-11-07 NOTE — Assessment & Plan Note (Signed)
Elevated today but will continue to monitor may be secondary to anxiety ?

## 2021-11-07 NOTE — Assessment & Plan Note (Signed)
Explained need for biopsy, what they see on mammo,etc. ?Pt feels comfortable and will wait for call to schedule a biopsy. ?

## 2021-11-07 NOTE — Assessment & Plan Note (Signed)
Following with GI and has planned endoscopy/colonoscopy this month ?

## 2021-11-07 NOTE — Assessment & Plan Note (Signed)
Upcoming cruise and nervous about seasickness ? rx scopolamine patch ?

## 2021-11-07 NOTE — Assessment & Plan Note (Signed)
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

## 2021-11-15 ENCOUNTER — Ambulatory Visit: Payer: BLUE CROSS/BLUE SHIELD | Admitting: Physician Assistant

## 2021-11-15 DIAGNOSIS — M810 Age-related osteoporosis without current pathological fracture: Secondary | ICD-10-CM | POA: Insufficient documentation

## 2021-11-15 DIAGNOSIS — M8589 Other specified disorders of bone density and structure, multiple sites: Secondary | ICD-10-CM | POA: Insufficient documentation

## 2021-11-18 ENCOUNTER — Ambulatory Visit
Admission: RE | Admit: 2021-11-18 | Discharge: 2021-11-18 | Disposition: A | Discharge status: Home / Self Care | Payer: Medicare Other | Source: Ambulatory Visit | Attending: Physician Assistant | Admitting: Physician Assistant

## 2021-11-18 DIAGNOSIS — R928 Other abnormal and inconclusive findings on diagnostic imaging of breast: Secondary | ICD-10-CM | POA: Diagnosis present

## 2021-11-18 HISTORY — PX: BREAST BIOPSY: SHX20

## 2021-11-19 LAB — SURGICAL PATHOLOGY

## 2021-11-20 ENCOUNTER — Encounter: Payer: Self-pay | Admitting: Physician Assistant

## 2021-11-20 ENCOUNTER — Encounter: Payer: Self-pay | Admitting: *Deleted

## 2021-11-20 ENCOUNTER — Other Ambulatory Visit: Payer: Self-pay | Admitting: *Deleted

## 2021-11-20 DIAGNOSIS — C50919 Malignant neoplasm of unspecified site of unspecified female breast: Secondary | ICD-10-CM

## 2021-11-20 NOTE — Progress Notes (Signed)
Received referral for newly diagnosed breast cancer from [facility] Radiology.  Navigation initiated.  Med Onc and Surgical consults scheduled. Dr. Bary Castilla is scheduled for 11/20/21 and Dr. Janese Banks on 11/27/21.

## 2021-11-27 ENCOUNTER — Inpatient Hospital Stay: Payer: Medicare Other

## 2021-11-27 ENCOUNTER — Inpatient Hospital Stay: Payer: Medicare Other | Attending: Oncology | Admitting: Oncology

## 2021-11-27 ENCOUNTER — Encounter: Payer: Self-pay | Admitting: Oncology

## 2021-11-27 ENCOUNTER — Other Ambulatory Visit: Payer: Self-pay | Admitting: *Deleted

## 2021-11-27 VITALS — Temp 98.2°F | Resp 20 | Ht 70.0 in | Wt 236.6 lb

## 2021-11-27 DIAGNOSIS — C50812 Malignant neoplasm of overlapping sites of left female breast: Secondary | ICD-10-CM | POA: Diagnosis present

## 2021-11-27 DIAGNOSIS — Z170 Estrogen receptor positive status [ER+]: Secondary | ICD-10-CM | POA: Diagnosis not present

## 2021-11-27 DIAGNOSIS — Z7189 Other specified counseling: Secondary | ICD-10-CM

## 2021-11-27 DIAGNOSIS — Z780 Asymptomatic menopausal state: Secondary | ICD-10-CM

## 2021-11-27 DIAGNOSIS — C50919 Malignant neoplasm of unspecified site of unspecified female breast: Secondary | ICD-10-CM

## 2021-11-27 DIAGNOSIS — C50412 Malignant neoplasm of upper-outer quadrant of left female breast: Secondary | ICD-10-CM | POA: Insufficient documentation

## 2021-11-27 NOTE — Progress Notes (Signed)
Hematology/Oncology Consult note [facility] [phone] [phone]  Patient Care Team: Jerrol Banana., MD as PCP - General (Family Medicine) Daiva Huge, RN as Oncology Nurse Navigator   Name of the patient: Kathy Farmer  [id number]  Sep 10, 1955    Reason for referral-new diagnosis of breast cancer   Referring physician-Dr. Miguel Aschoff  Date of visit: 11/27/21   History of presenting illness- Patient is a 66 year old female who underwent routine screening bilateral mammogram in April 2023 which showed possible distortion in the left breast.  This was followed by diagnostic mammogram and ultrasound which showed architectural distortion in the lateral left breast at the 3 o'clock position.  There were a group of cysts measuring 7 x 2 x 3 mm.  Stereotactic biopsy of the architectural distortion was recommended.  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  Patient has met with Dr. Bary Castilla and plan is to proceed with a lumpectomy and a possible sentinel lymph node biopsy after she gets back from vacation.  Menarche at the age of 71.  Age at first pregnancy 46.  She is G2, P2.  Family history of breast cancer in her mother in her 35s.  Maternal aunt with breast cancer in her 38s.  No other family history of colon, ovarian, prostate cancer.  ECOG PS- 0  Pain scale- 0   Review of systems- Review of Systems  Constitutional:  Negative for chills, fever, malaise/fatigue and weight loss.  HENT:  Negative for congestion, ear discharge and nosebleeds.   Eyes:  Negative for blurred vision.  Respiratory:  Negative for cough, hemoptysis, sputum production, shortness of breath and wheezing.   Cardiovascular:  Negative for chest pain, palpitations, orthopnea and claudication.  Gastrointestinal:  Negative for abdominal pain, blood in stool, constipation, diarrhea, heartburn,  melena, nausea and vomiting.  Genitourinary:  Negative for dysuria, flank pain, frequency, hematuria and urgency.  Musculoskeletal:  Negative for back pain, joint pain and myalgias.  Skin:  Negative for rash.  Neurological:  Negative for dizziness, tingling, focal weakness, seizures, weakness and headaches.  Endo/Heme/Allergies:  Does not bruise/bleed easily.  Psychiatric/Behavioral:  Negative for depression and suicidal ideas. The patient does not have insomnia.    Allergies  Allergen Reactions   Vitamin B12 Other (See Comments)    Injection form only-had swollen in both eyes, nausea after getting second injection. Tolerates tablet form Injection form only-had swollen in both eyes, nausea after getting second injection. Tolerates tablet form    Patient Active Problem List   Diagnosis Date Noted   Abnormal mammogram 11/07/2021   Motion sickness 11/07/2021   LUQ abdominal pain 09/12/2021   Paresthesia of right foot 08/13/2021   B12 deficiency 11/20/2016   Clinical depression 07/12/2015   Diabetes ([place]) 07/12/2015   Elevation of level of transaminase or lactic acid dehydrogenase (LDH) 07/12/2015   GERD (gastroesophageal reflux disease) 07/12/2015   Benign essential HTN 07/12/2015   Adiposity 07/12/2015   Apnea, sleep 07/12/2015   Tension type headache 07/12/2015   Thoracic outlet syndrome 07/12/2015   Raynaud's phenomenon 12/15/2014   Leg cramps, sleep related 12/15/2014   Infraspinatus tenosynovitis 07/27/2014   Impingement syndrome of shoulder 07/27/2014   Closed fracture of glenoid cavity of scapula 07/27/2014   Shoulder subluxation 07/27/2014   Posterior tibial tendinitis 07/03/2014   Weight gain 03/16/2014   Bilateral polycystic ovarian syndrome 03/16/2014     Past Medical  History:  Diagnosis Date   Anemia    Depression    Dysrhythmia    racing heart- neg workup   GERD (gastroesophageal reflux disease)    Sleep apnea    inconclusive sleep study     Past  Surgical History:  Procedure Laterality Date   ABDOMINAL HYSTERECTOMY     BREAST BIOPSY Left 11/18/2021   lt br stereo, distortion, x clip, path pending.   CESAREAN SECTION     DIAGNOSTIC LAPAROSCOPY     DILATION AND CURETTAGE OF UTERUS  06/30/2009   with hysteroecopy   TUBAL LIGATION Bilateral     Social History   Socioeconomic History   Marital status: Married    Spouse name: Not on file   Number of children: 2   Years of education: Not on file   Highest education level: Not on file  Occupational History    Employer: [employer]  Tobacco Use   Smoking status: Never   Smokeless tobacco: Never  Substance and Sexual Activity   Alcohol use: Yes    Alcohol/week: 0.0 standard drinks    Comment: on occasion    Drug use: No   Sexual activity: Yes    Partners: Male  Other Topics Concern   Not on file  Social History Narrative   Not on file   Social Determinants of Health   Financial Resource Strain: Not on file  Food Insecurity: Not on file  Transportation Needs: Not on file  Physical Activity: Not on file  Stress: Not on file  Social Connections: Not on file  Intimate Partner Violence: Not on file     Family History  Problem Relation Age of Onset   Diabetes Father    Hypertension Father    Breast cancer Mother 45   Osteoporosis Mother    Breast cancer Maternal Aunt 24     Current Outpatient Medications:    esomeprazole (NEXIUM) 20 MG capsule, Take by mouth., Disp: , Rfl:    famotidine (PEPCID) 40 MG tablet, Take 80 mg by mouth daily., Disp: , Rfl:    losartan-hydrochlorothiazide (HYZAAR) 50-12.5 MG tablet, Take 1 tablet by mouth daily., Disp: 90 tablet, Rfl: 3   metFORMIN (GLUCOPHAGE) 500 MG tablet, Take 1 tablet (500 mg total) by mouth 2 (two) times daily with a meal. (Patient not taking: Reported on 11/07/2021), Disp: 180 tablet, Rfl: 1   Na Sulfate-K Sulfate-Mg Sulf 17.5-3.13-1.6 GM/177ML SOLN, Take by mouth., Disp: , Rfl:    omeprazole  (PRILOSEC) 40 MG capsule, , Disp: , Rfl:    pantoprazole (PROTONIX) 40 MG tablet, Take 1 tablet (40 mg total) by mouth daily., Disp: 90 tablet, Rfl: 1   scopolamine (TRANSDERM-SCOP) 1 MG/3DAYS, Place 1 patch (1.5 mg total) onto the skin every 3 (three) days., Disp: 10 patch, Rfl: 1   sucralfate (CARAFATE) 1 g tablet, Take 1 tablet (1 g total) by mouth 4 (four) times daily -  with meals and at bedtime for 15 days., Disp: 60 tablet, Rfl: 0   Physical exam:  Vitals:   11/27/21 1054  Resp: 20  Temp: 98.2 F (36.8 C)  Weight: 236 lb 9.6 oz (107.3 kg)  Height: '5\' 10"'  (1.778 m)   Physical Exam Cardiovascular:     Rate and Rhythm: Normal rate and regular rhythm.     Heart sounds: Normal heart sounds.  Pulmonary:     Effort: Pulmonary effort is normal.     Breath sounds: Normal breath sounds.  Abdominal:  General: Bowel sounds are normal.     Palpations: Abdomen is soft.  Skin:    General: Skin is warm and dry.  Neurological:     Mental Status: She is alert and oriented to person, place, and time.  Breast exam: No palpable masses in either breast.  No palpable bilateral axillary adenopathy.       Latest Ref Rng & Units 09/25/2021    9:13 AM  CMP  Glucose 70 - 99 mg/dL 100    BUN 8 - 27 mg/dL 15    Creatinine 0.57 - 1.00 mg/dL 1.42    Sodium 134 - 144 mmol/L 144    Potassium 3.5 - 5.2 mmol/L 4.0    Chloride 96 - 106 mmol/L 106    CO2 20 - 29 mmol/L 24    Calcium 8.7 - 10.3 mg/dL 9.5    Total Protein 6.0 - 8.5 g/dL 6.5    Total Bilirubin 0.0 - 1.2 mg/dL 0.5    Alkaline Phos 44 - 121 IU/L 73    AST 0 - 40 IU/L 23    ALT 0 - 32 IU/L 20        Latest Ref Rng & Units 11/15/2020   11:22 AM  CBC  WBC 3.4 - 10.8 x10E3/uL 6.2    Hemoglobin 11.1 - 15.9 g/dL 13.1    Hematocrit 34.0 - 46.6 % 38.5    Platelets 150 - 450 x10E3/uL 253      No images are attached to the encounter.  US BREAST LTD UNI LEFT INC AXILLA  Result Date: 11/05/2021 CLINICAL DATA:  Callback from  screening mammogram for possible architectural distortion left breast. EXAM: DIGITAL DIAGNOSTIC UNILATERAL LEFT MAMMOGRAM WITH TOMOSYNTHESIS AND CAD; ULTRASOUND LEFT BREAST LIMITED TECHNIQUE: Left digital diagnostic mammography and breast tomosynthesis was performed. The images were evaluated with computer-aided detection.; Targeted ultrasound examination of the left breast was performed. COMPARISON:  Previous exam(s). ACR Breast Density Category b: There are scattered areas of fibroglandular density. FINDINGS: Lateral view of left breast, spot compression cc and MLO views of the left breast are submitted. The previously questioned possible architectural distortion in the lateral left breast 3 o'clock is persistent. There is an adjacent probable cluster of cyst in the lateral left breast. Targeted ultrasound is performed, showing a group of cysts at the left breast 3 o'clock 9 cm from nipple measuring 7 x 2 x 3 mm correlating to the probable cluster of cysts seen on mammogram. No other focal abnormal discrete cystic or solid lesion is identified in the lateral left breast that would correlate to the mammographic possible architectural distortion. Ultrasound of the left axilla is negative. IMPRESSION: Suspicious findings. RECOMMENDATION: Stereotactic core biopsy of possible left breast architectural distortion. I have discussed the findings and recommendations with the patient. If applicable, a reminder letter will be sent to the patient regarding the next appointment. BI-RADS CATEGORY  4: Suspicious. Electronically Signed   By: Abelardo Diesel M.D.   On: 11/05/2021 16:11  MM DIAG BREAST TOMO UNI LEFT  Result Date: 11/05/2021 CLINICAL DATA:  Callback from screening mammogram for possible architectural distortion left breast. EXAM: DIGITAL DIAGNOSTIC UNILATERAL LEFT MAMMOGRAM WITH TOMOSYNTHESIS AND CAD; ULTRASOUND LEFT BREAST LIMITED TECHNIQUE: Left digital diagnostic mammography and breast tomosynthesis was  performed. The images were evaluated with computer-aided detection.; Targeted ultrasound examination of the left breast was performed. COMPARISON:  Previous exam(s). ACR Breast Density Category b: There are scattered areas of fibroglandular density. FINDINGS: Lateral view of left breast, spot  compression cc and MLO views of the left breast are submitted. The previously questioned possible architectural distortion in the lateral left breast 3 o'clock is persistent. There is an adjacent probable cluster of cyst in the lateral left breast. Targeted ultrasound is performed, showing a group of cysts at the left breast 3 o'clock 9 cm from nipple measuring 7 x 2 x 3 mm correlating to the probable cluster of cysts seen on mammogram. No other focal abnormal discrete cystic or solid lesion is identified in the lateral left breast that would correlate to the mammographic possible architectural distortion. Ultrasound of the left axilla is negative. IMPRESSION: Suspicious findings. RECOMMENDATION: Stereotactic core biopsy of possible left breast architectural distortion. I have discussed the findings and recommendations with the patient. If applicable, a reminder letter will be sent to the patient regarding the next appointment. BI-RADS CATEGORY  4: Suspicious. Electronically Signed   By: Abelardo Diesel M.D.   On: 11/05/2021 16:11  MM CLIP PLACEMENT LEFT  Result Date: 11/18/2021 CLINICAL DATA:  Status post 3D stereotactic guided core needle biopsy of a small area of asymmetry with possible architectural distortion in the posterior outer left breast. EXAM: 3D DIAGNOSTIC LEFT MAMMOGRAM POST STEREOTACTIC BIOPSY COMPARISON:  Previous exam(s). FINDINGS: 3D Mammographic images were obtained following 3D stereotactic guided biopsy of the recently demonstrated small area of asymmetry with possible architectural distortion in the posterior outer left breast. The biopsy marking clip is in expected position at the site of biopsy.  IMPRESSION: Appropriate positioning of the X shaped biopsy marking clip at the site of biopsy in the posterior outer left breast. Final Assessment: Post Procedure Mammograms for Marker Placement Electronically Signed   By: Claudie Revering M.D.   On: 11/18/2021 09:33  MM LT BREAST BX W LOC DEV 1ST LESION IMAGE BX SPEC STEREO GUIDE  Addendum Date: 11/20/2021   ADDENDUM REPORT: 11/20/2021 10:07 ADDENDUM: Pathology revealed GRADE I INVASIVE MAMMARY CARCINOMA, NO SPECIAL TYPE, LOW GRADE DUCTAL CARCINOMA IN SITU of the . This was found to be concordant by Dr. Claudie Revering. Pathology results were discussed with the patient by telephone. The patient reported doing well after the biopsy with tenderness at the site. Post biopsy instructions and care were reviewed and questions were answered. The patient was encouraged to call [facility] for any additional concerns. A surgical and medical oncologist referral will be arranged by Casper Harrison RN Oncology Nurse Navigator of [place]. Pathology results reported by Stacie Acres RN on 11/20/2021. Electronically Signed   By: Claudie Revering M.D.   On: 11/20/2021 10:07   Result Date: 11/20/2021 CLINICAL DATA:  Small asymmetry with questionable architectural distortion in the posterior outer left breast at recent mammography with no ultrasound correlate. EXAM: LEFT BREAST STEREOTACTIC CORE NEEDLE BIOPSY COMPARISON:  None Available. FINDINGS: The patient and I discussed the procedure of stereotactic-guided biopsy including benefits and alternatives. We discussed the high likelihood of a successful procedure. We discussed the risks of the procedure including infection, bleeding, tissue injury, clip migration, and inadequate sampling. Informed written consent was given. The usual time out protocol was performed immediately prior to the procedure. Using sterile technique and 1% Lidocaine as local  anesthetic, under stereotactic guidance, a 9 gauge vacuum assisted device was used to perform core needle biopsy of recently demonstrated small asymmetry with questionable architectural distortion in the posterior outer left breast using a cephalad approach. At the conclusion of the procedure, an X  shaped tissue marker clip was deployed into the biopsy cavity. Follow-up 2-view mammogram was performed and dictated separately. IMPRESSION: Stereotactic-guided biopsy of the recently demonstrated small asymmetry with questionable architectural distortion in the posterior outer left breast. No apparent complications. Electronically Signed: By: Claudie Revering M.D. On: 11/18/2021 09:32    Assessment and plan- Patient is a 66 y.o. 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  There are also group of cysts in that area measuring 7 x 2 x 3 mm.  Discussed with the patient that typically strongly ER/PR positive HER2 negative breast cancers which are grade 1 or biologically favorable and slow-growing.  First step would be upfront lumpectomy with consideration for sentinel lymph node biopsy.  There would be no role for staging scans and stage I disease.    I will see her back after final pathology is back to discuss if Oncotype testing is needed or not.  Discussed what Oncotype testing is and how the results are interpreted.  Final pathology shows a grade 1 tumor less than 10 mm or a grade 2 tumor less than 5 mm she does not require Oncotype testing.  Patient would benefit from adjuvant radiation treatment.  Given that her tumor is ER/PR positive there would also be a role for endocrine therapy upon completion of  radiation which I will discuss with her in greater detail down the line.  Endocrine therapy has a potential to worsen bone health and I will consider getting a baseline bone density scan at this time.  Treatment will be given with a curative intent  Given personal and family history of breast cancer I will also refer her for genetic counseling   Cancer Staging  Malignant neoplasm of upper-outer quadrant of left breast in female, estrogen receptor positive ([place]) Staging form: Breast, AJCC 8th Edition - Clinical stage from 11/27/2021: Stage IA (cT1b, cN0, cM0, G1, ER+, PR+, HER2-) - Signed by Sindy Guadeloupe, MD on 11/27/2021 Histologic grading system: 3 grade system     Thank you for this kind referral and the opportunity to participate in the care of this patient   Visit Diagnosis 1. Malignant neoplasm of upper-outer quadrant of left breast in female, estrogen receptor positive ([place])   2. Goals of care, counseling/discussion     Dr. Randa Evens, MD, MPH [facility] at [facility] [phone] 11/27/2021

## 2021-12-04 ENCOUNTER — Other Ambulatory Visit: Payer: Self-pay | Admitting: General Surgery

## 2021-12-04 DIAGNOSIS — Z170 Estrogen receptor positive status [ER+]: Secondary | ICD-10-CM

## 2021-12-04 NOTE — Progress Notes (Signed)
Subjective:     Patient ID: Kathy Farmer is a 66 y.o. female.   HPI   The following portions of the patient's history were reviewed and updated as appropriate.   This a new patient is here today for: office visit. Here for evaluation of newly diagnosis of left breast cancer. She states that mammogram found the area.  The patient reports that for the last several years she been called back frequently for additional views, confirmed on review of the imaging studies dating back to December 2020.   Family history of her mother having breast cancer in her 76s and a maternal aunt with breast cancer in her 56s.  No family history of colon cancer.   She is here today with her husband, Kathy Farmer.   Review of Systems Constitutional: Negative for chills and fever. Respiratory: Negative for cough.          Chief Complaint  Patient presents with   New Patient      BP 132/82   Pulse 87   Temp 36.7 C (98 F)   Ht 177.8 cm ('5\' 10"' )   Wt (!) 106.1 kg (234 lb)   LMP  (LMP Unknown)   SpO2 97%   BMI 33.58 kg/m        Past Medical History:  Diagnosis Date   Abnormal weight gain     Anemia     Depression     Fractured shoulder     GERD (gastroesophageal reflux disease)     PCOS (polycystic ovarian syndrome)     Prediabetes     Sleep apnea             Past Surgical History:  Procedure Laterality Date   COLONOSCOPY   05/10/2008    Internal hemorrhoids: CBF 04/2018: Recall ltr mailed 04/16/18   D&C   06/30/2009   COLONOSCOPY   09/16/2018    Hyperplastic Polyp: CBF 08/2028   EGD   09/16/2018    Gastritis: No repeat per RTE    BIOPSY BREAST W/ LOC DEVICE PLACEMENT AND STEREOTACTIC GUIDANCE Left 11/18/2021   COLONOSCOPY   11/19/2021    Dr Alice Reichert   CESAREAN SECTION       COLONOSCOPY       DIAGNOSTIC LAPAROSCOPY       Fibroids       HYSTERECTOMY       Tubal Ligation Bilateral                  OB History     Gravida  2   Para  2   Term      Preterm      AB       Living         SAB      IAB      Ectopic      Molar      Multiple      Live Births           Obstetric Comments  Age at first period 19 Age of first pregnancy 63               Social History           Socioeconomic History   Marital status: Married  Tobacco Use   Smoking status: Never   Smokeless tobacco: Never  Vaping Use   Vaping Use: Never used  Substance and Sexual Activity   Alcohol use: Yes      Alcohol/week: 1.0 standard drink  Types: 1 Glasses of wine per week   Drug use: No   Sexual activity: Defer        No Known Allergies   Current Medications        Current Outpatient Medications  Medication Sig Dispense Refill   esomeprazole (NEXIUM) 20 MG DR capsule Take 20 mg by mouth once daily       losartan-hydrochlorothiazide (HYZAAR) 50-12.5 mg tablet Take 1 tablet by mouth once daily.       famotidine (PEPCID) 40 MG tablet Take 1 tablet (40 mg total) by mouth at bedtime (Patient not taking: Reported on 11/21/2021) 30 tablet 3   omeprazole (PRILOSEC) 40 MG DR capsule  (Patient not taking: Reported on 11/21/2021)       sodium, potassium, and magnesium (SUPREP) oral solution Take 1 Bottle by mouth as directed One kit contains 2 bottles.  Take both bottles at the times instructed by your provider. (Patient not taking: Reported on 11/21/2021) 354 mL 0    No current facility-administered medications for this visit.             Family History  Problem Relation Age of Onset   Dementia Mother     Breast cancer Mother 59   Osteoporosis (Thinning of bones) Mother     High blood pressure (Hypertension) Father     Diabetes Father     Heart disease Father     Breast cancer Maternal Aunt 80   Colon cancer Neg Hx          Labs and Radiology:    Breast imaging study review:   Images from December 2020 through May 2023 were independently reviewed.   Nov 18, 2021 pathology:   . BREAST, LEFT POSTERIOR OUTER; STEREOTACTIC CORE NEEDLE BIOPSY:   -  INVASIVE MAMMARY CARCINOMA, NO SPECIAL TYPE.     Size of invasive carcinoma: 6 mm in this sample   Histologic grade of invasive carcinoma: Grade 1                        Glandular/tubular differentiation score: 2                        Nuclear pleomorphism score: 1                        Mitotic rate score: 1                        Total score: 4   Ductal carcinoma in situ: Present, low-grade   Lymphovascular invasion: Not identified  Estrogen Receptor (ER) Status: POSITIVE          Percentage of cells with nuclear positivity: 81-90%          Average intensity of staining: Strong    Progesterone Receptor (PgR) Status: POSITIVE          Percentage of cells with nuclear positivity: Greater than 90%          Average intensity of staining: Strong    HER2 (by immunohistochemistry): NEGATIVE (Score 0)  Ki-67: Not performed     Objective:   Physical Exam Exam conducted with a chaperone present.  Constitutional:      Appearance: Normal appearance.  Cardiovascular:     Rate and Rhythm: Normal rate and regular rhythm.     Pulses: Normal pulses.     Heart sounds: Normal heart sounds.  Pulmonary:     Effort: Pulmonary effort is normal.     Breath sounds: Normal breath sounds.  Chest:  Breasts:    Right: Normal.     Left: Normal.     Comments: Minimal bruising in the upper outer quadrant of the left breast.  No palpable hematoma. Musculoskeletal:     Cervical back: Neck supple.  Lymphadenopathy:     Upper Body:     Right upper body: No supraclavicular or axillary adenopathy.     Left upper body: No supraclavicular or axillary adenopathy.  Skin:    General: Skin is warm and dry.  Neurological:     Mental Status: She is alert and oriented to person, place, and time.  Psychiatric:        Mood and Affect: Mood normal.        Behavior: Behavior normal.           Assessment:     Clinical stage I carcinoma of the left breast.   Possible desire for breast volume reduction with  resection/symmetry surgery.    Plan:     The better part of an hour was spent reviewing options for management.  Breast conservation and mastectomy were presented therapeutically equivalent.  The patient brought up the possibility of having breast reduction done at the time of resection of her cancer and contralateral symmetry surgery.  This would be best reviewed with plastic surgery and certainly could be accommodated.   The patient and her husband have a [place] scheduled in the next month and is no reason that they should not enjoyed this.  This early stage grade 1 ER positive tumor will "not going anywhere" in the interval.   She has an appointment medical oncology next week.   If she desires to meet with plastic surgery we will make arrangements for her to meet with Kathy Hives, DO.      This note is partially prepared by Karie Fetch, RN, acting as a scribe in the presence of Dr. Hervey Ard, MD.  The documentation recorded by the scribe accurately reflects the service I personally performed and the decisions made by me.    Robert Bellow, MD FACS  The patient has elected not to undergo breast reduction and symmetry surgery at this time.

## 2021-12-09 ENCOUNTER — Other Ambulatory Visit: Payer: Self-pay | Admitting: General Surgery

## 2021-12-09 DIAGNOSIS — Z170 Estrogen receptor positive status [ER+]: Secondary | ICD-10-CM

## 2021-12-16 ENCOUNTER — Encounter
Admission: RE | Admit: 2021-12-16 | Discharge: 2021-12-16 | Disposition: A | Discharge status: Home / Self Care | Payer: Medicare Other | Source: Ambulatory Visit | Attending: General Surgery | Admitting: General Surgery

## 2021-12-16 ENCOUNTER — Other Ambulatory Visit: Payer: Self-pay

## 2021-12-16 HISTORY — DX: Personal history of other diseases of the female genital tract: Z87.42

## 2021-12-16 HISTORY — DX: Essential (primary) hypertension: I10

## 2021-12-16 HISTORY — DX: Prediabetes: R73.03

## 2021-12-16 NOTE — Patient Instructions (Addendum)
Your procedure is scheduled on: 12/23/21 Report to [facility]. (You will likely start out at the [facility] first) To find out your arrival time please call [phone] between 1PM - 3PM on 12/20/21.  Remember: Instructions that are not followed completely may result in serious medical risk, up to and including death, or upon the discretion of your surgeon and anesthesiologist your surgery may need to be rescheduled.     _X__ 1. Do not eat food after midnight the night before your procedure.                 No gum chewing or hard candies. You may drink clear liquids up to 2 hours                 before you are scheduled to arrive for your surgery- DO not drink clear                 liquids within 2 hours of the start of your surgery.                 Clear Liquids include:  water, apple juice without pulp, clear carbohydrate                 drink such as Clearfast or Gatorade, Black Coffee or Tea (Do not add                 anything to coffee or tea). Diabetics water only  __X__2.  On the morning of surgery brush your teeth with toothpaste and water, you                 may rinse your mouth with mouthwash if you wish.  Do not swallow any              toothpaste of mouthwash.     _X__ 3.  No Alcohol for 24 hours before or after surgery.   _X__ 4.  Do Not Smoke or use e-cigarettes For 24 Hours Prior to Your Surgery.                 Do not use any chewable tobacco products for at least 6 hours prior to                 surgery.  ____  5.  Bring all medications with you on the day of surgery if instructed.   __X__  6.  Notify your doctor if there is any change in your medical condition      (cold, fever, infections).     Do not wear jewelry, make-up, hairpins, clips or nail polish. Do not wear lotions, powders, or perfumes. NO DEODORANT Do not shave BODY HAIR 48 hours prior to surgery. Men may shave face and neck. Do not bring  valuables to the hospital.    [facility] is not responsible for any belongings or valuables.  Contacts, dentures/partials or body piercings may not be worn into surgery. Bring a case for your contacts, glasses or hearing aids, a denture cup will be supplied. Leave your suitcase in the car. After surgery it may be brought to your room. For patients admitted to the hospital, discharge time is determined by your treatment team.   Patients discharged the day of surgery will not be allowed to drive home.   Please read over the following fact sheets that you were given:     __X__ Take these  medicines the morning of surgery with A SIP OF WATER:    1. pepcid  2.   3.   4.  5.  6.  ____ Fleet Enema (as directed)   ____ Use CHG Soap/SAGE wipes as directed  ____ Use inhalers on the day of surgery  ____ Stop metformin/Janumet/Farxiga 2 days prior to surgery    ____ Take 1/2 of usual insulin dose the night before surgery. No insulin the morning          of surgery.   ____ Stop Blood Thinners Coumadin/Plavix/Xarelto/Pleta/Pradaxa/Eliquis/Effient/Aspirin  on   Or contact your Surgeon, Cardiologist or Medical Doctor regarding  ability to stop your blood thinners  __X__ Stop Anti-inflammatories 7 days before surgery such as Advil, Ibuprofen, Motrin,  BC or Goodies Powder, Naprosyn, Naproxen, Aleve, Aspirin    __X__ Stop all herbal supplements, fish oil or vitamin E until after surgery.    ____ Bring C-Pap to the hospital.    SHOWER WITH "GOLD" BAR DIAL SOAP FOR 3-4 DAYS BEFORE SURGERY AND THE MORNING OF SURGERY.

## 2021-12-17 ENCOUNTER — Telehealth: Payer: Self-pay

## 2021-12-17 NOTE — Telephone Encounter (Signed)
Research nurse called patient to review the [employer] protocol with her. Message left for her to return call to the research office.  Kathy Farmer 12/17/21 1:54 PM

## 2021-12-18 ENCOUNTER — Inpatient Hospital Stay (HOSPITAL_BASED_OUTPATIENT_CLINIC_OR_DEPARTMENT_OTHER): Payer: Medicare Other | Admitting: Licensed Clinical Social Worker

## 2021-12-18 ENCOUNTER — Encounter: Payer: Self-pay | Admitting: Licensed Clinical Social Worker

## 2021-12-18 ENCOUNTER — Inpatient Hospital Stay: Payer: Medicare Other | Attending: Oncology | Admitting: *Deleted

## 2021-12-18 ENCOUNTER — Inpatient Hospital Stay: Payer: Medicare Other

## 2021-12-18 DIAGNOSIS — Z170 Estrogen receptor positive status [ER+]: Secondary | ICD-10-CM | POA: Diagnosis not present

## 2021-12-18 DIAGNOSIS — C50412 Malignant neoplasm of upper-outer quadrant of left female breast: Secondary | ICD-10-CM | POA: Diagnosis not present

## 2021-12-18 DIAGNOSIS — Z800 Family history of malignant neoplasm of digestive organs: Secondary | ICD-10-CM | POA: Diagnosis not present

## 2021-12-18 DIAGNOSIS — Z803 Family history of malignant neoplasm of breast: Secondary | ICD-10-CM | POA: Diagnosis not present

## 2021-12-18 NOTE — Progress Notes (Signed)
REFERRING PROVIDER: Sindy Guadeloupe, MD [place],  [place] [address]  PRIMARY PROVIDER:  Jerrol Banana., MD  PRIMARY REASON FOR VISIT:  1. Malignant neoplasm of upper-outer quadrant of left breast in female, estrogen receptor positive ([place])   2. Family history of breast cancer   3. Family history of pancreatic cancer   4. Family history of cholangiocarcinoma      HISTORY OF PRESENT ILLNESS:   Kathy Farmer, a 66 y.o. female, was seen for a [place] cancer genetics consultation at the request of Dr. Janese Banks due to a personal and family history of cancer.  Ms. Wernli presents to clinic today to discuss the possibility of a hereditary predisposition to cancer, genetic testing, and to further clarify her future cancer risks, as well as potential cancer risks for family members.   In 2023, at the age of 60, Ms. Nusz was diagnosed with invasive mammary carcinoma of the left breast. The treatment plan includes lumpectomy, scheduled for 6/26, possible Oncotype, adjuvant radiation, and adjuvant endocrine therapy.   CANCER HISTORY:  Oncology History  Malignant neoplasm of upper-outer quadrant of left breast in female, estrogen receptor positive ([place])  11/27/2021 Initial Diagnosis   Malignant neoplasm of upper-outer quadrant of left breast in female, estrogen receptor positive ([place])   11/27/2021 Cancer Staging   Staging form: Breast, AJCC 8th Edition - Clinical stage from 11/27/2021: Stage IA (cT1b, cN0, cM0, G1, ER+, PR+, HER2-) - Signed by Sindy Guadeloupe, MD on 11/27/2021 Histologic grading system: 3 grade system      RISK FACTORS:  Menarche was at age 51.  First live birth at age 26.  Ovaries intact: no.  Hysterectomy: yes.  Menopausal status: postmenopausal.  Colonoscopy: yes; normal.  Past Medical History:  Diagnosis Date   Anemia    Depression    Dysrhythmia    racing heart- neg workup   GERD (gastroesophageal reflux disease)    History of PCOS     Hypertension    Pre-diabetes    Sleep apnea    inconclusive sleep study    Past Surgical History:  Procedure Laterality Date   ABDOMINAL HYSTERECTOMY     BREAST BIOPSY Left 11/18/2021   lt br stereo, distortion, x clip, path pending.   CESAREAN SECTION     DIAGNOSTIC LAPAROSCOPY     DILATION AND CURETTAGE OF UTERUS  06/30/2009   with hysteroecopy   TUBAL LIGATION Bilateral     Social History   Socioeconomic History   Marital status: Married    Spouse name: Not on file   Number of children: 2   Years of education: Not on file   Highest education level: Not on file  Occupational History    Employer: [employer]  Tobacco Use   Smoking status: Never   Smokeless tobacco: Never  Vaping Use   Vaping Use: Never used  Substance and Sexual Activity   Alcohol use: Yes    Alcohol/week: 0.0 standard drinks of alcohol    Comment: on occasion    Drug use: No   Sexual activity: Yes    Partners: Male  Other Topics Concern   Not on file  Social History Narrative   Not on file   Social Determinants of Health   Financial Resource Strain: Not on file  Food Insecurity: Not on file  Transportation Needs: Not on file  Physical Activity: Not on file  Stress: Not on file  Social Connections: Not on file  FAMILY HISTORY:  We obtained a detailed, 4-generation family history.  Significant diagnoses are listed below: Family History  Problem Relation Age of Onset   Breast cancer Mother 42   Osteoporosis Mother    Diabetes Father    Hypertension Father    Cancer Brother        cholangiocarcinoma   Breast cancer Maternal Aunt 78   Pancreatic cancer Maternal Uncle    Lung cancer Maternal Grandfather    Pancreatic cancer Paternal Grandmother    Cancer - Other Paternal Grandmother        cholangiocarcinoma   Ms. Clune has 2 sons, 1 and 31. Her younger son has a diagnosis of ankylosing spondylitis and tested positive for a gene associated with this. Patient has 2  brothers. One brother had cholangiocarcinoma, history of alcohol use, and passed at 4.  Ms. Bradt mother had breast cancer at 74 and passed at 69. Patient had 3 maternal aunts, 1 uncle. Her uncle had pancreatic cancer and passed at 79, he also had exposure to agent orange in [place]. An aunt had breast cancer at 12 and passed at 11. Maternal grandmother passed at 13. Grandfather had lung cancer and passed at 42, history of smoking.   Ms. Sane father died at 51 of an aortic aneurysm. He was an only child. Paternal grandmother had cholangiocarcinoma and pancreatic cancer and passed at 49. Grandfather also passed of aortic aneurysm.  Ms. Din is unaware of previous family history of genetic testing for hereditary cancer risks. There is no reported Ashkenazi Jewish ancestry. There is no known consanguinity.    GENETIC COUNSELING ASSESSMENT: Ms. Bernardini is a 66 y.o. female with a personal and family history of breast cancer which is somewhat suggestive of a hereditary cancer syndrome and predisposition to cancer. We, therefore, discussed and recommended the following at today's visit.   DISCUSSION: We discussed that approximately 10% of breast cancer is hereditary. Most cases of hereditary breast cancer are associated with BRCA1/BRCA2 genes, although there are other genes associated with hereditary cancer as well. Cancers and risks are gene specific. We discussed that testing is beneficial for several reasons including knowing about cancer risks, identifying potential screening and risk-reduction options that may be appropriate, and to understand if other family members could be at risk for cancer and allow them to undergo genetic testing.   We reviewed the characteristics, features and inheritance patterns of hereditary cancer syndromes. We also discussed genetic testing, including the appropriate family members to test, the process of testing, insurance coverage and turn-around-time for results.  We discussed the implications of a negative, positive and/or variant of uncertain significant result. We recommended Ms. Kelvin Cellar pursue genetic testing for the [facility] Multi-Cancer+RNA gene panel.   Based on Ms. Radigan's personal and family history of cancer, she meets medical criteria for genetic testing. Despite that she meets criteria, she may still have an out of pocket cost. We discussed that if her out of pocket cost for testing is over $100, the laboratory will call and confirm whether she wants to proceed with testing.  If the out of pocket cost of testing is less than $100 she will be billed by the genetic testing laboratory.   PLAN: After considering the risks, benefits, and limitations, Ms. Authier provided informed consent to pursue genetic testing and the blood sample was sent to [facility] for analysis of the Multi-Cancer+RNA panel. Results should be available within approximately 2-3 weeks' time, at which point they will be disclosed by telephone  to Ms. Due, as will any additional recommendations warranted by these results. Ms. Lukasiewicz will receive a summary of her genetic counseling visit and a copy of her results once available. This information will also be available in Epic.   Ms. Royal questions were answered to her satisfaction today. Our contact information was provided should additional questions or concerns arise. Thank you for the referral and allowing [place] to share in the care of your patient.   Faith Rogue, MS, [facility] Genetic Counselor [email or web address] Phone: [phone]  The patient was seen for a total of 30 minutes in face-to-face genetic counseling.  Dr. Grayland Ormond was available for discussion regarding this case.   _______________________________________________________________________ For Office Staff:  Number of people involved in session: 2 Was an Intern/ student involved with case: yes

## 2021-12-18 NOTE — Progress Notes (Signed)
Multidisciplinary Oncology Council Documentation  Kathy Farmer was presented by our [facility] on 12/18/2021, which included representatives from:  Palliative Care Dietitian  Physical/Occupational Therapist Nurse Navigator Genetics Speech Therapist Social work Survivorship RN Financial Navigator Research RN   Kathy Farmer currently presents with history of breast cancer.  We reviewed previous medical and familial history, history of present illness, and recent lab results along with all available histopathologic and imaging studies. The [place] considered available treatment options and made the following recommendations/referrals:  Rehab Screen  The MOC is a meeting of clinicians from various specialty areas who evaluate and discuss patients for whom a multidisciplinary approach is being considered. Final determinations in the plan of care are those of the provider(s).   Today's extended care, comprehensive team conference, Kathy Farmer was not present for the discussion and was not examined.

## 2021-12-22 MED ORDER — LACTATED RINGERS IV SOLN: INTRAVENOUS | Status: DC

## 2021-12-22 MED ORDER — CHLORHEXIDINE GLUCONATE 0.12 % MT SOLN: 15.0000 mL | Freq: Once | OROMUCOSAL | Status: AC

## 2021-12-22 MED ORDER — CHLORHEXIDINE GLUCONATE CLOTH 2 % EX PADS: 6.0000 | MEDICATED_PAD | Freq: Once | CUTANEOUS | Status: DC

## 2021-12-22 MED ORDER — ORAL CARE MOUTH RINSE: 15.0000 mL | Freq: Once | OROMUCOSAL | Status: AC

## 2021-12-23 ENCOUNTER — Ambulatory Visit: Payer: Medicare Other | Admitting: Certified Registered"

## 2021-12-23 ENCOUNTER — Ambulatory Visit
Admission: RE | Admit: 2021-12-23 | Discharge: 2021-12-23 | Disposition: A | Discharge status: Home / Self Care | Payer: Medicare Other | Attending: General Surgery | Admitting: General Surgery

## 2021-12-23 ENCOUNTER — Encounter
Admission: RE | Disposition: A | Discharge status: Home / Self Care | Payer: Self-pay | Source: Home / Self Care | Attending: General Surgery

## 2021-12-23 ENCOUNTER — Other Ambulatory Visit: Payer: Self-pay

## 2021-12-23 ENCOUNTER — Ambulatory Visit
Admission: RE | Admit: 2021-12-23 | Discharge: 2021-12-23 | Disposition: A | Discharge status: Home / Self Care | Payer: Medicare Other | Source: Ambulatory Visit | Attending: General Surgery | Admitting: General Surgery

## 2021-12-23 ENCOUNTER — Encounter: Payer: Self-pay | Admitting: General Surgery

## 2021-12-23 DIAGNOSIS — I10 Essential (primary) hypertension: Secondary | ICD-10-CM | POA: Diagnosis not present

## 2021-12-23 DIAGNOSIS — Z6833 Body mass index (BMI) 33.0-33.9, adult: Secondary | ICD-10-CM | POA: Diagnosis not present

## 2021-12-23 DIAGNOSIS — E669 Obesity, unspecified: Secondary | ICD-10-CM | POA: Insufficient documentation

## 2021-12-23 DIAGNOSIS — C50412 Malignant neoplasm of upper-outer quadrant of left female breast: Secondary | ICD-10-CM

## 2021-12-23 DIAGNOSIS — C50512 Malignant neoplasm of lower-outer quadrant of left female breast: Secondary | ICD-10-CM | POA: Insufficient documentation

## 2021-12-23 HISTORY — PX: BREAST LUMPECTOMY: SHX2

## 2021-12-23 HISTORY — PX: AXILLARY SENTINEL NODE BIOPSY: SHX5738

## 2021-12-23 HISTORY — PX: BREAST LUMPECTOMY WITH NEEDLE LOCALIZATION: SHX5759

## 2021-12-23 SURGERY — BREAST LUMPECTOMY WITH NEEDLE LOCALIZATION
Anesthesia: General | Site: Breast | Laterality: Left

## 2021-12-23 MED ORDER — SODIUM CHLORIDE (PF) 0.9 % IJ SOLN
INTRAMUSCULAR | Status: AC
  Filled 2021-12-23: qty 10

## 2021-12-23 MED ORDER — METHYLENE BLUE 1 % INJ SOLN
INTRAVENOUS | Status: AC
  Filled 2021-12-23: qty 10

## 2021-12-23 MED ORDER — HYDROCODONE-ACETAMINOPHEN 5-325 MG PO TABS: 1.0000 | ORAL_TABLET | ORAL | 0 refills | Status: DC | PRN

## 2021-12-23 MED ORDER — DROPERIDOL 2.5 MG/ML IJ SOLN: 0.6250 mg | Freq: Once | INTRAMUSCULAR | Status: DC | PRN

## 2021-12-23 MED ORDER — OXYCODONE HCL 5 MG/5ML PO SOLN: 5.0000 mg | Freq: Once | ORAL | Status: AC | PRN

## 2021-12-23 MED ORDER — ONDANSETRON HCL 4 MG/2ML IJ SOLN
INTRAMUSCULAR | Status: DC | PRN
  Administered 2021-12-23: 4 mg via INTRAVENOUS

## 2021-12-23 MED ORDER — MIDAZOLAM HCL 2 MG/2ML IJ SOLN
INTRAMUSCULAR | Status: DC | PRN
  Administered 2021-12-23: 2 mg via INTRAVENOUS

## 2021-12-23 MED ORDER — LIDOCAINE HCL (CARDIAC) PF 100 MG/5ML IV SOSY
PREFILLED_SYRINGE | INTRAVENOUS | Status: DC | PRN
  Administered 2021-12-23: 100 mg via INTRAVENOUS

## 2021-12-23 MED ORDER — BUPIVACAINE-EPINEPHRINE (PF) 0.5% -1:200000 IJ SOLN
INTRAMUSCULAR | Status: AC
  Filled 2021-12-23: qty 30

## 2021-12-23 MED ORDER — CHLORHEXIDINE GLUCONATE 0.12 % MT SOLN
OROMUCOSAL | Status: AC
  Administered 2021-12-23: 15 mL via OROMUCOSAL
  Filled 2021-12-23: qty 15

## 2021-12-23 MED ORDER — LACTATED RINGERS IV SOLN: INTRAVENOUS | Status: DC | PRN

## 2021-12-23 MED ORDER — ACETAMINOPHEN 10 MG/ML IV SOLN
INTRAVENOUS | Status: DC | PRN
  Administered 2021-12-23: 1000 mg via INTRAVENOUS

## 2021-12-23 MED ORDER — FENTANYL CITRATE (PF) 100 MCG/2ML IJ SOLN
INTRAMUSCULAR | Status: AC
  Filled 2021-12-23: qty 2

## 2021-12-23 MED ORDER — DEXAMETHASONE SODIUM PHOSPHATE 10 MG/ML IJ SOLN
INTRAMUSCULAR | Status: DC | PRN
  Administered 2021-12-23: 8 mg via INTRAVENOUS

## 2021-12-23 MED ORDER — MIDAZOLAM HCL 2 MG/2ML IJ SOLN
INTRAMUSCULAR | Status: AC
  Filled 2021-12-23: qty 2

## 2021-12-23 MED ORDER — ACETAMINOPHEN 10 MG/ML IV SOLN: 1000.0000 mg | Freq: Once | INTRAVENOUS | Status: DC | PRN

## 2021-12-23 MED ORDER — METHYLENE BLUE 1 % INJ SOLN
INTRAVENOUS | Status: DC | PRN
  Administered 2021-12-23: 10 mL

## 2021-12-23 MED ORDER — EPHEDRINE SULFATE (PRESSORS) 50 MG/ML IJ SOLN
INTRAMUSCULAR | Status: DC | PRN
  Administered 2021-12-23: 5 mg via INTRAVENOUS
  Administered 2021-12-23 (×2): 10 mg via INTRAVENOUS

## 2021-12-23 MED ORDER — STERILE WATER FOR IRRIGATION IR SOLN
Status: DC | PRN
  Administered 2021-12-23: 500 mL

## 2021-12-23 MED ORDER — BUPIVACAINE-EPINEPHRINE (PF) 0.5% -1:200000 IJ SOLN
INTRAMUSCULAR | Status: DC | PRN
  Administered 2021-12-23: 10 mL
  Administered 2021-12-23: 20 mL

## 2021-12-23 MED ORDER — PHENYLEPHRINE HCL (PRESSORS) 10 MG/ML IV SOLN
INTRAVENOUS | Status: DC | PRN
  Administered 2021-12-23 (×3): 160 ug via INTRAVENOUS

## 2021-12-23 MED ORDER — FENTANYL CITRATE (PF) 100 MCG/2ML IJ SOLN
INTRAMUSCULAR | Status: DC | PRN
Start: 2021-12-23 — End: 2021-12-23
  Administered 2021-12-23 (×2): 50 ug via INTRAVENOUS

## 2021-12-23 MED ORDER — ROCURONIUM BROMIDE 100 MG/10ML IV SOLN
INTRAVENOUS | Status: DC | PRN
  Administered 2021-12-23: 50 mg via INTRAVENOUS

## 2021-12-23 MED ORDER — OXYCODONE HCL 5 MG PO TABS
5.0000 mg | ORAL_TABLET | Freq: Once | ORAL | Status: AC | PRN
  Administered 2021-12-23: 5 mg via ORAL

## 2021-12-23 MED ORDER — PHENYLEPHRINE HCL-NACL 20-0.9 MG/250ML-% IV SOLN
INTRAVENOUS | Status: DC | PRN
  Administered 2021-12-23: 50 ug/min via INTRAVENOUS

## 2021-12-23 MED ORDER — SUGAMMADEX SODIUM 200 MG/2ML IV SOLN
INTRAVENOUS | Status: DC | PRN
  Administered 2021-12-23: 200 mg via INTRAVENOUS

## 2021-12-23 MED ORDER — PROMETHAZINE HCL 25 MG/ML IJ SOLN: 6.2500 mg | INTRAMUSCULAR | Status: DC | PRN

## 2021-12-23 MED ORDER — TECHNETIUM TC 99M TILMANOCEPT KIT
1.0000 | PACK | Freq: Once | INTRAVENOUS | Status: AC | PRN
  Administered 2021-12-23: 1.1 via INTRADERMAL

## 2021-12-23 MED ORDER — PROPOFOL 10 MG/ML IV BOLUS
INTRAVENOUS | Status: DC | PRN
  Administered 2021-12-23: 150 mg via INTRAVENOUS

## 2021-12-23 MED ORDER — FENTANYL CITRATE (PF) 100 MCG/2ML IJ SOLN
25.0000 ug | INTRAMUSCULAR | Status: DC | PRN
  Administered 2021-12-23 (×4): 25 ug via INTRAVENOUS

## 2021-12-23 SURGICAL SUPPLY — 56 items
APL PRP STRL LF DISP 70% ISPRP (MISCELLANEOUS) ×2
BINDER BREAST XLRG (GAUZE/BANDAGES/DRESSINGS) ×1 IMPLANT
BLADE SURG 15 STRL SS SAFETY (BLADE) ×6 IMPLANT
BULB RESERV EVAC DRAIN JP 100C (MISCELLANEOUS) IMPLANT
CHLORAPREP W/TINT 26 (MISCELLANEOUS) ×3 IMPLANT
CNTNR SPEC 2.5X3XGRAD LEK (MISCELLANEOUS) ×2
CONT SPEC 4OZ STER OR WHT (MISCELLANEOUS) ×1
CONT SPEC 4OZ STRL OR WHT (MISCELLANEOUS) ×2
CONTAINER SPEC 2.5X3XGRAD LEK (MISCELLANEOUS) IMPLANT
COVER PROBE FLX POLY STRL (MISCELLANEOUS) ×3 IMPLANT
COVER PROBE ULTRASOUND XLONG L (MISCELLANEOUS) ×1 IMPLANT
DEVICE DUBIN SPECIMEN MAMMOGRA (MISCELLANEOUS) ×3 IMPLANT
DRAIN CHANNEL JP 15F RND 16 (MISCELLANEOUS) IMPLANT
DRAPE LAPAROTOMY TRNSV 106X77 (MISCELLANEOUS) ×3 IMPLANT
DRSG GAUZE FLUFF 36X18 (GAUZE/BANDAGES/DRESSINGS) ×7 IMPLANT
DRSG TELFA 3X8 NADH (GAUZE/BANDAGES/DRESSINGS) ×3 IMPLANT
ELECT CAUTERY BLADE TIP 2.5 (TIP) ×3
ELECT REM PT RETURN 9FT ADLT (ELECTROSURGICAL) ×3
ELECTRODE CAUTERY BLDE TIP 2.5 (TIP) ×2 IMPLANT
ELECTRODE REM PT RTRN 9FT ADLT (ELECTROSURGICAL) ×2 IMPLANT
GLOVE BIO SURGEON STRL SZ7.5 (GLOVE) ×4 IMPLANT
GLOVE SURG UNDER LTX SZ8 (GLOVE) ×4 IMPLANT
GOWN STRL REUS W/ TWL LRG LVL3 (GOWN DISPOSABLE) ×4 IMPLANT
GOWN STRL REUS W/TWL LRG LVL3 (GOWN DISPOSABLE) ×6
KIT TURNOVER KIT A (KITS) ×3 IMPLANT
LABEL OR SOLS (LABEL) ×3 IMPLANT
MANIFOLD NEPTUNE II (INSTRUMENTS) ×3 IMPLANT
MARGIN MAP 10MM (MISCELLANEOUS) ×3 IMPLANT
NDL HYPO 25X1 1.5 SAFETY (NEEDLE) ×4 IMPLANT
NDL SPNL 20GX3.5 QUINCKE YW (NEEDLE) IMPLANT
NEEDLE HYPO 22GX1.5 SAFETY (NEEDLE) ×3 IMPLANT
NEEDLE HYPO 25X1 1.5 SAFETY (NEEDLE) ×6 IMPLANT
NEEDLE SPNL 20GX3.5 QUINCKE YW (NEEDLE) ×3 IMPLANT
PACK BASIN MINOR ARMC (MISCELLANEOUS) ×3 IMPLANT
PAD DRESSING TELFA 3X8 NADH (GAUZE/BANDAGES/DRESSINGS) ×2 IMPLANT
PENCIL ELECTRO HAND CTR (MISCELLANEOUS) ×3 IMPLANT
RETRACTOR RING XSMALL (MISCELLANEOUS) IMPLANT
RTRCTR WOUND ALEXIS 13CM XS SH (MISCELLANEOUS) ×3
STRIP CLOSURE SKIN 1/2X4 (GAUZE/BANDAGES/DRESSINGS) ×3 IMPLANT
SUT ETHILON 3-0 FS-10 30 BLK (SUTURE) ×6
SUT SILK 2 0 (SUTURE)
SUT SILK 2-0 18XBRD TIE 12 (SUTURE) ×2 IMPLANT
SUT VIC AB 2-0 CT1 27 (SUTURE) ×9
SUT VIC AB 2-0 CT1 TAPERPNT 27 (SUTURE) ×4 IMPLANT
SUT VIC AB 3-0 54X BRD REEL (SUTURE) ×2 IMPLANT
SUT VIC AB 3-0 BRD 54 (SUTURE)
SUT VIC AB 3-0 SH 27 (SUTURE) ×3
SUT VIC AB 3-0 SH 27X BRD (SUTURE) ×4 IMPLANT
SUT VIC AB 4-0 FS2 27 (SUTURE) ×6 IMPLANT
SUTURE EHLN 3-0 FS-10 30 BLK (SUTURE) ×2 IMPLANT
SWABSTK COMLB BENZOIN TINCTURE (MISCELLANEOUS) ×3 IMPLANT
SYR 10ML LL (SYRINGE) ×4 IMPLANT
SYR BULB IRRIG 60ML STRL (SYRINGE) ×3 IMPLANT
TAPE TRANSPORE STRL 2 31045 (GAUZE/BANDAGES/DRESSINGS) ×3 IMPLANT
TRAP NEPTUNE SPECIMEN COLLECT (MISCELLANEOUS) ×3 IMPLANT
WATER STERILE IRR 500ML POUR (IV SOLUTION) ×3 IMPLANT

## 2021-12-24 ENCOUNTER — Encounter: Payer: Self-pay | Admitting: General Surgery

## 2021-12-25 ENCOUNTER — Other Ambulatory Visit: Payer: Medicare Other

## 2021-12-25 ENCOUNTER — Encounter: Payer: Self-pay | Admitting: *Deleted

## 2021-12-25 ENCOUNTER — Other Ambulatory Visit: Payer: Self-pay | Admitting: Anatomic Pathology & Clinical Pathology

## 2021-12-25 LAB — SURGICAL PATHOLOGY

## 2021-12-30 ENCOUNTER — Other Ambulatory Visit: Payer: Self-pay | Admitting: Family Medicine

## 2021-12-30 DIAGNOSIS — I10 Essential (primary) hypertension: Secondary | ICD-10-CM

## 2022-01-06 ENCOUNTER — Inpatient Hospital Stay: Payer: Medicare Other | Attending: Oncology

## 2022-01-06 ENCOUNTER — Telehealth: Payer: Self-pay | Admitting: Licensed Clinical Social Worker

## 2022-01-06 DIAGNOSIS — Z170 Estrogen receptor positive status [ER+]: Secondary | ICD-10-CM | POA: Insufficient documentation

## 2022-01-06 DIAGNOSIS — C50812 Malignant neoplasm of overlapping sites of left female breast: Secondary | ICD-10-CM | POA: Insufficient documentation

## 2022-01-08 ENCOUNTER — Ambulatory Visit: Payer: Self-pay | Admitting: Licensed Clinical Social Worker

## 2022-01-08 ENCOUNTER — Encounter: Payer: Self-pay | Admitting: *Deleted

## 2022-01-08 ENCOUNTER — Ambulatory Visit
Admission: RE | Admit: 2022-01-08 | Discharge: 2022-01-08 | Disposition: A | Discharge status: Home / Self Care | Payer: Medicare Other | Source: Ambulatory Visit | Attending: Radiation Oncology | Admitting: Radiation Oncology

## 2022-01-08 ENCOUNTER — Encounter: Payer: Self-pay | Admitting: Oncology

## 2022-01-08 ENCOUNTER — Inpatient Hospital Stay (HOSPITAL_BASED_OUTPATIENT_CLINIC_OR_DEPARTMENT_OTHER): Payer: Medicare Other | Admitting: Oncology

## 2022-01-08 ENCOUNTER — Inpatient Hospital Stay: Payer: Medicare Other | Admitting: Occupational Therapy

## 2022-01-08 ENCOUNTER — Encounter: Payer: Self-pay | Admitting: Licensed Clinical Social Worker

## 2022-01-08 DIAGNOSIS — Z1379 Encounter for other screening for genetic and chromosomal anomalies: Secondary | ICD-10-CM | POA: Insufficient documentation

## 2022-01-08 DIAGNOSIS — Z170 Estrogen receptor positive status [ER+]: Secondary | ICD-10-CM

## 2022-01-08 DIAGNOSIS — C50412 Malignant neoplasm of upper-outer quadrant of left female breast: Secondary | ICD-10-CM

## 2022-01-08 DIAGNOSIS — C50812 Malignant neoplasm of overlapping sites of left female breast: Secondary | ICD-10-CM | POA: Diagnosis not present

## 2022-01-08 DIAGNOSIS — Z7189 Other specified counseling: Secondary | ICD-10-CM | POA: Diagnosis not present

## 2022-01-08 DIAGNOSIS — L905 Scar conditions and fibrosis of skin: Secondary | ICD-10-CM

## 2022-01-08 MED ORDER — LETROZOLE 2.5 MG PO TABS: 2.5000 mg | ORAL_TABLET | Freq: Every day | ORAL | 2 refills | Status: DC

## 2022-01-08 NOTE — Consult Note (Signed)
NEW PATIENT EVALUATION  Name: Kathy Farmer  MRN: [id number]  Date:   01/08/2022     DOB: December 27, 1955   This 66 y.o. female patient presents to the clinic for initial evaluation of stage Ia (T1b N0 M0) ER/PR positive HER2 negative invasive mammary carcinoma of the left breast status post wide local excision and sentinel node biopsy.  REFERRING PHYSICIAN: Jerrol Banana.,*  CHIEF COMPLAINT: No chief complaint on file.   DIAGNOSIS: There were no encounter diagnoses.   PREVIOUS INVESTIGATIONS:  Mammogram and ultrasound reviewed Clinical notes reviewed Pathology reports reviewed  HPI: Patient is a 66 year old female who presents with an abnormal mammogram of her left breast.  There was distortion at the 3 o'clock position 9 cm from the nipple of the left breast showing a 7 x 2 x 3 mm lesion.  She underwent biopsy which was positive for invasive mammary carcinoma.  Ultrasound of the left axilla was negative.  She underwent a wide local excision and sentinel node biopsy for a 8 mm invasive mammary carcinoma ER/PR positive HER2 negative.  Margins were clear at least 5 mm.  3 sentinel lymph nodes were examined all negative for metastatic disease.  She has done well postoperatively although still having some pain in her left breast which seems to increased over the past week.  She specifically denies cough or bone pain.  PLANNED TREATMENT REGIMEN: Left whole breast radiation  PAST MEDICAL HISTORY:  has a past medical history of Anemia, Breast cancer ([place]), Depression, Dysrhythmia, GERD (gastroesophageal reflux disease), History of PCOS, Hypertension, Pre-diabetes, and Sleep apnea.    PAST SURGICAL HISTORY:  Past Surgical History:  Procedure Laterality Date   ABDOMINAL HYSTERECTOMY     AXILLARY SENTINEL NODE BIOPSY Left 12/23/2021   Procedure: AXILLARY SENTINEL NODE BIOPSY;  Surgeon: Robert Bellow, MD;  Location: [facility] ORS;  Service: General;  Laterality: Left;   BREAST  BIOPSY Left 11/18/2021   lt br stereo, distortion, x clip, [facility]   BREAST LUMPECTOMY Left 12/23/2021   [facility]   BREAST LUMPECTOMY WITH NEEDLE LOCALIZATION Left 12/23/2021   Procedure: BREAST LUMPECTOMY WITH NEEDLE LOCALIZATION;  Surgeon: Robert Bellow, MD;  Location: [facility] ORS;  Service: General;  Laterality: Left;   CESAREAN SECTION     DIAGNOSTIC LAPAROSCOPY     DILATION AND CURETTAGE OF UTERUS  06/30/2009   with hysteroecopy   TUBAL LIGATION Bilateral     FAMILY HISTORY: family history includes Breast cancer (age of onset: 60) in her mother; Breast cancer (age of onset: 33) in her maternal aunt; Cancer in her brother; Cancer - Other in her paternal grandmother; Diabetes in her father; Hypertension in her father; Lung cancer in her maternal grandfather; Osteoporosis in her mother; Pancreatic cancer in her maternal uncle and paternal grandmother.  SOCIAL HISTORY:  reports that she has never smoked. She has never used smokeless tobacco. She reports current alcohol use. She reports that she does not use drugs.  ALLERGIES: Patient has no active allergies.  MEDICATIONS:  Current Outpatient Medications  Medication Sig Dispense Refill   Cholecalciferol (VITAMIN D-3) 25 MCG (1000 UT) CAPS Take 1,000 Units by mouth daily.     cyanocobalamin 1000 MCG tablet Take 1,000 mcg by mouth daily.     letrozole (FEMARA) 2.5 MG tablet Take 1 tablet (2.5 mg total) by mouth daily. 30 tablet 2   losartan-hydrochlorothiazide (HYZAAR) 50-12.5 MG tablet TAKE ONE TABLET BY MOUTH DAILY 90 tablet 3   No current facility-administered medications for this  encounter.    ECOG PERFORMANCE STATUS:  0 - Asymptomatic  REVIEW OF SYSTEMS: Patient denies any weight loss, fatigue, weakness, fever, chills or night sweats. Patient denies any loss of vision, blurred vision. Patient denies any ringing  of the ears or hearing loss. No irregular heartbeat. Patient denies heart murmur or history of fainting. Patient denies any  chest pain or pain radiating to her upper extremities. Patient denies any shortness of breath, difficulty breathing at night, cough or hemoptysis. Patient denies any swelling in the lower legs. Patient denies any nausea vomiting, vomiting of blood, or coffee ground material in the vomitus. Patient denies any stomach pain. Patient states has had normal bowel movements no significant constipation or diarrhea. Patient denies any dysuria, hematuria or significant nocturia. Patient denies any problems walking, swelling in the joints or loss of balance. Patient denies any skin changes, loss of hair or loss of weight. Patient denies any excessive worrying or anxiety or significant depression. Patient denies any problems with insomnia. Patient denies excessive thirst, polyuria, polydipsia. Patient denies any swollen glands, patient denies easy bruising or easy bleeding. Patient denies any recent infections, allergies or URI. Patient "s visual fields have not changed significantly in recent time.   PHYSICAL EXAM: LMP 01/24/2011  Left breast has some fluid accumulation in the lumpectomy site otherwise no dominant masses noted in either breast.  No axillary or supraclavicular adenopathy identified incisions are healing well.  Well-developed well-nourished patient in NAD. HEENT reveals PERLA, EOMI, discs not visualized.  Oral cavity is clear. No oral mucosal lesions are identified. Neck is clear without evidence of cervical or supraclavicular adenopathy. Lungs are clear to A&P. Cardiac examination is essentially unremarkable with regular rate and rhythm without murmur rub or thrill. Abdomen is benign with no organomegaly or masses noted. Motor sensory and DTR levels are equal and symmetric in the upper and lower extremities. Cranial nerves II through XII are grossly intact. Proprioception is intact. No peripheral adenopathy or edema is identified. No motor or sensory levels are noted. Crude visual fields are within normal  range.  LABORATORY DATA: Pathology reports reviewed    RADIOLOGY RESULTS: Mammogram and ultrasound reviewed compatible with above-stated findings   IMPRESSION: Stage Ia ER/PR positive invasive mammary carcinoma the left breast status post wide local excision and sentinel node biopsy in 66 year old female  PLAN: Patient has been evaluated by medical oncology will not receive systemic therapy.  Her breasts are large and would necessitate a nonhypofractionated course of treatment we will plan on delivering 50.4 Pearline Cables to her left breast.  I would also boost her scar another 1000 cGy using electron beam.  Risks and benefits of treatment including skin reaction fatigue alteration blood counts possible inclusion of superficial lung all were reviewed in detail with the patient.  She seems to comprehend my treatment plan well.  Patient also benefit from endocrine therapy after completion of radiation.  I will allow another week or so to allow some of the tenderness in her breast to subside.  I have personally set up and ordered CT simulation.  I would like to take this opportunity to thank you for allowing me to participate in the care of your patient..  Risks and benefits of treatment  Noreene Filbert, MD

## 2022-01-08 NOTE — Progress Notes (Signed)
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  This was followed by diagnostic mammogram and ultrasound which showed architectural distortion in the lateral left breast at the 3 o'clock position.  There were a group of cysts measuring 7 x 2 x 3 mm.  Stereotactic biopsy of the architectural distortion was recommended.  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    Final pathology showed 8 mm grade 1 tumor with negative margins. 5 Sentinel lymph nodes negative for malignancy.  Genetic testing negative.  Interval history-patient is doing well post lumpectomy.  She still has some soreness in the area of the left breastBut otherwise denies other complaints.  She was also seen by Luna Fuse for lymphedema prevention.  ECOG PS- 0 Pain scale- 0   Review of systems- Review of Systems  Constitutional:  Negative for chills, fever, malaise/fatigue and weight loss.  HENT:  Negative for congestion, ear discharge and nosebleeds.   Eyes:  Negative for blurred vision.  Respiratory:  Negative for cough, hemoptysis, sputum production, shortness of breath and wheezing.   Cardiovascular:  Negative for chest pain, palpitations, orthopnea and claudication.  Gastrointestinal:  Negative for  abdominal pain, blood in stool, constipation, diarrhea, heartburn, melena, nausea and vomiting.  Genitourinary:  Negative for dysuria, flank pain, frequency, hematuria and urgency.  Musculoskeletal:  Negative for back pain, joint pain and myalgias.  Skin:  Negative for rash.  Neurological:  Negative for dizziness, tingling, focal weakness, seizures, weakness and headaches.  Endo/Heme/Allergies:  Does not bruise/bleed easily.  Psychiatric/Behavioral:  Negative for depression and suicidal ideas. The patient does not have insomnia.       No Active Allergies   Past Medical History:  Diagnosis Date   Anemia    Breast cancer ([place])    Depression    Dysrhythmia    racing heart- neg workup   GERD (gastroesophageal reflux disease)    History of PCOS    Hypertension    Pre-diabetes    Sleep apnea    inconclusive sleep study     Past Surgical History:  Procedure Laterality Date   ABDOMINAL HYSTERECTOMY     AXILLARY SENTINEL NODE BIOPSY Left 12/23/2021   Procedure: AXILLARY SENTINEL NODE BIOPSY;  Surgeon: Robert Bellow, MD;  Location: [facility] ORS;  Service: General;  Laterality: Left;   BREAST BIOPSY Left 11/18/2021   lt br stereo, distortion, x clip, [facility]   BREAST LUMPECTOMY Left 12/23/2021   [facility]   BREAST LUMPECTOMY WITH NEEDLE LOCALIZATION Left 12/23/2021   Procedure: BREAST LUMPECTOMY WITH NEEDLE LOCALIZATION;  Surgeon: Robert Bellow, MD;  Location: [facility] ORS;  Service: General;  Laterality: Left;   CESAREAN SECTION     DIAGNOSTIC LAPAROSCOPY     DILATION AND CURETTAGE OF UTERUS  06/30/2009   with hysteroecopy   TUBAL LIGATION Bilateral  Social History   Socioeconomic History   Marital status: Married    Spouse name: Not on file   Number of children: 2   Years of education: Not on file   Highest education level: Not on file  Occupational History    Employer: [employer]  Tobacco Use   Smoking status: Never   Smokeless tobacco: Never  Vaping Use    Vaping Use: Never used  Substance and Sexual Activity   Alcohol use: Yes    Alcohol/week: 0.0 standard drinks of alcohol    Comment: on occasion    Drug use: No   Sexual activity: Yes    Partners: Male  Other Topics Concern   Not on file  Social History Narrative   Not on file   Social Determinants of Health   Financial Resource Strain: Not on file  Food Insecurity: Not on file  Transportation Needs: Not on file  Physical Activity: Not on file  Stress: Not on file  Social Connections: Not on file  Intimate Partner Violence: Not on file    Family History  Problem Relation Age of Onset   Breast cancer Mother 21   Osteoporosis Mother    Diabetes Father    Hypertension Father    Cancer Brother        cholangiocarcinoma   Breast cancer Maternal Aunt 78   Pancreatic cancer Maternal Uncle    Lung cancer Maternal Grandfather    Pancreatic cancer Paternal Grandmother    Cancer - Other Paternal Grandmother        cholangiocarcinoma     Current Outpatient Medications:    Cholecalciferol (VITAMIN D-3) 25 MCG (1000 UT) CAPS, Take 1,000 Units by mouth daily., Disp: , Rfl:    cyanocobalamin 1000 MCG tablet, Take 1,000 mcg by mouth daily., Disp: , Rfl:    letrozole (FEMARA) 2.5 MG tablet, Take 1 tablet (2.5 mg total) by mouth daily., Disp: 30 tablet, Rfl: 2   losartan-hydrochlorothiazide (HYZAAR) 50-12.5 MG tablet, TAKE ONE TABLET BY MOUTH DAILY, Disp: 90 tablet, Rfl: 3  Physical exam:  Physical Exam Constitutional:      General: She is not in acute distress. Cardiovascular:     Rate and Rhythm: Normal rate and regular rhythm.     Heart sounds: Normal heart sounds.  Pulmonary:     Effort: Pulmonary effort is normal.  Skin:    General: Skin is warm and dry.  Neurological:     Mental Status: She is alert and oriented to person, place, and time.         Latest Ref Rng & Units 09/25/2021    9:13 AM  CMP  Glucose 70 - 99 mg/dL 100   BUN 8 - 27 mg/dL 15   Creatinine 0.57  - 1.00 mg/dL 1.42   Sodium 134 - 144 mmol/L 144   Potassium 3.5 - 5.2 mmol/L 4.0   Chloride 96 - 106 mmol/L 106   CO2 20 - 29 mmol/L 24   Calcium 8.7 - 10.3 mg/dL 9.5   Total Protein 6.0 - 8.5 g/dL 6.5   Total Bilirubin 0.0 - 1.2 mg/dL 0.5   Alkaline Phos 44 - 121 IU/L 73   AST 0 - 40 IU/L 23   ALT 0 - 32 IU/L 20       Latest Ref Rng & Units 11/15/2020   11:22 AM  CBC  WBC 3.4 - 10.8 x10E3/uL 6.2   Hemoglobin 11.1 - 15.9 g/dL 13.1   Hematocrit 34.0 -  46.6 % 38.5   Platelets 150 - 450 x10E3/uL 253     No images are attached to the encounter.  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 EXAM: SPECIMEN RADIOGRAPH OF THE LEFT BREAST COMPARISON:  Previous examinations. FINDINGS: Status post excision of the left breast. The wire tip and X shaped biopsy marker clip are present and intact. IMPRESSION: Specimen radiograph of the left breast. Electronically Signed   By: Claudie Revering M.D.   On: 12/23/2021 12:35  NM Sentinel Node Inj-No Rpt (Breast)  Result Date: 12/23/2021 Sulfur Colloid was injected by the Nuclear Medicine Technologist for sentinel lymph node localization.   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 PROCEDURE: Patient presents for needle localization prior to left lumpectomy. I met with the patient and we discussed the procedure of needle localization including benefits and alternatives. We discussed the high likelihood of a successful procedure. We discussed the risks of the procedure, including infection, bleeding, tissue injury, and further surgery. Informed,  written consent was given. The usual time-out protocol was performed immediately prior to the procedure. Using mammographic guidance, sterile technique, 1% lidocaine and a 7 cm modified Kopans needle, the recently placed exit biopsy marker clip in the posterior outer left breast was localized using lateral approach. The images were marked for Dr. Bary Castilla. IMPRESSION: Needle localization left breast. No apparent complications. Electronically Signed   By: Claudie Revering M.D.   On: 12/23/2021 08:43    Assessment and plan- Patient is a 66 y.o. female with pathological prognostic stage Ia invasive mammary carcinoma of the right breast pT1b pN0 cM0 ER/PR positive HER2 negative here to discuss further management  Since the final pathology showed that the size of the tumor was less than 1 cm and grade 1 histology patient does not require Oncotype testing for adjuvant chemotherapy.  Patient does not require any adjuvant chemotherapy at this time.  She can proceed with adjuvant radiation.  Given that her tumor is ER positive hormone therapy would be indicated.  Discussed risks and benefits of both tamoxifen and aromatase inhibitor.  Given recurrence risk benefit associated with aromatase inhibitors I would like her to start letrozole after she completes radiation.  Discussed risks and benefits of letrozole including all but not limited to mood swings, hot flashes, arthralgias and worsening bone health.  I will send a prescription for letrozole to her pharmacy which she will start after radiation is completed.  She is scheduled for a bone density scan next month.  Treatment will be given with a curative intent.  Patient understands and agrees to proceed as planned   Cancer Staging  Malignant neoplasm of upper-outer quadrant of left breast in female, estrogen receptor positive ([place]) Staging form: Breast, AJCC 8th Edition - Clinical stage from 11/27/2021: Stage IA (cT1b, cN0, cM0, G1, ER+, PR+, HER2-) - Signed by Sindy Guadeloupe, MD on 11/27/2021 Histologic grading system: 3 grade system - Pathologic stage from 01/08/2022: Stage IA (pT1b, pN0, cM0, G1, ER+, PR+, HER2-) - Signed by Sindy Guadeloupe, MD on 01/08/2022 Stage prefix: Initial diagnosis Multigene prognostic tests performed:  None Histologic grading system: 3 grade system     Visit Diagnosis 1. Malignant neoplasm of upper-outer quadrant of left breast in female, estrogen receptor positive ([place])   2. Goals of care, counseling/discussion      Dr. Randa Evens, MD, MPH [facility] [phone] 01/08/2022 7:03 PM

## 2022-01-08 NOTE — Telephone Encounter (Signed)
Revealed negative genetic testing.  Revealed that a VUS in MET was identified. This normal result is reassuring and indicates that it is unlikely Kathy Farmer's cancer is due to a hereditary cause.  It is unlikely that there is an increased risk of another cancer due to a mutation in one of these genes.  However, genetic testing is not perfect, and cannot definitively rule out a hereditary cause.  It will be important for her to keep in contact with genetics to learn if any additional testing may be needed in the future.

## 2022-01-08 NOTE — Therapy (Signed)
[facility] at [facility] [place], [place], [place], [address] Phone: [phone]   Fax:  [phone]  Occupational Therapy Screen  Patient Details  Name: Kathy Farmer MRN: [id number] Date of Birth: [birth year] No data recorded  Encounter Date: 01/08/2022   OT End of Session - 01/08/22 1842     Visit Number 0             Past Medical History:  Diagnosis Date   Anemia    Breast cancer ([place])    Depression    Dysrhythmia    racing heart- neg workup   GERD (gastroesophageal reflux disease)    History of PCOS    Hypertension    Pre-diabetes    Sleep apnea    inconclusive sleep study    Past Surgical History:  Procedure Laterality Date   ABDOMINAL HYSTERECTOMY     AXILLARY SENTINEL NODE BIOPSY Left 12/23/2021   Procedure: AXILLARY SENTINEL NODE BIOPSY;  Surgeon: Robert Bellow, MD;  Location: [facility] ORS;  Service: General;  Laterality: Left;   BREAST BIOPSY Left 11/18/2021   lt br stereo, distortion, x clip, [facility]   BREAST LUMPECTOMY Left 12/23/2021   [facility]   BREAST LUMPECTOMY WITH NEEDLE LOCALIZATION Left 12/23/2021   Procedure: BREAST LUMPECTOMY WITH NEEDLE LOCALIZATION;  Surgeon: Robert Bellow, MD;  Location: [facility] ORS;  Service: General;  Laterality: Left;   CESAREAN SECTION     DIAGNOSTIC LAPAROSCOPY     DILATION AND CURETTAGE OF UTERUS  06/30/2009   with hysteroecopy   TUBAL LIGATION Bilateral     There were no vitals filed for this visit.   Subjective Assessment - 01/08/22 1840     Subjective  I am doing okay but my left breast just feels heavier and for.  Larger.  Worse by the end of the day.  I have some hard areas and tenderness can be up to 9/10 like yesterday.  This morning is about a 5/10.  Motion is doing okay.  I am meeting with Dr. Donella Stade about radiation later today.    Currently in Pain? Yes    Pain Score 5     Pain Location Breast    Pain Orientation Left    Pain Descriptors /  Indicators Tender;Tightness;Heaviness    Pain Type Surgical pain    Pain Onset 1 to 4 weeks ago                 LYMPHEDEMA/ONCOLOGY QUESTIONNAIRE - 01/08/22 0001       Right Upper Extremity Lymphedema   10 cm Proximal to Olecranon Process 41 cm    Olecranon Process 30 cm    15 cm Proximal to Ulnar Styloid Process 27 cm    Just Proximal to Ulnar Styloid Process 17.4 cm      Left Upper Extremity Lymphedema   10 cm Proximal to Olecranon Process 40 cm    Olecranon Process 30.5 cm    15 cm Proximal to Ulnar Styloid Process 26.5 cm    Just Proximal to Ulnar Styloid Process 17.5 cm             NOTE Dr Bary Castilla 01/02/22: Excellent breast volume preservation. Incisions healing well. Mild fullness in the left axilla. Ultrasound negative for seroma.  Excellent shoulder range of motion. Neurological:  Mental Status: She is alert and oriented to person, place, and time.  Psychiatric:  Mood and Affect: Mood normal.  Behavior: Behavior normal.    Assessment:  Doing well post wide excision and sentinel node biopsy.  Plan:   Patient may increase her upper extremity activity as she desires. Care with repetitive activity discussed.  We will plan for follow-up examination in 3 weeks.  Patient is scheduled to meet with radiation oncology and follow-up with medical oncology on January 08, 2022.   OT SCREEN 01/08/22: Patient present at OT visit postop left lumpectomy on 12/23/2021.  0 out of 5 lymph nodes negative.  Patient has a visit with Dr. Donella Stade and Dr. Janese Banks today about treatment and when to start radiation. Patient worried about increased swelling in the left breast.  Tenderness over the lateral scar as well as around the nipple.  During the day 5/10 but can increase by the end of the day to a 9/10. Patient work in an office job.  Like to knit, read and try and play some golf. Patient has area of tenderness and fibrosis around lateral scar and around lateral level.  Patient was  educated on scar massage and soft tissue moves with great success.  As well as this time also recommend a p unilateral postmastectomy Jovi pack to wear as much as she can the next week or 2 prior to starting radiation to decrease swelling and fibrosis in the left breast. Information was provided where to get it.  As well as wearing of Jovi pack. Patient's active range of motion is within functional limits but decreased at endrange shoulder flexion and abduction on the left. Reviewed with patient home exercises for active assisted range of motion against wall or in supine for shoulder flexion, abduction and external rotation to be done daily even during her radiation to maintain motion. Circumference of bilateral upper extremity was taken no signs and symptoms of lymphedema.  Reviewed with patient signs and symptoms as well as prevention for lymphedema and handout of the cancer journal was provided.  Patient is low risk for lymphedema. Recommended to follow-up with me halfway through radiation as well as end of radiation.                                   Visit Diagnosis: Scar condition and fibrosis of skin    Problem List Patient Active Problem List   Diagnosis Date Noted   Genetic testing 01/08/2022   Malignant neoplasm of upper-outer quadrant of left breast in female, estrogen receptor positive ([place]) 11/27/2021   Abnormal mammogram 11/07/2021   Motion sickness 11/07/2021   LUQ abdominal pain 09/12/2021   Paresthesia of right foot 08/13/2021   B12 deficiency 11/20/2016   Clinical depression 07/12/2015   Diabetes ([place]) 07/12/2015   Elevation of level of transaminase or lactic acid dehydrogenase (LDH) 07/12/2015   GERD (gastroesophageal reflux disease) 07/12/2015   Benign essential HTN 07/12/2015   Adiposity 07/12/2015   Apnea, sleep 07/12/2015   Tension type headache 07/12/2015   Thoracic outlet syndrome 07/12/2015   Raynaud's phenomenon 12/15/2014   Leg  cramps, sleep related 12/15/2014   Infraspinatus tenosynovitis 07/27/2014   Impingement syndrome of shoulder 07/27/2014   Closed fracture of glenoid cavity of scapula 07/27/2014   Shoulder subluxation 07/27/2014   Posterior tibial tendinitis 07/03/2014   Weight gain 03/16/2014   Bilateral polycystic ovarian syndrome 03/16/2014    Rosalyn Gess, OTR/L,CLT 01/08/2022, 6:43 PM  [place] at [facility] [address], [place], [place], [address] Phone: [phone]   Fax:  [phone]  Name: Kathy Farmer MRN: [id number] Date of Birth: 1955/07/18

## 2022-01-08 NOTE — Progress Notes (Signed)
HPI:  Kathy Farmer was previously seen in the [facility] clinic due to a personal and family history of cancer and concerns regarding a hereditary predisposition to cancer. Please refer to our prior cancer genetics clinic note for more information regarding our discussion, assessment and recommendations, at the time. Kathy Farmer recent genetic test results were disclosed to her, as were recommendations warranted by these results. These results and recommendations are discussed in more detail below.  CANCER HISTORY:  Oncology History  Malignant neoplasm of upper-outer quadrant of left breast in female, estrogen receptor positive ([place])  11/27/2021 Initial Diagnosis   Malignant neoplasm of upper-outer quadrant of left breast in female, estrogen receptor positive ([place])   11/27/2021 Cancer Staging   Staging form: Breast, AJCC 8th Edition - Clinical stage from 11/27/2021: Stage IA (cT1b, cN0, cM0, G1, ER+, PR+, HER2-) - Signed by Sindy Guadeloupe, MD on 11/27/2021 Histologic grading system: 3 grade system    Genetic Testing   Negative genetic testing. No pathogenic variants identified on the [facility] Multi-Cancer+RNA panel. VUS in MET called [id number] identified. The report date is 01/02/2022.  The CancerNext-Expanded + RNAinsight gene panel offered by [employer] and includes sequencing and rearrangement analysis for the following 77 genes: IP, ALK, APC*, ATM*, AXIN2, BAP1, BARD1, BLM, BMPR1A, BRCA1*, BRCA2*, BRIP1*, CDC73, CDH1*,CDK4, CDKN1B, CDKN2A, CHEK2*, CTNNA1, DICER1, FANCC, FH, FLCN, GALNT12, KIF1B, LZTR1, MAX, MEN1, MET, MLH1*, MSH2*, MSH3, MSH6*, MUTYH*, NBN, NF1*, NF2, NTHL1, PALB2*, PHOX2B, PMS2*, POT1, PRKAR1A, PTCH1, PTEN*, RAD51C*, RAD51D*,RB1, RECQL, RET, SDHA, SDHAF2, SDHB, SDHC, SDHD, SMAD4, SMARCA4, SMARCB1, SMARCE1, STK11, SUFU, TMEM127, TP53*,TSC1, TSC2, VHL and XRCC2 (sequencing and deletion/duplication); EGFR, EGLN1, HOXB13, KIT, MITF, PDGFRA, POLD1 and POLE (sequencing  only); EPCAM and GREM1 (deletion/duplication only).      FAMILY HISTORY:  We obtained a detailed, 4-generation family history.  Significant diagnoses are listed below: Family History  Problem Relation Age of Onset   Breast cancer Mother 80   Osteoporosis Mother    Diabetes Father    Hypertension Father    Cancer Brother        cholangiocarcinoma   Breast cancer Maternal Aunt 78   Pancreatic cancer Maternal Uncle    Lung cancer Maternal Grandfather    Pancreatic cancer Paternal Grandmother    Cancer - Other Paternal Grandmother        cholangiocarcinoma   Kathy Farmer has 2 sons, 46 and 26. Her younger son has a diagnosis of ankylosing spondylitis and tested positive for a gene associated with this. Patient has 2 brothers. One brother had cholangiocarcinoma, history of alcohol use, and passed at 65.   Kathy Farmer mother had breast cancer at 28 and passed at 36. Patient had 3 maternal aunts, 1 uncle. Her uncle had pancreatic cancer and passed at 17, he also had exposure to agent orange in [place]. An aunt had breast cancer at 74 and passed at 30. Maternal grandmother passed at 44. Grandfather had lung cancer and passed at 13, history of smoking.    Kathy Farmer father died at 62 of an aortic aneurysm. He was an only child. Paternal grandmother had cholangiocarcinoma and pancreatic cancer and passed at 75. Grandfather also passed of aortic aneurysm.   Kathy Farmer is unaware of previous family history of genetic testing for hereditary cancer risks. There is no reported Ashkenazi Jewish ancestry. There is no known consanguinity.     GENETIC TEST RESULTS: Genetic testing reported out on 01/02/2022 through the [employer] CancerNext-Expanded+RNA cancer panel found no pathogenic  mutations.   The CancerNext-Expanded + RNAinsight gene panel offered by [employer] and includes sequencing and rearrangement analysis for the following 77 genes: IP, ALK, APC*, ATM*, AXIN2, BAP1, BARD1, BLM, BMPR1A,  BRCA1*, BRCA2*, BRIP1*, CDC73, CDH1*,CDK4, CDKN1B, CDKN2A, CHEK2*, CTNNA1, DICER1, FANCC, FH, FLCN, GALNT12, KIF1B, LZTR1, MAX, MEN1, MET, MLH1*, MSH2*, MSH3, MSH6*, MUTYH*, NBN, NF1*, NF2, NTHL1, PALB2*, PHOX2B, PMS2*, POT1, PRKAR1A, PTCH1, PTEN*, RAD51C*, RAD51D*,RB1, RECQL, RET, SDHA, SDHAF2, SDHB, SDHC, SDHD, SMAD4, SMARCA4, SMARCB1, SMARCE1, STK11, SUFU, TMEM127, TP53*,TSC1, TSC2, VHL and XRCC2 (sequencing and deletion/duplication); EGFR, EGLN1, HOXB13, KIT, MITF, PDGFRA, POLD1 and POLE (sequencing only); EPCAM and GREM1 (deletion/duplication only).   The test report has been scanned into EPIC and is located under the Molecular Pathology section of the Results Review tab.  A portion of the result report is included below for reference.     We discussed that because current genetic testing is not perfect, it is possible there may be a gene mutation in one of these genes that current testing cannot detect, but that chance is small.  There could be another gene that has not yet been discovered, or that we have not yet tested, that is responsible for the cancer diagnoses in the family. It is also possible there is a hereditary cause for the cancer in the family that Kathy Farmer did not inherit and therefore was not identified in her testing.  Therefore, it is important to remain in touch with cancer genetics in the future so that we can continue to offer Kathy Farmer the most up to date genetic testing.   Genetic testing did identify a variant of uncertain significance (VUS) in the MET gene.  At this time, it is unknown if this variant is associated with increased cancer risk or if this is a normal finding, but most variants such as this get reclassified to being inconsequential. It should not be used to make medical management decisions. With time, we suspect the lab will determine the significance of this variant, if any. If we do learn more about it we will try to contact Kathy Farmer to discuss it further.  However, it is important to stay in touch with [place] periodically and keep the address and phone number up to date.  ADDITIONAL GENETIC TESTING: We discussed with Kathy Farmer that her genetic testing was fairly extensive.  If there are genes identified to increase cancer risk that can be analyzed in the future, we would be happy to discuss and coordinate this testing at that time.    CANCER SCREENING RECOMMENDATIONS: Kathy Farmer test result is considered negative (normal).  This means that we have not identified a hereditary cause for her  personal and family history of cancer at this time. Most cancers happen by chance and this negative test suggests that her cancer may fall into this category.    While reassuring, this does not definitively rule out a hereditary predisposition to cancer. It is still possible that there could be genetic mutations that are undetectable by current technology. There could be genetic mutations in genes that have not been tested or identified to increase cancer risk.  Therefore, it is recommended she continue to follow the cancer management and screening guidelines provided by her oncology and primary healthcare provider.   An individual's cancer risk and medical management are not determined by genetic test results alone. Overall cancer risk assessment incorporates additional factors, including personal medical history, family history, and any available genetic information that may result in a  personalized plan for cancer prevention and surveillance.  RECOMMENDATIONS FOR FAMILY MEMBERS:  Relatives in this family might be at some increased risk of developing cancer, over the general population risk, simply due to the family history of cancer.  We recommended female relatives in this family have a yearly mammogram beginning at age 41, or 73 years younger than the earliest onset of cancer, an annual clinical breast exam, and perform monthly breast self-exams. Female relatives in  this family should also have a gynecological exam as recommended by their primary provider.  All family members should be referred for colonoscopy starting at age 76.   It is also possible there is a hereditary cause for the cancer in Ms. Stalzer's family that she did not inherit and therefore was not identified in her.  Based on Ms. Shubert's family history, we recommended maternal relatives have genetic counseling and testing. Ms. Templeman will let us know if we can be of any assistance in coordinating genetic counseling and/or testing for these family members.  FOLLOW-UP: Lastly, we discussed with Kathy Farmer that cancer genetics is a rapidly advancing field and it is possible that new genetic tests will be appropriate for her and/or her family members in the future. We encouraged her to remain in contact with cancer genetics on an annual basis so we can update her personal and family histories and let her know of advances in cancer genetics that may benefit this family.   Our contact number was provided. Kathy Farmer questions were answered to her satisfaction, and she knows she is welcome to call us at anytime with additional questions or concerns.   Kathy Rogue, MS, [facility] Genetic Counselor [email or web address] Phone: [phone]

## 2022-01-15 ENCOUNTER — Ambulatory Visit: Payer: Medicare Other | Admitting: Physician Assistant

## 2022-01-17 ENCOUNTER — Encounter: Payer: Self-pay | Admitting: Physician Assistant

## 2022-01-17 ENCOUNTER — Ambulatory Visit (INDEPENDENT_AMBULATORY_CARE_PROVIDER_SITE_OTHER): Payer: Medicare Other | Admitting: Physician Assistant

## 2022-01-17 VITALS — BP 102/70 | HR 93 | Ht 70.0 in | Wt 240.8 lb

## 2022-01-17 DIAGNOSIS — R7303 Prediabetes: Secondary | ICD-10-CM

## 2022-01-17 DIAGNOSIS — E559 Vitamin D deficiency, unspecified: Secondary | ICD-10-CM | POA: Diagnosis not present

## 2022-01-17 DIAGNOSIS — I129 Hypertensive chronic kidney disease with stage 1 through stage 4 chronic kidney disease, or unspecified chronic kidney disease: Secondary | ICD-10-CM

## 2022-01-17 DIAGNOSIS — E538 Deficiency of other specified B group vitamins: Secondary | ICD-10-CM

## 2022-01-17 DIAGNOSIS — N183 Chronic kidney disease, stage 3 unspecified: Secondary | ICD-10-CM

## 2022-01-17 NOTE — Progress Notes (Signed)
I,Sha'taria Tyson,acting as a Education administrator for [employer], PA-C.,have documented all relevant documentation on the behalf of [name], PA-C,as directed by  [name], PA-C while in the presence of [name], PA-C.   Established patient visit   Patient: Kathy Farmer   DOB: 1956/02/21   65 y.o. Female  MRN: [id number] Visit Date: 01/17/2022  Today's healthcare provider: [name], PA-C  Cc. Predm, htn f/u  Subjective    HPI  Prediabetes, Follow-up  Lab Results  Component Value Date   HGBA1C 6.0 (H) 08/13/2021   HGBA1C 5.7 (H) 11/15/2020   HGBA1C 5.7 (A) 01/24/2020   GLUCOSE 100 (H) 09/25/2021   GLUCOSE 96 08/13/2021   GLUCOSE 93 11/15/2020    Last seen for for this4 months ago.  Management since that visit includes restart metformin 500 mg. Current symptoms include none and have been unchanged.  Prior visit with dietician: no Current diet: low salt Current exercise: none  Pertinent Labs:    Component Value Date/Time   CHOL 181 11/15/2020 1122   TRIG 134 11/15/2020 1122   CHOLHDL 4.6 (H) 11/15/2020 1122   CREATININE 1.42 (H) 09/25/2021 0913    Wt Readings from Last 3 Encounters:  01/17/22 240 lb 12.8 oz (109.2 kg)  12/23/21 234 lb 12.6 oz (106.5 kg)  12/16/21 235 lb (106.6 kg)    -----------------------------------------------------------------------------------------  Hypertension, follow-up  BP Readings from Last 3 Encounters:  01/17/22 102/70  12/23/21 (!) 150/90  11/07/21 (!) 150/87   Wt Readings from Last 3 Encounters:  01/17/22 240 lb 12.8 oz (109.2 kg)  12/23/21 234 lb 12.6 oz (106.5 kg)  12/16/21 235 lb (106.6 kg)     She was last seen for hypertension 2 months ago.  BP at that visit was 150/87. Management since that visit includes continue current treatment.  She reports excellent compliance with treatment. She is not having side effects.  She is following a Low Sodium diet. She is not exercising. She  does not smoke.  Use of agents associated with hypertension: none.   Outside blood pressures are checked on occassions Symptoms: No chest pain No chest pressure  No palpitations No syncope  No dyspnea No orthopnea  No paroxysmal nocturnal dyspnea Yes lower extremity edema   Pertinent labs Lab Results  Component Value Date   CHOL 181 11/15/2020   HDL 39 (L) 11/15/2020   LDLCALC 118 (H) 11/15/2020   TRIG 134 11/15/2020   CHOLHDL 4.6 (H) 11/15/2020   Lab Results  Component Value Date   NA 144 09/25/2021   K 4.0 09/25/2021   CREATININE 1.42 (H) 09/25/2021   EGFR 41 (L) 09/25/2021   GLUCOSE 100 (H) 09/25/2021   TSH 1.480 11/15/2020     The 10-year ASCVD risk score (Arnett DK, et al., 2019) is: 9.9%  --------------------------------------------------------------------------------------------------- Follow up for weight-management  The patient was last seen for this 2 months ago. Changes made at last visit include restart metformin 500 mg.  She reports poor compliance with treatment. She feels that condition is Unchanged. She is not having side effects.   -----------------------------------------------------------------------------------------   Medications: Outpatient Medications Prior to Visit  Medication Sig   Cholecalciferol (VITAMIN D-3) 25 MCG (1000 UT) CAPS Take 1,000 Units by mouth daily.   cyanocobalamin 1000 MCG tablet Take 1,000 mcg by mouth daily.   losartan-hydrochlorothiazide (HYZAAR) 50-12.5 MG tablet TAKE ONE TABLET BY MOUTH DAILY   letrozole (FEMARA) 2.5 MG tablet Take 1 tablet (2.5 mg total) by mouth daily. (  Patient not taking: Reported on 01/17/2022)   No facility-administered medications prior to visit.    Review of Systems  Constitutional:  Negative for fatigue and fever.  Respiratory:  Negative for cough and shortness of breath.   Cardiovascular:  Negative for chest pain and leg swelling.  Gastrointestinal:  Negative for abdominal pain.   Neurological:  Negative for dizziness and headaches.       Objective    BP 102/70 Comment: at breast surgeon 01/14/22  Pulse 93   Ht '5\' 10"'  (1.778 m)   Wt 240 lb 12.8 oz (109.2 kg)   LMP 01/24/2011   SpO2 96%   BMI 34.55 kg/m  Blood pressure 102/70, pulse 93, height '5\' 10"'  (1.778 m), weight 240 lb 12.8 oz (109.2 kg), last menstrual period 01/24/2011, SpO2 96 %.   Physical Exam Constitutional:      General: She is awake.     Appearance: She is well-developed.  HENT:     Head: Normocephalic.  Eyes:     Conjunctiva/sclera: Conjunctivae normal.  Cardiovascular:     Rate and Rhythm: Normal rate and regular rhythm.     Heart sounds: Normal heart sounds.  Pulmonary:     Effort: Pulmonary effort is normal.     Breath sounds: Normal breath sounds.  Musculoskeletal:     Right lower leg: No edema.     Left lower leg: No edema.  Skin:    General: Skin is warm.  Neurological:     Mental Status: She is alert and oriented to person, place, and time.  Psychiatric:        Attention and Perception: Attention normal.        Mood and Affect: Mood normal.        Speech: Speech normal.        Behavior: Behavior is cooperative.      No results found for any visits on 01/17/22.  Assessment & Plan     Problem List Items Addressed This Visit       Cardiovascular and Mediastinum   Benign hypertension with CKD (chronic kidney disease) stage III (HCC) - Primary    Elevated in office, recent appt with breast surgeon was 102/70. On review of other practice visits-- always WNR. Continue w/ medication Advising pt to check at home.  Stable decreased kidney function ; will check urine micro and consider referral to nephrology.        Relevant Orders   Urine Microalbumin w/creat. ratio     Other   Prediabetes    Will check a1c. Again discussed wegovy -- her supplemental insurance may cover. Advised moa, side effects, how to use. Depending on a1c will send ozempic vs wegovy.  Failed  metformin trial Ref to nutritionist F/u 63mo      Relevant Orders   HgB A1c   Amb ref to Medical Nutrition Therapy-MNT   B12 deficiency    Consistent w/ supplement. Will recheck      Relevant Orders   Vitamin B12   Avitaminosis D    Consistent w/ supplement. Will recheck      Relevant Orders   Vitamin D (25 hydroxy)     Return in about 4 months (around 05/20/2022) for weight Management, hypertension.      I, LMikey Kirschner PA-C have reviewed all documentation for this visit. The documentation on  01/17/2022  for the exam, diagnosis, procedures, and orders are all accurate and complete.  LMikey Kirschner PA-C [facility]  [place], [place], [address] Office: [phone] Fax: [place]

## 2022-01-17 NOTE — Assessment & Plan Note (Signed)
Consistent w/ supplement. Will recheck

## 2022-01-17 NOTE — Assessment & Plan Note (Signed)
Will check a1c. Again discussed wegovy -- her supplemental insurance may cover. Advised moa, side effects, how to use. Depending on a1c will send ozempic vs wegovy.  Failed metformin trial Ref to nutritionist F/u 7mo

## 2022-01-17 NOTE — Assessment & Plan Note (Addendum)
Elevated in office, recent appt with breast surgeon was 102/70. On review of other practice visits-- always WNR. Continue w/ medication Advising pt to check at home.  Stable decreased kidney function ; will check urine micro and consider referral to nephrology.

## 2022-01-18 LAB — MICROALBUMIN / CREATININE URINE RATIO
Creatinine, Urine: 220.3 mg/dL
Microalb/Creat Ratio: 4 mg/g creat (ref 0–29)
Microalbumin, Urine: 8 ug/mL

## 2022-01-18 LAB — HEMOGLOBIN A1C
Est. average glucose Bld gHb Est-mCnc: 128 mg/dL
Hgb A1c MFr Bld: 6.1 % — ABNORMAL HIGH (ref 4.8–5.6)

## 2022-01-18 LAB — VITAMIN B12: Vitamin B-12: 635 pg/mL (ref 232–1245)

## 2022-01-18 LAB — VITAMIN D 25 HYDROXY (VIT D DEFICIENCY, FRACTURES): Vit D, 25-Hydroxy: 32.8 ng/mL (ref 30.0–100.0)

## 2022-01-20 ENCOUNTER — Encounter: Payer: Self-pay | Admitting: Physician Assistant

## 2022-01-20 ENCOUNTER — Other Ambulatory Visit: Payer: Self-pay | Admitting: Physician Assistant

## 2022-01-20 ENCOUNTER — Ambulatory Visit: Payer: Medicare Other

## 2022-01-20 DIAGNOSIS — R7303 Prediabetes: Secondary | ICD-10-CM

## 2022-01-20 DIAGNOSIS — E669 Obesity, unspecified: Secondary | ICD-10-CM

## 2022-01-20 DIAGNOSIS — N183 Chronic kidney disease, stage 3 unspecified: Secondary | ICD-10-CM

## 2022-01-20 MED ORDER — WEGOVY 0.25 MG/0.5ML ~~LOC~~ SOAJ: 0.2500 mg | SUBCUTANEOUS | 0 refills | Status: DC

## 2022-01-22 ENCOUNTER — Other Ambulatory Visit: Payer: Self-pay | Admitting: Physician Assistant

## 2022-01-22 DIAGNOSIS — N183 Chronic kidney disease, stage 3 unspecified: Secondary | ICD-10-CM

## 2022-01-22 NOTE — Telephone Encounter (Signed)
Please review.  KP

## 2022-01-28 ENCOUNTER — Encounter: Payer: Self-pay | Admitting: Radiation Oncology

## 2022-01-28 ENCOUNTER — Ambulatory Visit
Admission: RE | Admit: 2022-01-28 | Discharge: 2022-01-28 | Disposition: A | Discharge status: Home / Self Care | Payer: Medicare Other | Source: Ambulatory Visit | Attending: Radiation Oncology | Admitting: Radiation Oncology

## 2022-01-28 VITALS — BP 107/77 | HR 88 | Temp 97.7°F | Resp 18 | Ht 70.0 in | Wt 241.4 lb

## 2022-01-28 DIAGNOSIS — C50412 Malignant neoplasm of upper-outer quadrant of left female breast: Secondary | ICD-10-CM | POA: Insufficient documentation

## 2022-01-28 DIAGNOSIS — Z510 Encounter for antineoplastic radiation therapy: Secondary | ICD-10-CM | POA: Insufficient documentation

## 2022-01-28 NOTE — Progress Notes (Signed)
Radiation Oncology Follow up Note  Name: Kathy Farmer   Date:   01/28/2022 MRN:  [id number] DOB: 02-27-1956    This 66 y.o. female presents to the clinic today for follow-up for patient with stage Ia (T1b N0 M0) ER/PR positive invasive mammary carcinoma the left breast status post wide local excision and sentinel node biopsy.Marland Kitchen  REFERRING PROVIDER: Jerrol Banana.,*  HPI: Patient is a 66 year old female originally consulted back in early July for stage Ia ER/PR positive HER2 negative invasive mammary carcinoma left breast status post wide local excision..  She had some significant edema of the breast which has resolved over time.  We were planning on doing possible accelerated partial breast radiation and she is seen today for consultation.  She specifically denies breast tenderness cough or bone pain.  Patient did not require Oncotype DX testing she is scheduled to start letrozole therapy after completion of radiation.  COMPLICATIONS OF TREATMENT: none  FOLLOW UP COMPLIANCE: keeps appointments   PHYSICAL EXAM:  BP 107/77   Pulse 88   Temp 97.7 F (36.5 C)   Resp 18   Ht '5\' 10"'  (1.778 m)   Wt 241 lb 6.4 oz (109.5 kg)   LMP 01/24/2011   BMI 34.64 kg/m  Patient status post wide local excision left breast breast swelling has markedly subsided.  No dominant masses noted in either breast.  No axillary or supraclavicular adenopathy is identified.  Well-developed well-nourished patient in NAD. HEENT reveals PERLA, EOMI, discs not visualized.  Oral cavity is clear. No oral mucosal lesions are identified. Neck is clear without evidence of cervical or supraclavicular adenopathy. Lungs are clear to A&P. Cardiac examination is essentially unremarkable with regular rate and rhythm without murmur rub or thrill. Abdomen is benign with no organomegaly or masses noted. Motor sensory and DTR levels are equal and symmetric in the upper and lower extremities. Cranial nerves II through XII  are grossly intact. Proprioception is intact. No peripheral adenopathy or edema is identified. No motor or sensory levels are noted. Crude visual fields are within normal range.  RADIOLOGY RESULTS: No current films for review  PLAN: At this time it has been difficult to set patient up for insertion of her MammoSite balloon is and is well as high-dose-rate partial breast radiation.  I am switching the patient to external beam IMRT treatment partial breast irradiation 35 Gray in 10 fractions.  Risks and benefits of treatment clued and slight skin reaction fatigue all were discussed with the patient and her husband.  I have set the patient up for simulation later this week.  Based on the long delay to start treatment with MammoSite catheter I believe this is the best interest of the patient and she agrees with my treatment plan.  I would like to take this opportunity to thank you for allowing me to participate in the care of your patient.Noreene Filbert, MD

## 2022-01-31 ENCOUNTER — Ambulatory Visit
Admission: RE | Admit: 2022-01-31 | Discharge: 2022-01-31 | Disposition: A | Discharge status: Home / Self Care | Payer: Medicare Other | Source: Ambulatory Visit | Attending: Radiation Oncology | Admitting: Radiation Oncology

## 2022-01-31 ENCOUNTER — Encounter: Payer: Self-pay | Admitting: *Deleted

## 2022-01-31 DIAGNOSIS — Z510 Encounter for antineoplastic radiation therapy: Secondary | ICD-10-CM | POA: Insufficient documentation

## 2022-01-31 DIAGNOSIS — C50812 Malignant neoplasm of overlapping sites of left female breast: Secondary | ICD-10-CM | POA: Diagnosis present

## 2022-02-05 DIAGNOSIS — Z510 Encounter for antineoplastic radiation therapy: Secondary | ICD-10-CM | POA: Diagnosis not present

## 2022-02-06 ENCOUNTER — Ambulatory Visit: Admission: RE | Admit: 2022-02-06 | Payer: Medicare Other | Source: Ambulatory Visit

## 2022-02-10 ENCOUNTER — Ambulatory Visit
Admission: RE | Admit: 2022-02-10 | Discharge: 2022-02-10 | Disposition: A | Discharge status: Home / Self Care | Payer: Medicare Other | Source: Ambulatory Visit | Attending: Radiation Oncology | Admitting: Radiation Oncology

## 2022-02-10 ENCOUNTER — Other Ambulatory Visit: Payer: Self-pay

## 2022-02-10 DIAGNOSIS — Z510 Encounter for antineoplastic radiation therapy: Secondary | ICD-10-CM | POA: Diagnosis not present

## 2022-02-10 LAB — RAD ONC ARIA SESSION SUMMARY
Course Elapsed Days: 0
Plan Fractions Treated to Date: 1
Plan Prescribed Dose Per Fraction: 3.5 Gy
Plan Total Fractions Prescribed: 10
Plan Total Prescribed Dose: 35 Gy
Reference Point Dosage Given to Date: 3.5 Gy
Reference Point Session Dosage Given: 3.5 Gy
Session Number: 1

## 2022-02-11 ENCOUNTER — Ambulatory Visit
Admission: RE | Admit: 2022-02-11 | Discharge: 2022-02-11 | Disposition: A | Discharge status: Home / Self Care | Payer: Medicare Other | Source: Ambulatory Visit | Attending: Radiation Oncology | Admitting: Radiation Oncology

## 2022-02-11 ENCOUNTER — Other Ambulatory Visit: Payer: Self-pay

## 2022-02-11 ENCOUNTER — Encounter: Payer: Self-pay | Admitting: Physician Assistant

## 2022-02-11 DIAGNOSIS — Z510 Encounter for antineoplastic radiation therapy: Secondary | ICD-10-CM | POA: Diagnosis not present

## 2022-02-11 LAB — RAD ONC ARIA SESSION SUMMARY
Course Elapsed Days: 1
Plan Fractions Treated to Date: 2
Plan Prescribed Dose Per Fraction: 3.5 Gy
Plan Total Fractions Prescribed: 10
Plan Total Prescribed Dose: 35 Gy
Reference Point Dosage Given to Date: 7 Gy
Reference Point Session Dosage Given: 3.5 Gy
Session Number: 2

## 2022-02-12 ENCOUNTER — Other Ambulatory Visit: Payer: Self-pay

## 2022-02-12 ENCOUNTER — Ambulatory Visit
Admission: RE | Admit: 2022-02-12 | Discharge: 2022-02-12 | Disposition: A | Discharge status: Home / Self Care | Payer: Medicare Other | Source: Ambulatory Visit | Attending: Radiation Oncology | Admitting: Radiation Oncology

## 2022-02-12 DIAGNOSIS — Z510 Encounter for antineoplastic radiation therapy: Secondary | ICD-10-CM | POA: Diagnosis not present

## 2022-02-12 LAB — RAD ONC ARIA SESSION SUMMARY
Course Elapsed Days: 2
Plan Fractions Treated to Date: 3
Plan Prescribed Dose Per Fraction: 3.5 Gy
Plan Total Fractions Prescribed: 10
Plan Total Prescribed Dose: 35 Gy
Reference Point Dosage Given to Date: 10.5 Gy
Reference Point Session Dosage Given: 3.5 Gy
Session Number: 3

## 2022-02-13 ENCOUNTER — Ambulatory Visit
Admission: RE | Admit: 2022-02-13 | Discharge: 2022-02-13 | Disposition: A | Discharge status: Home / Self Care | Payer: Medicare Other | Source: Ambulatory Visit | Attending: Radiation Oncology | Admitting: Radiation Oncology

## 2022-02-13 ENCOUNTER — Other Ambulatory Visit: Payer: Self-pay

## 2022-02-13 ENCOUNTER — Inpatient Hospital Stay: Payer: Medicare Other | Attending: Oncology

## 2022-02-13 DIAGNOSIS — Z510 Encounter for antineoplastic radiation therapy: Secondary | ICD-10-CM | POA: Diagnosis not present

## 2022-02-13 DIAGNOSIS — C50412 Malignant neoplasm of upper-outer quadrant of left female breast: Secondary | ICD-10-CM

## 2022-02-13 DIAGNOSIS — C50812 Malignant neoplasm of overlapping sites of left female breast: Secondary | ICD-10-CM | POA: Insufficient documentation

## 2022-02-13 LAB — RAD ONC ARIA SESSION SUMMARY
Course Elapsed Days: 3
Plan Fractions Treated to Date: 4
Plan Prescribed Dose Per Fraction: 3.5 Gy
Plan Total Fractions Prescribed: 10
Plan Total Prescribed Dose: 35 Gy
Reference Point Dosage Given to Date: 14 Gy
Reference Point Session Dosage Given: 3.5 Gy
Session Number: 4

## 2022-02-13 LAB — CBC
HCT: 35.8 % — ABNORMAL LOW (ref 36.0–46.0)
Hemoglobin: 12 g/dL (ref 12.0–15.0)
MCH: 31.1 pg (ref 26.0–34.0)
MCHC: 33.5 g/dL (ref 30.0–36.0)
MCV: 92.7 fL (ref 80.0–100.0)
Platelets: 224 10*3/uL (ref 150–400)
RBC: 3.86 MIL/uL — ABNORMAL LOW (ref 3.87–5.11)
RDW: 13 % (ref 11.5–15.5)
WBC: 5.7 10*3/uL (ref 4.0–10.5)
nRBC: 0 % (ref 0.0–0.2)

## 2022-02-14 ENCOUNTER — Other Ambulatory Visit: Payer: Self-pay

## 2022-02-14 ENCOUNTER — Ambulatory Visit
Admission: RE | Admit: 2022-02-14 | Discharge: 2022-02-14 | Disposition: A | Discharge status: Home / Self Care | Payer: Medicare Other | Source: Ambulatory Visit | Attending: Radiation Oncology | Admitting: Radiation Oncology

## 2022-02-14 DIAGNOSIS — Z510 Encounter for antineoplastic radiation therapy: Secondary | ICD-10-CM | POA: Diagnosis not present

## 2022-02-14 LAB — RAD ONC ARIA SESSION SUMMARY
Course Elapsed Days: 4
Plan Fractions Treated to Date: 5
Plan Prescribed Dose Per Fraction: 3.5 Gy
Plan Total Fractions Prescribed: 10
Plan Total Prescribed Dose: 35 Gy
Reference Point Dosage Given to Date: 17.5 Gy
Reference Point Session Dosage Given: 3.5 Gy
Session Number: 5

## 2022-02-17 ENCOUNTER — Ambulatory Visit
Admission: RE | Admit: 2022-02-17 | Discharge: 2022-02-17 | Disposition: A | Discharge status: Home / Self Care | Payer: Medicare Other | Source: Ambulatory Visit | Attending: Radiation Oncology | Admitting: Radiation Oncology

## 2022-02-17 ENCOUNTER — Other Ambulatory Visit: Payer: Self-pay

## 2022-02-17 DIAGNOSIS — Z510 Encounter for antineoplastic radiation therapy: Secondary | ICD-10-CM | POA: Diagnosis not present

## 2022-02-17 LAB — RAD ONC ARIA SESSION SUMMARY
Course Elapsed Days: 7
Plan Fractions Treated to Date: 6
Plan Prescribed Dose Per Fraction: 3.5 Gy
Plan Total Fractions Prescribed: 10
Plan Total Prescribed Dose: 35 Gy
Reference Point Dosage Given to Date: 21 Gy
Reference Point Session Dosage Given: 3.5 Gy
Session Number: 6

## 2022-02-18 ENCOUNTER — Ambulatory Visit
Admission: RE | Admit: 2022-02-18 | Discharge: 2022-02-18 | Disposition: A | Discharge status: Home / Self Care | Payer: Medicare Other | Source: Ambulatory Visit | Attending: Radiation Oncology | Admitting: Radiation Oncology

## 2022-02-18 ENCOUNTER — Other Ambulatory Visit: Payer: Self-pay

## 2022-02-18 DIAGNOSIS — Z510 Encounter for antineoplastic radiation therapy: Secondary | ICD-10-CM | POA: Diagnosis not present

## 2022-02-18 LAB — RAD ONC ARIA SESSION SUMMARY
Course Elapsed Days: 8
Plan Fractions Treated to Date: 7
Plan Prescribed Dose Per Fraction: 3.5 Gy
Plan Total Fractions Prescribed: 10
Plan Total Prescribed Dose: 35 Gy
Reference Point Dosage Given to Date: 24.5 Gy
Reference Point Session Dosage Given: 3.5 Gy
Session Number: 7

## 2022-02-19 ENCOUNTER — Other Ambulatory Visit: Payer: Self-pay

## 2022-02-19 ENCOUNTER — Ambulatory Visit
Admission: RE | Admit: 2022-02-19 | Discharge: 2022-02-19 | Disposition: A | Discharge status: Home / Self Care | Payer: Medicare Other | Source: Ambulatory Visit | Attending: Radiation Oncology | Admitting: Radiation Oncology

## 2022-02-19 DIAGNOSIS — Z510 Encounter for antineoplastic radiation therapy: Secondary | ICD-10-CM | POA: Diagnosis not present

## 2022-02-19 LAB — RAD ONC ARIA SESSION SUMMARY
Course Elapsed Days: 9
Plan Fractions Treated to Date: 8
Plan Prescribed Dose Per Fraction: 3.5 Gy
Plan Total Fractions Prescribed: 10
Plan Total Prescribed Dose: 35 Gy
Reference Point Dosage Given to Date: 28 Gy
Reference Point Session Dosage Given: 3.5 Gy
Session Number: 8

## 2022-02-20 ENCOUNTER — Other Ambulatory Visit: Payer: Self-pay

## 2022-02-20 ENCOUNTER — Inpatient Hospital Stay: Payer: Medicare Other

## 2022-02-20 ENCOUNTER — Ambulatory Visit
Admission: RE | Admit: 2022-02-20 | Discharge: 2022-02-20 | Disposition: A | Discharge status: Home / Self Care | Payer: Medicare Other | Source: Ambulatory Visit | Attending: Radiation Oncology | Admitting: Radiation Oncology

## 2022-02-20 DIAGNOSIS — Z510 Encounter for antineoplastic radiation therapy: Secondary | ICD-10-CM | POA: Diagnosis not present

## 2022-02-20 LAB — CBC
HCT: 35.3 % — ABNORMAL LOW (ref 36.0–46.0)
Hemoglobin: 12.1 g/dL (ref 12.0–15.0)
MCH: 31.5 pg (ref 26.0–34.0)
MCHC: 34.3 g/dL (ref 30.0–36.0)
MCV: 91.9 fL (ref 80.0–100.0)
Platelets: 212 10*3/uL (ref 150–400)
RBC: 3.84 MIL/uL — ABNORMAL LOW (ref 3.87–5.11)
RDW: 12.8 % (ref 11.5–15.5)
WBC: 5.7 10*3/uL (ref 4.0–10.5)
nRBC: 0 % (ref 0.0–0.2)

## 2022-02-20 LAB — RAD ONC ARIA SESSION SUMMARY
Course Elapsed Days: 10
Plan Fractions Treated to Date: 9
Plan Prescribed Dose Per Fraction: 3.5 Gy
Plan Total Fractions Prescribed: 10
Plan Total Prescribed Dose: 35 Gy
Reference Point Dosage Given to Date: 31.5 Gy
Reference Point Session Dosage Given: 3.5 Gy
Session Number: 9

## 2022-02-21 ENCOUNTER — Ambulatory Visit
Admission: RE | Admit: 2022-02-21 | Discharge: 2022-02-21 | Disposition: A | Discharge status: Home / Self Care | Payer: Medicare Other | Source: Ambulatory Visit | Attending: Radiation Oncology | Admitting: Radiation Oncology

## 2022-02-21 ENCOUNTER — Encounter: Payer: Self-pay | Admitting: *Deleted

## 2022-02-21 ENCOUNTER — Other Ambulatory Visit: Payer: Self-pay

## 2022-02-21 DIAGNOSIS — Z510 Encounter for antineoplastic radiation therapy: Secondary | ICD-10-CM | POA: Diagnosis not present

## 2022-02-21 LAB — RAD ONC ARIA SESSION SUMMARY
Course Elapsed Days: 11
Plan Fractions Treated to Date: 10
Plan Prescribed Dose Per Fraction: 3.5 Gy
Plan Total Fractions Prescribed: 10
Plan Total Prescribed Dose: 35 Gy
Reference Point Dosage Given to Date: 35 Gy
Reference Point Session Dosage Given: 3.5 Gy
Session Number: 10

## 2022-02-25 ENCOUNTER — Ambulatory Visit
Admission: RE | Admit: 2022-02-25 | Discharge: 2022-02-25 | Disposition: A | Discharge status: Home / Self Care | Payer: Medicare Other | Source: Ambulatory Visit | Attending: Oncology | Admitting: Oncology

## 2022-02-25 DIAGNOSIS — Z780 Asymptomatic menopausal state: Secondary | ICD-10-CM

## 2022-02-25 DIAGNOSIS — Z170 Estrogen receptor positive status [ER+]: Secondary | ICD-10-CM | POA: Diagnosis present

## 2022-02-25 DIAGNOSIS — C50412 Malignant neoplasm of upper-outer quadrant of left female breast: Secondary | ICD-10-CM | POA: Diagnosis present

## 2022-02-27 ENCOUNTER — Encounter: Payer: Self-pay | Admitting: *Deleted

## 2022-03-07 ENCOUNTER — Other Ambulatory Visit: Payer: Self-pay | Admitting: Nephrology

## 2022-03-07 ENCOUNTER — Other Ambulatory Visit: Payer: Self-pay | Admitting: Physician Assistant

## 2022-03-07 DIAGNOSIS — N1832 Chronic kidney disease, stage 3b: Secondary | ICD-10-CM

## 2022-03-14 ENCOUNTER — Encounter: Payer: Self-pay | Admitting: Physician Assistant

## 2022-03-14 ENCOUNTER — Ambulatory Visit (INDEPENDENT_AMBULATORY_CARE_PROVIDER_SITE_OTHER): Payer: Medicare Other | Admitting: Physician Assistant

## 2022-03-14 VITALS — BP 114/71 | HR 89 | Wt 241.7 lb

## 2022-03-14 DIAGNOSIS — E669 Obesity, unspecified: Secondary | ICD-10-CM | POA: Diagnosis not present

## 2022-03-14 MED ORDER — CONTRAVE 8-90 MG PO TB12: ORAL_TABLET | ORAL | 0 refills | Status: DC

## 2022-03-14 NOTE — Patient Instructions (Signed)
CONTRAVE dose escalation schedule (2.1): Morning Dose    Evening Dose Week 1   1 tablet   None Week 2   1 tablet   1 tablet Week 3   2 tablets   1 tablet Week 4 - Onward  2 tablets   2 tablets

## 2022-03-14 NOTE — Assessment & Plan Note (Addendum)
W/ comorbidities of HTN, pre-diabetes, HLD. Pt has no seizure history as far as I am aware. Discussed contrave-- MOA, SE, avoid etoh. Advised taper schedule F/u 3 mo

## 2022-03-14 NOTE — Progress Notes (Unsigned)
     I,Sha'taria Tyson,acting as a Education administrator for [employer], PA-C.,have documented all relevant documentation on the behalf of [name], PA-C,as directed by  [name], PA-C while in the presence of [name], PA-C.   Established patient visit   Patient: Kathy Farmer   DOB: 11-Jan-1956   66 y.o. Female  MRN: [id number] Visit Date: 03/14/2022  Today's healthcare provider: [name], PA-C   No chief complaint on file.  Subjective    HPI  -Patient declined flu vaccine. Would like to wait a little longer to receive. Advised patient of flu clinic -Patient is being seen today to discuss weight management options.   Medications: Outpatient Medications Prior to Visit  Medication Sig  . Cholecalciferol (VITAMIN D-3) 25 MCG (1000 UT) CAPS Take 1,000 Units by mouth daily.  . cyanocobalamin 1000 MCG tablet Take 1,000 mcg by mouth daily.  Marland Kitchen letrozole (FEMARA) 2.5 MG tablet Take 1 tablet (2.5 mg total) by mouth daily.  Marland Kitchen losartan-hydrochlorothiazide (HYZAAR) 50-12.5 MG tablet TAKE ONE TABLET BY MOUTH DAILY   No facility-administered medications prior to visit.    Review of Systems  Constitutional:  Negative for fatigue and fever.  Respiratory:  Negative for cough and shortness of breath.   Cardiovascular:  Negative for chest pain and leg swelling.  Gastrointestinal:  Negative for abdominal pain.  Neurological:  Negative for dizziness and headaches.      Objective    Blood pressure 114/71, pulse 89, weight 241 lb 11.2 oz (109.6 kg), last menstrual period 01/24/2011, SpO2 100 %.   Physical Exam Vitals reviewed.  Constitutional:      Appearance: She is not ill-appearing.  HENT:     Head: Normocephalic.  Eyes:     Conjunctiva/sclera: Conjunctivae normal.  Cardiovascular:     Rate and Rhythm: Normal rate.  Pulmonary:     Effort: Pulmonary effort is normal. No respiratory distress.  Neurological:     General: No focal deficit present.     Mental  Status: She is alert and oriented to person, place, and time.  Psychiatric:        Mood and Affect: Mood normal.        Behavior: Behavior normal.     No results found for any visits on 03/14/22.  Assessment & Plan     Problem List Items Addressed This Visit       Other   Obesity (BMI 30-39.9) - Primary    W/ comorbidities of HTN, pre-diabetes, HLD. Pt has no seizure history as far as I am aware. Discussed contrave-- MOA, SE, avoid etoh. Advised taper schedule F/u 3 mo       Relevant Medications   Naltrexone-buPROPion HCl ER (CONTRAVE) 8-90 MG TB12     Return in about 3 months (around 06/13/2022) for weight Management.      I, [name], PA-C have reviewed all documentation for this visit. The documentation on  03/14/2022  for the exam, diagnosis, procedures, and orders are all accurate and complete.  [name], PA-C [facility] [address] [place], [place], [address] Office: [phone] Fax: [place]

## 2022-03-18 ENCOUNTER — Ambulatory Visit
Admission: RE | Admit: 2022-03-18 | Discharge: 2022-03-18 | Disposition: A | Discharge status: Home / Self Care | Payer: Medicare Other | Source: Ambulatory Visit | Attending: Nephrology | Admitting: Nephrology

## 2022-03-18 DIAGNOSIS — N1832 Chronic kidney disease, stage 3b: Secondary | ICD-10-CM | POA: Diagnosis present

## 2022-04-04 ENCOUNTER — Ambulatory Visit: Payer: Medicare Other | Admitting: Radiation Oncology

## 2022-04-05 ENCOUNTER — Other Ambulatory Visit: Payer: Self-pay | Admitting: Oncology

## 2022-04-11 ENCOUNTER — Inpatient Hospital Stay: Payer: Medicare Other

## 2022-04-11 ENCOUNTER — Encounter: Payer: Self-pay | Admitting: Oncology

## 2022-04-11 ENCOUNTER — Inpatient Hospital Stay: Payer: Medicare Other | Attending: Oncology | Admitting: Oncology

## 2022-04-11 VITALS — BP 141/76 | HR 69 | Temp 97.9°F | Resp 16 | Ht 70.0 in | Wt 233.0 lb

## 2022-04-11 DIAGNOSIS — Z7189 Other specified counseling: Secondary | ICD-10-CM

## 2022-04-11 DIAGNOSIS — Z170 Estrogen receptor positive status [ER+]: Secondary | ICD-10-CM | POA: Diagnosis not present

## 2022-04-11 DIAGNOSIS — Z79811 Long term (current) use of aromatase inhibitors: Secondary | ICD-10-CM | POA: Diagnosis not present

## 2022-04-11 DIAGNOSIS — M8580 Other specified disorders of bone density and structure, unspecified site: Secondary | ICD-10-CM | POA: Insufficient documentation

## 2022-04-11 DIAGNOSIS — M85851 Other specified disorders of bone density and structure, right thigh: Secondary | ICD-10-CM | POA: Diagnosis not present

## 2022-04-11 DIAGNOSIS — C50812 Malignant neoplasm of overlapping sites of left female breast: Secondary | ICD-10-CM | POA: Diagnosis present

## 2022-04-11 DIAGNOSIS — C50412 Malignant neoplasm of upper-outer quadrant of left female breast: Secondary | ICD-10-CM

## 2022-04-11 LAB — COMPREHENSIVE METABOLIC PANEL
ALT: 40 U/L (ref 0–44)
AST: 41 U/L (ref 15–41)
Albumin: 3.9 g/dL (ref 3.5–5.0)
Alkaline Phosphatase: 49 U/L (ref 38–126)
Anion gap: 10 (ref 5–15)
BUN: 22 mg/dL (ref 8–23)
CO2: 23 mmol/L (ref 22–32)
Calcium: 9 mg/dL (ref 8.9–10.3)
Chloride: 106 mmol/L (ref 98–111)
Creatinine, Ser: 1.62 mg/dL — ABNORMAL HIGH (ref 0.44–1.00)
GFR, Estimated: 35 mL/min — ABNORMAL LOW (ref >60)
Glucose, Bld: 136 mg/dL — ABNORMAL HIGH (ref 70–99)
Potassium: 3.7 mmol/L (ref 3.5–5.1)
Sodium: 139 mmol/L (ref 135–145)
Total Bilirubin: 0.7 mg/dL (ref 0.3–1.2)
Total Protein: 6.9 g/dL (ref 6.5–8.1)

## 2022-04-11 MED ORDER — ALENDRONATE SODIUM 70 MG PO TABS
70.0000 mg | ORAL_TABLET | ORAL | 0 refills | Status: DC
Start: 2022-04-11 — End: 2022-10-15

## 2022-04-11 NOTE — Progress Notes (Unsigned)
Pt has not breast concerns but was dx with kidney disease

## 2022-04-13 NOTE — Progress Notes (Signed)
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  This was followed by diagnostic mammogram and ultrasound which showed architectural distortion in the lateral left breast at the 3 o'clock position.  There were a group of cysts measuring 7 x 2 x 3 mm.  Stereotactic biopsy of the architectural distortion was recommended.  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     Final pathology showed 8 mm grade 1 tumor with negative margins. 5 Sentinel lymph nodes negative for malignancy.  Genetic testing negative.patient completed adjuvant radiation and started letrozole in sept 2023    Interval history-patient started letrozole in September 2023 and is tolerating it well without any significant side effects.  Occasional hot flashes.  ECOG PS- 0 Pain scale- 0   Review of systems- Review of Systems  Constitutional:  Negative for chills, fever, malaise/fatigue and weight loss.  HENT:  Negative for congestion, ear discharge and nosebleeds.   Eyes:  Negative for blurred vision.  Respiratory:  Negative for cough, hemoptysis, sputum production, shortness of breath and wheezing.   Cardiovascular:  Negative for chest pain, palpitations, orthopnea and claudication.   Gastrointestinal:  Negative for abdominal pain, blood in stool, constipation, diarrhea, heartburn, melena, nausea and vomiting.  Genitourinary:  Negative for dysuria, flank pain, frequency, hematuria and urgency.  Musculoskeletal:  Negative for back pain, joint pain and myalgias.  Skin:  Negative for rash.  Neurological:  Negative for dizziness, tingling, focal weakness, seizures, weakness and headaches.  Endo/Heme/Allergies:  Does not bruise/bleed easily.  Psychiatric/Behavioral:  Negative for depression and suicidal ideas. The patient does not have insomnia.       No Known Allergies   Past Medical History:  Diagnosis Date   Anemia    Breast cancer ([place])    Depression    Dysrhythmia    racing heart- neg workup   GERD (gastroesophageal reflux disease)    History of PCOS    Hypertension    Kidney disease    44%  function   Pre-diabetes    Sleep apnea    inconclusive sleep study     Past Surgical History:  Procedure Laterality Date   ABDOMINAL HYSTERECTOMY     AXILLARY SENTINEL NODE BIOPSY Left 12/23/2021   Procedure: AXILLARY SENTINEL NODE BIOPSY;  Surgeon: Robert Bellow, MD;  Location: [facility] ORS;  Service: General;  Laterality: Left;   BREAST BIOPSY Left 11/18/2021   lt br stereo, distortion, x clip, [facility]   BREAST LUMPECTOMY Left 12/23/2021   [facility]   BREAST LUMPECTOMY WITH NEEDLE LOCALIZATION Left 12/23/2021   Procedure: BREAST LUMPECTOMY WITH NEEDLE LOCALIZATION;  Surgeon: Robert Bellow, MD;  Location: [facility] ORS;  Service: General;  Laterality: Left;   CESAREAN SECTION     DIAGNOSTIC LAPAROSCOPY     DILATION AND CURETTAGE OF UTERUS  06/30/2009  with hysteroecopy   TUBAL LIGATION Bilateral     Social History   Socioeconomic History   Marital status: Married    Spouse name: Not on file   Number of children: 2   Years of education: Not on file   Highest education level: Not on file  Occupational History    Employer: [employer]  Tobacco Use    Smoking status: Never   Smokeless tobacco: Never  Vaping Use   Vaping Use: Never used  Substance and Sexual Activity   Alcohol use: Not Currently   Drug use: No   Sexual activity: Yes    Partners: Male  Other Topics Concern   Not on file  Social History Narrative   Not on file   Social Determinants of Health   Financial Resource Strain: Not on file  Food Insecurity: Not on file  Transportation Needs: Not on file  Physical Activity: Not on file  Stress: Not on file  Social Connections: Not on file  Intimate Partner Violence: Not on file    Family History  Problem Relation Age of Onset   Breast cancer Mother 37   Osteoporosis Mother    Diabetes Father    Hypertension Father    Cancer Brother        cholangiocarcinoma   Breast cancer Maternal Aunt 78   Pancreatic cancer Maternal Uncle    Lung cancer Maternal Grandfather    Pancreatic cancer Paternal Grandmother    Cancer - Other Paternal Grandmother        cholangiocarcinoma     Current Outpatient Medications:    alendronate (FOSAMAX) 70 MG tablet, Take 1 tablet (70 mg total) by mouth once a week. Take with a full glass of water on an empty stomach., Disp: 24 tablet, Rfl: 0   Cholecalciferol (VITAMIN D-3) 25 MCG (1000 UT) CAPS, Take 1,000 Units by mouth 2 (two) times daily., Disp: , Rfl:    cyanocobalamin 1000 MCG tablet, Take 1,000 mcg by mouth daily., Disp: , Rfl:    doxylamine, Sleep, (UNISOM) 25 MG tablet, Take 25 mg by mouth at bedtime as needed., Disp: , Rfl:    famotidine (PEPCID) 20 MG tablet, Take 20 mg by mouth daily as needed for heartburn or indigestion., Disp: , Rfl:    letrozole (FEMARA) 2.5 MG tablet, TAKE 1 TABLET BY MOUTH DAILY, Disp: 90 tablet, Rfl: 0   losartan-hydrochlorothiazide (HYZAAR) 50-12.5 MG tablet, TAKE ONE TABLET BY MOUTH DAILY, Disp: 90 tablet, Rfl: 3  Physical exam:  Vitals:   04/11/22 1042  BP: (!) 141/76  Pulse: 69  Resp: 16  Temp: 97.9 F (36.6 C)  TempSrc: Oral  Weight:  233 lb (105.7 kg)  Height: '5\' 10"'  (1.778 m)   Physical Exam Constitutional:      General: She is not in acute distress. Cardiovascular:     Rate and Rhythm: Normal rate and regular rhythm.     Heart sounds: Normal heart sounds.  Pulmonary:     Effort: Pulmonary effort is normal.     Breath sounds: Normal breath sounds.  Abdominal:     General: Bowel sounds are normal.     Palpations: Abdomen is soft.  Skin:    General: Skin is warm and dry.  Neurological:     Mental Status: She is alert and oriented to person, place, and time.         Latest Ref Rng & Units 04/11/2022   10:06 AM  CMP  Glucose 70 -  99 mg/dL 136   BUN 8 - 23 mg/dL 22   Creatinine 0.44 - 1.00 mg/dL 1.62   Sodium 135 - 145 mmol/L 139   Potassium 3.5 - 5.1 mmol/L 3.7   Chloride 98 - 111 mmol/L 106   CO2 22 - 32 mmol/L 23   Calcium 8.9 - 10.3 mg/dL 9.0   Total Protein 6.5 - 8.1 g/dL 6.9   Total Bilirubin 0.3 - 1.2 mg/dL 0.7   Alkaline Phos 38 - 126 U/L 49   AST 15 - 41 U/L 41   ALT 0 - 44 U/L 40       Latest Ref Rng & Units 02/20/2022    8:22 AM  CBC  WBC 4.0 - 10.5 K/uL 5.7   Hemoglobin 12.0 - 15.0 g/dL 12.1   Hematocrit 36.0 - 46.0 % 35.3   Platelets 150 - 400 K/uL 212     No images are attached to the encounter.  US RENAL  Result Date: 03/19/2022 CLINICAL DATA:  Chronic renal disease EXAM: RENAL / URINARY TRACT ULTRASOUND COMPLETE COMPARISON:  None Available. FINDINGS: Right Kidney: Renal measurements: 9.2 x 4.2 x 5.5 cm = volume: 111 mL. Echogenicity within normal limits. No mass or hydronephrosis visualized. Left Kidney: Renal measurements: 9.2 x 5.3 x 5.4 cm = volume: 139 mL. Echogenicity within normal limits. No mass or hydronephrosis visualized. Bladder: Appears normal for degree of bladder distention. Other: None. IMPRESSION: Normal study.  No cause for chronic renal disease identified. Electronically Signed   By: Dorise Bullion III M.D.   On: 03/19/2022 12:13     Assessment and plan-  Patient is a 66 y.o. 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  She is status postlumpectomy and adjuvant radiation therapy.  She is currently on letrozole and this is a routine follow-up visit  Patient is tolerating letrozole well without Any significant side effects.  I discussed the results of her bone density scan which does show baseline osteopenia with a 10-year probability of a major osteoporotic fracture at 29.6% and hip fracture at 2.8% which would be considered significant.  Especially now that she is on letrozole this can potentially make her bone health worse.  We discussed that regardless of whether we use AI or not she needs more than calcium and vitamin D to protect her bones and I would recommend starting off with weekly Fosamax given that she has not had any prior fragility fractures in her T score is better than -3.  Discussed risks and benefits of weekly Fosamax including all but not limited to reflux related side effects.  We did discuss parenteral bisphosphonates as potential alternatives but that it would not carry a potential risk of osteonecrosis of the jaw.  Patient is willing to try weekly Fosamax at this time.  It would be okay for patient to continue letrozole at this time since we are starting Fosamax for her osteopenia.  We discussed the alternative of tamoxifen.  However it would carry some increased risk of cataracts as well as DVTs.  She has already undergone hysterectomy and therefore would not have to worry about risk of uterine cancer with tamoxifen.   Visit Diagnosis 1. Osteopenia of neck of right femur   2. Goals of care, counseling/discussion   3. Malignant neoplasm of upper-outer quadrant of left breast in female, estrogen receptor positive ([place])   4. Use of letrozole (Femara)  Dr. Randa Evens, MD, MPH [facility] [phone] 04/13/2022 4:56 PM

## 2022-04-14 ENCOUNTER — Encounter: Payer: Self-pay | Admitting: Radiation Oncology

## 2022-04-14 ENCOUNTER — Ambulatory Visit
Admission: RE | Admit: 2022-04-14 | Discharge: 2022-04-14 | Disposition: A | Discharge status: Home / Self Care | Payer: Medicare Other | Source: Ambulatory Visit | Attending: Radiation Oncology | Admitting: Radiation Oncology

## 2022-04-14 VITALS — BP 136/96 | HR 74 | Temp 97.1°F | Resp 16 | Ht 70.0 in | Wt 236.0 lb

## 2022-04-14 DIAGNOSIS — Z170 Estrogen receptor positive status [ER+]: Secondary | ICD-10-CM | POA: Diagnosis not present

## 2022-04-14 DIAGNOSIS — C50412 Malignant neoplasm of upper-outer quadrant of left female breast: Secondary | ICD-10-CM | POA: Insufficient documentation

## 2022-04-14 DIAGNOSIS — Z923 Personal history of irradiation: Secondary | ICD-10-CM | POA: Insufficient documentation

## 2022-04-14 DIAGNOSIS — Z79811 Long term (current) use of aromatase inhibitors: Secondary | ICD-10-CM | POA: Diagnosis not present

## 2022-04-14 NOTE — Progress Notes (Signed)
Radiation Oncology Follow up Note  Name: Kathy Farmer   Date:   04/14/2022 MRN:  [id number] DOB: 15-Feb-1956    This 66 y.o. female presents to the clinic today for 1 month follow-up status post whole breast radiation to her left breast for stage Ia ER positive invasive mammary carcinoma.  REFERRING PROVIDER: Jerrol Banana.,*  HPI: Patient is a 66 year old female now at 1 month having completed whole breast radiation to her left breast for stage Ia (T1b N0 M0) ER/PR positive invasive mammary carcinoma.  Seen today in routine follow-up she is doing well.  She specifically denies breast tenderness cough or bone pain..  She has been started on Femara tolerating it well without side effect.  COMPLICATIONS OF TREATMENT: none  FOLLOW UP COMPLIANCE: keeps appointments   PHYSICAL EXAM:  BP (!) 136/96 (BP Location: Right Arm, Patient Position: Sitting, Cuff Size: Normal)   Pulse 74   Temp (!) 97.1 F (36.2 C) (Tympanic)   Resp 16   Ht '5\' 10"'$  (1.778 m) Comment: stated HT  Wt 236 lb (107 kg)   LMP 01/24/2011   BMI 33.86 kg/m  Lungs are clear to A&P cardiac examination essentially unremarkable with regular rate and rhythm. No dominant mass or nodularity is noted in either breast in 2 positions examined. Incision is well-healed. No axillary or supraclavicular adenopathy is appreciated. Cosmetic result is excellent.  Well-developed well-nourished patient in NAD. HEENT reveals PERLA, EOMI, discs not visualized.  Oral cavity is clear. No oral mucosal lesions are identified. Neck is clear without evidence of cervical or supraclavicular adenopathy. Lungs are clear to A&P. Cardiac examination is essentially unremarkable with regular rate and rhythm without murmur rub or thrill. Abdomen is benign with no organomegaly or masses noted. Motor sensory and DTR levels are equal and symmetric in the upper and lower extremities. Cranial nerves II through XII are grossly intact. Proprioception is  intact. No peripheral adenopathy or edema is identified. No motor or sensory levels are noted. Crude visual fields are within normal range.  RADIOLOGY RESULTS: No current films for review  PLAN: Present time patient is doing well very low side effect profile 1 month out from whole breast radiation and pleased with her overall progress.  I have asked to see her back in 6 months for follow-up.  She continues on Femara without side effect.  Patient is to call with any concerns.  I would like to take this opportunity to thank you for allowing me to participate in the care of your patient.Noreene Filbert, MD

## 2022-05-18 ENCOUNTER — Encounter: Payer: Self-pay | Admitting: Physician Assistant

## 2022-05-19 NOTE — Progress Notes (Unsigned)
     I,Kathy Farmer,acting as a Education administrator for [employer], PA-C.,have documented all relevant documentation on the behalf of Kathy Kirschner, PA-C,as directed by  Kathy Kirschner, PA-C while in the presence of Kathy Kirschner, PA-C.   Established patient visit   Patient: Kathy Farmer   DOB: 21-Jul-1955   66 y.o. Female  MRN: [id number] Visit Date: 05/20/2022  Today's healthcare provider: [name], PA-C   No chief complaint on file.  Subjective    HPI  ***  Medications: Outpatient Medications Prior to Visit  Medication Sig   alendronate (FOSAMAX) 70 MG tablet Take 1 tablet (70 mg total) by mouth once a week. Take with a full glass of water on an empty stomach.   Cholecalciferol (VITAMIN D-3) 25 MCG (1000 UT) CAPS Take 1,000 Units by mouth 2 (two) times daily.   cyanocobalamin 1000 MCG tablet Take 1,000 mcg by mouth daily.   doxylamine, Sleep, (UNISOM) 25 MG tablet Take 25 mg by mouth at bedtime as needed.   famotidine (PEPCID) 20 MG tablet Take 20 mg by mouth daily as needed for heartburn or indigestion.   letrozole (FEMARA) 2.5 MG tablet TAKE 1 TABLET BY MOUTH DAILY   losartan-hydrochlorothiazide (HYZAAR) 50-12.5 MG tablet TAKE ONE TABLET BY MOUTH DAILY   No facility-administered medications prior to visit.    Review of Systems  {Labs  Heme  Chem  Endocrine  Serology  Results Review (optional):23779}   Objective    LMP 01/24/2011  {Show previous vital signs (optional):23777}  Physical Exam  ***  No results found for any visits on 05/20/22.  Assessment & Plan     ***  No follow-ups on file.      {provider attestation***:1}   Kathy Kirschner, PA-C  [facility] [phone] (phone) [phone] (fax)  [place]

## 2022-05-20 ENCOUNTER — Ambulatory Visit (INDEPENDENT_AMBULATORY_CARE_PROVIDER_SITE_OTHER): Payer: Medicare Other | Admitting: Physician Assistant

## 2022-05-20 ENCOUNTER — Encounter: Payer: Self-pay | Admitting: Physician Assistant

## 2022-05-20 VITALS — BP 111/72 | HR 79 | Wt 235.9 lb

## 2022-05-20 DIAGNOSIS — Z1331 Encounter for screening for depression: Secondary | ICD-10-CM

## 2022-05-20 DIAGNOSIS — M79671 Pain in right foot: Secondary | ICD-10-CM | POA: Diagnosis not present

## 2022-05-20 DIAGNOSIS — E669 Obesity, unspecified: Secondary | ICD-10-CM | POA: Diagnosis not present

## 2022-05-20 DIAGNOSIS — R051 Acute cough: Secondary | ICD-10-CM

## 2022-05-20 DIAGNOSIS — G4700 Insomnia, unspecified: Secondary | ICD-10-CM

## 2022-05-20 MED ORDER — BENZONATATE 100 MG PO CAPS: 100.0000 mg | ORAL_CAPSULE | Freq: Two times a day (BID) | ORAL | 0 refills | Status: DC | PRN

## 2022-05-20 MED ORDER — TRAZODONE HCL 50 MG PO TABS: 25.0000 mg | ORAL_TABLET | Freq: Every evening | ORAL | 3 refills | Status: DC | PRN

## 2022-05-20 NOTE — Assessment & Plan Note (Signed)
Discussed hydroxyzine vs trazodone, as unisom didn't work more inclined to try trazodone. Advised 25-50 mg qhs.  She also has a history of sleep apnea? Will discuss next visit

## 2022-05-20 NOTE — Assessment & Plan Note (Signed)
Pt did not tolerate contrave. Unfortunately 2/2 insurance, CKD, HTN, pt is not eligible or insurance will not pay for other alternatives, and alternatives w/o insurance are too expensive. Diet/exercise our best option.

## 2022-05-20 NOTE — Patient Instructions (Addendum)
Ciro Backer, MS, [place], [facility], [place], Licensed Professional Counselor Address: [address] [place], [place] [address] Phone: [phone] ACCEPTING [place] and in office visits available.* Call [phone]

## 2022-05-20 NOTE — Assessment & Plan Note (Signed)
Residual viral cough Recommending fluids, rx tessalon BID

## 2022-05-20 NOTE — Assessment & Plan Note (Signed)
Musculoskeletal pain vs neuropathy either from surgery or CKD?  Advised starting w/ PT referral/consult to eval for musculoskeletal component and can always refer to podiatry in the future

## 2022-05-20 NOTE — Assessment & Plan Note (Signed)
Discussed therapy. Pt used to see a provider in [place], recommending reaching out to her again

## 2022-05-30 ENCOUNTER — Other Ambulatory Visit: Payer: Self-pay | Admitting: Physician Assistant

## 2022-05-30 DIAGNOSIS — G4700 Insomnia, unspecified: Secondary | ICD-10-CM

## 2022-05-30 MED ORDER — HYDROXYZINE PAMOATE 50 MG PO CAPS: 50.0000 mg | ORAL_CAPSULE | Freq: Every evening | ORAL | 1 refills | Status: DC | PRN

## 2022-06-02 ENCOUNTER — Ambulatory Visit: Payer: Self-pay | Admitting: *Deleted

## 2022-06-02 NOTE — Telephone Encounter (Signed)
Summary: diarrhea   Pt states she has had diarrhea x5d  Pt requesting a cb for advice on how to treat symptoms  Please advise         Chief Complain: diarrhea x 5 days.  Symptoms: diarrhea  after drinking any fluids. Cough continues and after coughing small amount watery diarrhea noted. Greater than 8 times diarrhea today only in small amounts. After drinking any liquids diarrhea noted again. "What I put in comes right back out". Yellow watery stool. No blood. Low abdominal pain dull. No severe. Nausea no vomiting. Dizziness, able to urinate within 8 hours. Hot/ cold episodes. Frequency: 5 days ago  Pertinent Negatives: Patient denies chest pain no difficulty breathing no fever unable to check temp. Disposition: '[]'$ ED /'[]'$ Urgent Care (no appt availability in office) / '[]'$ Appointment(In office/virtual)/ '[]'$  [facility]/ '[]'$ Home Care/ '[]'$ Refused Recommended Disposition /'[]'$ [place] [facility]/ '[x]'$  Follow-up with PCP Additional Notes:   Please advise if appt ok or need to go to ED. No appt scheduled today .  Patient would like to know what provider suggests.  Please call back.      Reason for Disposition  [1] SEVERE diarrhea (e.g., 7 or more times / day more than normal) AND [2] age > 60 years  Answer Assessment - Initial Assessment Questions 1. DIARRHEA SEVERITY: "How bad is the diarrhea?" "How many more stools have you had in the past 24 hours than normal?"    - NO DIARRHEA (SCALE 0)   - MILD (SCALE 1-3): Few loose or mushy BMs; increase of 1-3 stools over normal daily number of stools; mild increase in ostomy output.   -  MODERATE (SCALE 4-7): Increase of 4-6 stools daily over normal; moderate increase in ostomy output.   -  SEVERE (SCALE 8-10; OR "WORST POSSIBLE"): Increase of 7 or more stools daily over normal; moderate increase in ostomy output; incontinence.     Severe- small amounts multiple times a day  2. ONSET: "When did the diarrhea begin?"      Thursday approx 5  days ago  3. BM CONSISTENCY: "How loose or watery is the diarrhea?"      Watery  4. VOMITING: "Are you also vomiting?" If Yes, ask: "How many times in the past 24 hours?"      no 5. ABDOMEN PAIN: "Are you having any abdomen pain?" If Yes, ask: "What does it feel like?" (e.g., crampy, dull, intermittent, constant)      Dull pain  6. ABDOMEN PAIN SEVERITY: If present, ask: "How bad is the pain?"  (e.g., Scale 1-10; mild, moderate, or severe)   - MILD (1-3): doesn't interfere with normal activities, abdomen soft and not tender to touch    - MODERATE (4-7): interferes with normal activities or awakens from sleep, abdomen tender to touch    - SEVERE (8-10): excruciating pain, doubled over, unable to do any normal activities       Lower abdominal pain dull pain  7. ORAL INTAKE: If vomiting, "Have you been able to drink liquids?" "How much liquids have you had in the past 24 hours?"     Able to drink but diarrhea noted within minutes of drinking  8. HYDRATION: "Any signs of dehydration?" (e.g., dry mouth [not just dry lips], too weak to stand, dizziness, new weight loss) "When did you last urinate?"     Dry mouth , dizziness able to urinate atleast once every 8 hours. 9. EXPOSURE: "Have you traveled to a foreign country recently?" "Have  you been exposed to anyone with diarrhea?" "Could you have eaten any food that was spoiled?"     Na  10. ANTIBIOTIC USE: "Are you taking antibiotics now or have you taken antibiotics in the past 2 months?"       No  11. OTHER SYMPTOMS: "Do you have any other symptoms?" (e.g., fever, blood in stool)       No blood in stool , feels hot/cold  12. PREGNANCY: "Is there any chance you are pregnant?" "When was your last menstrual period?"       na  Protocols used: [facility]

## 2022-06-02 NOTE — Telephone Encounter (Signed)
Kathy Farmer had called her and left a message  Advised office visit vs urgent care

## 2022-07-03 ENCOUNTER — Encounter: Payer: Self-pay | Admitting: Oncology

## 2022-07-09 ENCOUNTER — Other Ambulatory Visit: Payer: Self-pay | Admitting: Oncology

## 2022-07-23 ENCOUNTER — Inpatient Hospital Stay: Payer: Medicare Other | Attending: Oncology | Admitting: Oncology

## 2022-07-23 ENCOUNTER — Other Ambulatory Visit: Payer: Self-pay | Admitting: *Deleted

## 2022-07-23 VITALS — BP 135/75 | HR 87 | Resp 18 | Wt 236.0 lb

## 2022-07-23 DIAGNOSIS — Z79811 Long term (current) use of aromatase inhibitors: Secondary | ICD-10-CM | POA: Diagnosis not present

## 2022-07-23 DIAGNOSIS — Z170 Estrogen receptor positive status [ER+]: Secondary | ICD-10-CM | POA: Insufficient documentation

## 2022-07-23 DIAGNOSIS — Z7983 Long term (current) use of bisphosphonates: Secondary | ICD-10-CM | POA: Diagnosis not present

## 2022-07-23 DIAGNOSIS — C50812 Malignant neoplasm of overlapping sites of left female breast: Secondary | ICD-10-CM | POA: Diagnosis present

## 2022-07-23 DIAGNOSIS — C50412 Malignant neoplasm of upper-outer quadrant of left female breast: Secondary | ICD-10-CM | POA: Diagnosis not present

## 2022-07-23 NOTE — Progress Notes (Signed)
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  This was followed by diagnostic mammogram and ultrasound which showed architectural distortion in the lateral left breast at the 3 o'clock position.  There were a group of cysts measuring 7 x 2 x 3 mm.  Stereotactic biopsy of the architectural distortion was recommended.  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     Final pathology showed 8 mm grade 1 tumor with negative margins. 5 Sentinel lymph nodes negative for malignancy.  Genetic testing negative.patient completed adjuvant radiation and started letrozole in sept 2023.  She did not require Oncotype testing  Baseline bone density scan does show osteopenia of the AP spine and right femur with a T-score of -2.  10-year probability of a major osteoporotic fracture 29.6% and hip fracture 2.8%.  Patient is on weekly Fosamax for the same   Interval history-patient has not been tolerating letrozole well.  She reports hot flashes as well as myalgias and diffuse arthralgias.  She has also been having significant reflux because of  Fosamax.  ECOG PS- 1 Pain scale- 0   Review of systems- Review of Systems  Constitutional:  Positive for malaise/fatigue. Negative for chills, fever and weight loss.  HENT:  Negative for congestion, ear discharge and nosebleeds.   Eyes:  Negative for blurred vision.  Respiratory:  Negative for cough, hemoptysis, sputum production, shortness of breath and wheezing.   Cardiovascular:  Negative for chest pain, palpitations, orthopnea and claudication.  Gastrointestinal:  Negative for abdominal pain, blood in stool, constipation, diarrhea, heartburn, melena, nausea and vomiting.  Genitourinary:  Negative for dysuria, flank pain, frequency, hematuria and urgency.  Musculoskeletal:  Positive for joint pain. Negative for back pain and myalgias.  Skin:  Negative for rash.  Neurological:  Negative for dizziness, tingling, focal weakness, seizures, weakness and headaches.  Endo/Heme/Allergies:  Does not bruise/bleed easily.  Psychiatric/Behavioral:  Negative for depression and suicidal ideas. The patient does not have insomnia.       No Known Allergies   Past Medical History:  Diagnosis Date   Anemia    Breast cancer ([place])    Depression    Dysrhythmia    racing heart- neg workup   GERD (gastroesophageal reflux disease)    History of PCOS    Hypertension    Kidney disease    44%  function   Pre-diabetes    Sleep apnea    inconclusive sleep study     Past Surgical History:  Procedure Laterality Date   ABDOMINAL HYSTERECTOMY     AXILLARY SENTINEL NODE BIOPSY Left 12/23/2021   Procedure: AXILLARY SENTINEL NODE BIOPSY;  Surgeon: Robert Bellow, MD;  Location: [facility] ORS;  Service: General;  Laterality:  Left;   BREAST BIOPSY Left 11/18/2021   lt br stereo, distortion, x clip, [facility]   BREAST LUMPECTOMY Left 12/23/2021   [facility]   BREAST LUMPECTOMY WITH NEEDLE LOCALIZATION Left 12/23/2021   Procedure: BREAST LUMPECTOMY WITH NEEDLE LOCALIZATION;  Surgeon: Robert Bellow, MD;   Location: [facility] ORS;  Service: General;  Laterality: Left;   CESAREAN SECTION     DIAGNOSTIC LAPAROSCOPY     DILATION AND CURETTAGE OF UTERUS  06/30/2009   with hysteroecopy   TUBAL LIGATION Bilateral     Social History   Socioeconomic History   Marital status: Married    Spouse name: Not on file   Number of children: 2   Years of education: Not on file   Highest education level: Not on file  Occupational History    Employer: [employer]  Tobacco Use   Smoking status: Never   Smokeless tobacco: Never  Vaping Use   Vaping Use: Never used  Substance and Sexual Activity   Alcohol use: Not Currently   Drug use: No   Sexual activity: Yes    Partners: Male  Other Topics Concern   Not on file  Social History Narrative   Not on file   Social Determinants of Health   Financial Resource Strain: Not on file  Food Insecurity: Not on file  Transportation Needs: Not on file  Physical Activity: Not on file  Stress: Not on file  Social Connections: Not on file  Intimate Partner Violence: Not on file    Family History  Problem Relation Age of Onset   Breast cancer Mother 38   Osteoporosis Mother    Diabetes Father    Hypertension Father    Cancer Brother        cholangiocarcinoma   Breast cancer Maternal Aunt 78   Pancreatic cancer Maternal Uncle    Lung cancer Maternal Grandfather    Pancreatic cancer Paternal Grandmother    Cancer - Other Paternal Grandmother        cholangiocarcinoma     Current Outpatient Medications:    alendronate (FOSAMAX) 70 MG tablet, Take 1 tablet (70 mg total) by mouth once a week. Take with a full glass of water on an empty stomach., Disp: 24 tablet, Rfl: 0   Cholecalciferol (VITAMIN D-3) 25 MCG (1000 UT) CAPS, Take 1,000 Units by mouth 2 (two) times daily., Disp: , Rfl:    cyanocobalamin 1000 MCG tablet, Take 1,000 mcg by mouth daily., Disp: , Rfl:    doxylamine, Sleep, (UNISOM) 25 MG tablet, Take 25 mg by mouth at bedtime as  needed. PRN, Disp: , Rfl:    famotidine (PEPCID) 20 MG tablet, Take 20 mg by mouth daily as needed for heartburn or indigestion., Disp: , Rfl:    letrozole (FEMARA) 2.5 MG tablet, TAKE 1 TABLET BY MOUTH DAILY, Disp: 90 tablet, Rfl: 0   losartan-hydrochlorothiazide (HYZAAR) 50-12.5 MG tablet, TAKE ONE TABLET BY MOUTH DAILY, Disp: 90 tablet, Rfl: 3  Physical exam:  Vitals:   07/23/22 1024  Weight: 236 lb (107 kg)   Physical Exam Cardiovascular:     Rate and Rhythm: Normal rate and regular rhythm.     Heart sounds: Normal heart sounds.  Pulmonary:     Effort: Pulmonary effort is normal.     Breath sounds: Normal breath sounds.  Skin:    General: Skin is warm and dry.  Neurological:     Mental Status: She is alert and oriented to person, place, and  time.    Breast exam was performed in seated and lying down position. Patient is status post left lumpectomy with a well-healed surgical scar. No evidence of any palpable masses. No evidence of axillary adenopathy. No evidence of any palpable masses or lumps in the right breast. No evidence of right axillary adenopathy      Latest Ref Rng & Units 04/11/2022   10:06 AM  CMP  Glucose 70 - 99 mg/dL 136   BUN 8 - 23 mg/dL 22   Creatinine 0.44 - 1.00 mg/dL 1.62   Sodium 135 - 145 mmol/L 139   Potassium 3.5 - 5.1 mmol/L 3.7   Chloride 98 - 111 mmol/L 106   CO2 22 - 32 mmol/L 23   Calcium 8.9 - 10.3 mg/dL 9.0   Total Protein 6.5 - 8.1 g/dL 6.9   Total Bilirubin 0.3 - 1.2 mg/dL 0.7   Alkaline Phos 38 - 126 U/L 49   AST 15 - 41 U/L 41   ALT 0 - 44 U/L 40       Latest Ref Rng & Units 02/20/2022    8:22 AM  CBC  WBC 4.0 - 10.5 K/uL 5.7   Hemoglobin 12.0 - 15.0 g/dL 12.1   Hematocrit 36.0 - 46.0 % 35.3   Platelets 150 - 400 K/uL 212      Assessment and plan- Patient is a 67 y.o. 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  She is status  postlumpectomy and adjuvant radiation therapy.  She is here for a routine follow-up visit  Patient has not been tolerating letrozole well due to significant myalgias and arthralgias.  I have asked her to take a break from letrozole for 3 weeks and see how she feels.  If she feels better we could consider using alternative AI such as Aromasin or Arimidex.  If she is not able to tolerate AI as a category at all we can switch her to tamoxifen.  We do not have any reduced dosing available for AI.  Reflux: Likely made worse due to Fosamax.  Also her creatinine has been borderline with a GFR of around 35.  I therefore asked her to hold off on taking Fosamax at this time and we will continue to monitor her bone density scan closely.  I will see her back in 4 months without labs and she will let us know how she is doing after she comes off letrozole.  She will need a bilateral diagnostic mammogram in April 2024.   Visit Diagnosis 1. Malignant neoplasm of upper-outer quadrant of left breast in female, estrogen receptor positive ([place])   2. Use of letrozole (Femara)   3. History of ongoing treatment with alendronate (Fosamax)      Dr. Randa Evens, MD, MPH [facility] [phone] 07/23/2022 10:35 AM

## 2022-07-23 NOTE — Progress Notes (Signed)
Survivorship Care Plan visit completed.  Treatment summary reviewed and given to patient.  ASCO answers booklet reviewed and given to patient.  CARE program and Cancer Transitions discussed with patient along with other resources cancer center offers to patients and caregivers.  Patient verbalized understanding. 

## 2022-08-18 DIAGNOSIS — E1159 Type 2 diabetes mellitus with other circulatory complications: Secondary | ICD-10-CM | POA: Insufficient documentation

## 2022-08-18 DIAGNOSIS — I10 Essential (primary) hypertension: Secondary | ICD-10-CM | POA: Insufficient documentation

## 2022-08-19 ENCOUNTER — Encounter: Payer: Self-pay | Admitting: Physician Assistant

## 2022-08-19 ENCOUNTER — Ambulatory Visit (INDEPENDENT_AMBULATORY_CARE_PROVIDER_SITE_OTHER): Payer: Medicare Other | Admitting: Physician Assistant

## 2022-08-19 VITALS — BP 127/82 | HR 78 | Temp 97.9°F | Wt 239.9 lb

## 2022-08-19 DIAGNOSIS — J209 Acute bronchitis, unspecified: Secondary | ICD-10-CM | POA: Diagnosis not present

## 2022-08-19 MED ORDER — AZITHROMYCIN 250 MG PO TABS: ORAL_TABLET | ORAL | 0 refills | Status: AC

## 2022-08-19 MED ORDER — PREDNISONE 20 MG PO TABS: 20.0000 mg | ORAL_TABLET | Freq: Every day | ORAL | 0 refills | Status: DC

## 2022-08-19 NOTE — Progress Notes (Signed)
I,Connie R Striblin,acting as a Education administrator for [employer], PA-C.,have documented all relevant documentation on the behalf of Kathy Kirschner, PA-C,as directed by  Kathy Kirschner, PA-C while in the presence of Kathy Kirschner, PA-C.   Established patient visit   Patient: Kathy Farmer   DOB: October 27, 1955   67 y.o. Female  MRN: [id number] Visit Date: 08/19/2022  Today's healthcare provider: [name], PA-C   Cc. Cough x 1 week  Subjective    HPI  Cough: Patient complains of cough. Symptoms began 1 week ago. Cough described as productive of clear sputum, worsening over time. Patient denies chills and fever. Associated symptoms include headache - constant and dull  and SOB . Current treatments have included  OTC cough medication , with poor improvement.  Patient denies have tobacco smoke exposure.  Has had terrible cough, headache, for over a week and rest, fluids and cough medicine is not working.  Negative Covid test, no known exposure to Covid or the Flu Reports wheezing at night. Reports Tmax 100.5  Medications: Outpatient Medications Prior to Visit  Medication Sig   Cholecalciferol (VITAMIN D-3) 25 MCG (1000 UT) CAPS Take 1,000 Units by mouth 2 (two) times daily.   cyanocobalamin 1000 MCG tablet Take 1,000 mcg by mouth daily.   doxylamine, Sleep, (UNISOM) 25 MG tablet Take 25 mg by mouth at bedtime as needed. PRN   famotidine (PEPCID) 20 MG tablet Take 20 mg by mouth daily as needed for heartburn or indigestion.   letrozole (FEMARA) 2.5 MG tablet TAKE 1 TABLET BY MOUTH DAILY   losartan-hydrochlorothiazide (HYZAAR) 50-12.5 MG tablet TAKE ONE TABLET BY MOUTH DAILY   alendronate (FOSAMAX) 70 MG tablet Take 1 tablet (70 mg total) by mouth once a week. Take with a full glass of water on an empty stomach. (Patient not taking: Reported on 08/19/2022)   No facility-administered medications prior to visit.    Review of Systems  Constitutional:  Positive for fatigue.  Negative for fever.  HENT:  Positive for congestion.   Respiratory:  Positive for cough and wheezing. Negative for shortness of breath.   Cardiovascular:  Negative for chest pain and leg swelling.  Gastrointestinal:  Negative for abdominal pain.  Neurological:  Negative for dizziness and headaches.      Objective    BP 127/82 (BP Location: Right Arm, Patient Position: Sitting, Cuff Size: Large)   Pulse 78   Temp 97.9 F (36.6 C) (Oral)   Wt 239 lb 14.4 oz (108.8 kg)   LMP 01/24/2011   SpO2 100%   BMI 34.42 kg/m  Blood pressure 127/82, pulse 78, temperature 97.9 F (36.6 C), temperature source Oral, weight 239 lb 14.4 oz (108.8 kg), last menstrual period 01/24/2011, SpO2 100 %.   Physical Exam Constitutional:      General: She is awake.     Appearance: She is well-developed.  HENT:     Head: Normocephalic.  Eyes:     Conjunctiva/sclera: Conjunctivae normal.  Cardiovascular:     Rate and Rhythm: Normal rate and regular rhythm.     Heart sounds: Normal heart sounds.  Pulmonary:     Effort: Pulmonary effort is normal.     Breath sounds: Normal breath sounds. No wheezing or rales.  Musculoskeletal:     Right lower leg: No edema.     Left lower leg: No edema.  Skin:    General: Skin is warm.  Neurological:     Mental Status: She is alert and oriented to  person, place, and time.  Psychiatric:        Attention and Perception: Attention normal.        Mood and Affect: Mood normal.        Speech: Speech normal.        Behavior: Behavior is cooperative.     No results found for any visits on 08/19/22.  Assessment & Plan     Acute bronchitis Rx prednisone 20 mg x 5 dayys  Rx zpack x 5 days  Continue otc meds, increase fluids  Return if symptoms worsen or fail to improve.      I, Kathy Kirschner, PA-C have reviewed all documentation for this visit. The documentation on  08/19/22  for the exam, diagnosis, procedures, and orders are all accurate and complete.  Kathy Kirschner, PA-C [facility] [address] [place], [place], [address] Office: [phone] Fax: [place]

## 2022-08-26 ENCOUNTER — Other Ambulatory Visit: Payer: Self-pay | Admitting: *Deleted

## 2022-08-26 ENCOUNTER — Encounter: Payer: Self-pay | Admitting: Oncology

## 2022-08-26 MED ORDER — ANASTROZOLE 1 MG PO TABS: 1.0000 mg | ORAL_TABLET | Freq: Every day | ORAL | 1 refills | Status: DC

## 2022-09-16 LAB — HEMOGLOBIN A1C: Hemoglobin A1C: 6.5

## 2022-09-16 LAB — MICROALBUMIN / CREATININE URINE RATIO: Microalb Creat Ratio: 14.2

## 2022-09-16 LAB — PROTEIN / CREATININE RATIO, URINE: Albumin, U: 7.6

## 2022-10-08 ENCOUNTER — Other Ambulatory Visit: Payer: Self-pay | Admitting: Oncology

## 2022-10-15 ENCOUNTER — Ambulatory Visit
Admission: RE | Admit: 2022-10-15 | Discharge: 2022-10-15 | Disposition: A | Discharge status: Home / Self Care | Payer: Medicare Other | Source: Ambulatory Visit | Attending: Radiation Oncology | Admitting: Radiation Oncology

## 2022-10-15 ENCOUNTER — Encounter: Payer: Self-pay | Admitting: Radiation Oncology

## 2022-10-15 VITALS — BP 120/84 | HR 72 | Temp 97.6°F | Resp 16 | Ht 70.0 in | Wt 237.0 lb

## 2022-10-15 DIAGNOSIS — Z170 Estrogen receptor positive status [ER+]: Secondary | ICD-10-CM | POA: Diagnosis not present

## 2022-10-15 DIAGNOSIS — Z79811 Long term (current) use of aromatase inhibitors: Secondary | ICD-10-CM | POA: Diagnosis not present

## 2022-10-15 DIAGNOSIS — Z923 Personal history of irradiation: Secondary | ICD-10-CM | POA: Insufficient documentation

## 2022-10-15 DIAGNOSIS — C50412 Malignant neoplasm of upper-outer quadrant of left female breast: Secondary | ICD-10-CM | POA: Diagnosis present

## 2022-10-15 NOTE — Progress Notes (Signed)
Radiation Oncology Follow up Note  Name: Kathy Farmer   Date:   10/15/2022 MRN:  [id number] DOB: 1955/11/26    This 67 y.o. female presents to the clinic today for 44-month follow-up status post whole breast radiation to her left breast for stage Ia ER positive invasive mammary carcinoma.  REFERRING PROVIDER: Alfredia Ferguson, PA-C  HPI: Patient is a 67 year old female now out 7 months having completed whole breast radiation to her left breast for stage Ia ER positive invasive mammary carcinoma.  Seen today in routine follow-up she is doing well.  She specifically denies breast tenderness cough.  She is having some bone pain which she associates with her endocrine therapy.  She is currently on Arimidex.  She has mammogram scheduled for next month.  COMPLICATIONS OF TREATMENT: none  FOLLOW UP COMPLIANCE: keeps appointments   PHYSICAL EXAM:  BP 120/84   Pulse 72   Temp 97.6 F (36.4 C) (Tympanic)   Resp 16   Ht 5\' 10"  (1.778 m)   Wt 237 lb (107.5 kg)   LMP 01/24/2011   BMI 34.01 kg/m  Lungs are clear to A&P cardiac examination essentially unremarkable with regular rate and rhythm. No dominant mass or nodularity is noted in either breast in 2 positions examined. Incision is well-healed. No axillary or supraclavicular adenopathy is appreciated. Cosmetic result is excellent.  Well-developed well-nourished patient in NAD. HEENT reveals PERLA, EOMI, discs not visualized.  Oral cavity is clear. No oral mucosal lesions are identified. Neck is clear without evidence of cervical or supraclavicular adenopathy. Lungs are clear to A&P. Cardiac examination is essentially unremarkable with regular rate and rhythm without murmur rub or thrill. Abdomen is benign with no organomegaly or masses noted. Motor sensory and DTR levels are equal and symmetric in the upper and lower extremities. Cranial nerves II through XII are grossly intact. Proprioception is intact. No peripheral adenopathy or edema  is identified. No motor or sensory levels are noted. Crude visual fields are within normal range.  RADIOLOGY RESULTS: No current films were reviewed  PLAN: Present time patient is doing well 7 months out from whole breast radiation with no evidence of disease clinically doing well.  She continues on Arimidex without side effect.  I have asked to see her back in 6 months for follow-up and then will go to once year follow-up visits.  I have asked the patient to copy me on her recent mammogram when that is completed.  I would like to take this opportunity to thank you for allowing me to participate in the care of your patient.Carmina Miller, MD

## 2022-10-20 ENCOUNTER — Ambulatory Visit
Admission: RE | Admit: 2022-10-20 | Discharge: 2022-10-20 | Disposition: A | Discharge status: Home / Self Care | Payer: Medicare Other | Source: Ambulatory Visit | Attending: Oncology

## 2022-10-20 ENCOUNTER — Ambulatory Visit
Admission: RE | Admit: 2022-10-20 | Discharge: 2022-10-20 | Disposition: A | Discharge status: Home / Self Care | Payer: Medicare Other | Source: Ambulatory Visit | Attending: Oncology | Admitting: Oncology

## 2022-10-20 DIAGNOSIS — Z170 Estrogen receptor positive status [ER+]: Secondary | ICD-10-CM | POA: Diagnosis present

## 2022-10-20 DIAGNOSIS — C50412 Malignant neoplasm of upper-outer quadrant of left female breast: Secondary | ICD-10-CM

## 2022-10-20 HISTORY — DX: Personal history of irradiation: Z92.3

## 2022-10-23 ENCOUNTER — Other Ambulatory Visit: Payer: Self-pay | Admitting: Oncology

## 2022-11-17 ENCOUNTER — Inpatient Hospital Stay: Payer: Medicare Other | Attending: Oncology | Admitting: Oncology

## 2022-11-17 ENCOUNTER — Other Ambulatory Visit: Payer: Self-pay | Admitting: *Deleted

## 2022-11-17 ENCOUNTER — Encounter: Payer: Self-pay | Admitting: Oncology

## 2022-11-17 VITALS — BP 128/92 | HR 75 | Temp 97.6°F | Resp 18 | Ht 70.0 in | Wt 232.2 lb

## 2022-11-17 DIAGNOSIS — C50812 Malignant neoplasm of overlapping sites of left female breast: Secondary | ICD-10-CM | POA: Diagnosis not present

## 2022-11-17 DIAGNOSIS — M8589 Other specified disorders of bone density and structure, multiple sites: Secondary | ICD-10-CM | POA: Insufficient documentation

## 2022-11-17 DIAGNOSIS — M85851 Other specified disorders of bone density and structure, right thigh: Secondary | ICD-10-CM

## 2022-11-17 DIAGNOSIS — Z170 Estrogen receptor positive status [ER+]: Secondary | ICD-10-CM | POA: Insufficient documentation

## 2022-11-17 DIAGNOSIS — Z5181 Encounter for therapeutic drug level monitoring: Secondary | ICD-10-CM

## 2022-11-17 DIAGNOSIS — Z853 Personal history of malignant neoplasm of breast: Secondary | ICD-10-CM | POA: Diagnosis not present

## 2022-11-17 DIAGNOSIS — Z79811 Long term (current) use of aromatase inhibitors: Secondary | ICD-10-CM

## 2022-11-17 DIAGNOSIS — Z08 Encounter for follow-up examination after completed treatment for malignant neoplasm: Secondary | ICD-10-CM

## 2022-11-17 NOTE — Progress Notes (Signed)
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  This was followed by diagnostic mammogram and ultrasound which showed architectural distortion in the lateral left breast at the 3 o'clock position.  There were a group of cysts measuring 7 x 2 x 3 mm.  Stereotactic biopsy of the architectural distortion was recommended.  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     Final pathology showed 8 mm grade 1 tumor with negative margins. 5 Sentinel lymph nodes negative for malignancy.  Genetic testing negative.patient completed adjuvant radiation and started letrozole in sept 2023.  She did not require Oncotype testing.  Patient could not tolerate letrozole and was subsequently switched to Arimidex   Baseline bone density scan does show osteopenia of the AP spine and right femur with a T-score of -2.  10-year probability of a major osteoporotic fracture 29.6% and hip fracture 2.8%.  Patient is on weekly Fosamax for the same   Interval history-patient was switched from letrozole to Arimidex.  She still complains of morning stiffness  and some fatigue similar to letrozole but is willing to try Arimidex longer before considering another switch.  ECOG PS- 0 Pain scale- 0   Review of systems- Review of Systems  Constitutional:  Negative for chills, fever, malaise/fatigue and weight loss.  HENT:  Negative for congestion, ear discharge and nosebleeds.   Eyes:  Negative for blurred vision.  Respiratory:  Negative for cough, hemoptysis, sputum production, shortness of breath and wheezing.   Cardiovascular:  Negative for chest pain, palpitations, orthopnea and claudication.  Gastrointestinal:  Negative for abdominal pain, blood in stool, constipation, diarrhea, heartburn, melena, nausea and vomiting.  Genitourinary:  Negative for dysuria, flank pain, frequency, hematuria and urgency.  Musculoskeletal:  Negative for back pain, joint pain and myalgias.  Skin:  Negative for rash.  Neurological:  Negative for dizziness, tingling, focal weakness, seizures, weakness and headaches.  Endo/Heme/Allergies:  Does not bruise/bleed easily.  Psychiatric/Behavioral:  Negative for depression and suicidal ideas. The patient does not have insomnia.       No Known Allergies   Past Medical History:  Diagnosis Date   Anemia    Breast cancer (HCC) 2023   Depression    Dysrhythmia    racing heart- neg workup   GERD (gastroesophageal reflux disease)    History of PCOS    Hypertension    Kidney disease    44%  function   Personal history of radiation therapy    Pre-diabetes    Sleep apnea    inconclusive sleep study     Past Surgical History:  Procedure Laterality Date   ABDOMINAL HYSTERECTOMY     AXILLARY SENTINEL NODE BIOPSY Left 12/23/2021  Procedure: AXILLARY SENTINEL NODE BIOPSY;  Surgeon: Earline Mayotte, MD;  Location: [facility] ORS;  Service: General;  Laterality: Left;   BREAST BIOPSY Left 11/18/2021   lt br stereo, distortion, x clip, [facility]   BREAST LUMPECTOMY Left 12/23/2021   [facility]   BREAST LUMPECTOMY WITH NEEDLE  LOCALIZATION Left 12/23/2021   Procedure: BREAST LUMPECTOMY WITH NEEDLE LOCALIZATION;  Surgeon: Earline Mayotte, MD;  Location: [facility] ORS;  Service: General;  Laterality: Left;   CESAREAN SECTION     DIAGNOSTIC LAPAROSCOPY     DILATION AND CURETTAGE OF UTERUS  06/30/2009   with hysteroecopy   TUBAL LIGATION Bilateral     Social History   Socioeconomic History   Marital status: Married    Spouse name: Not on file   Number of children: 2   Years of education: Not on file   Highest education level: Not on file  Occupational History    Employer: [employer]  Tobacco Use   Smoking status: Never   Smokeless tobacco: Never  Vaping Use   Vaping Use: Never used  Substance and Sexual Activity   Alcohol use: Not Currently   Drug use: No   Sexual activity: Yes    Partners: Male  Other Topics Concern   Not on file  Social History Narrative   Not on file   Social Determinants of Health   Financial Resource Strain: Not on file  Food Insecurity: Not on file  Transportation Needs: Not on file  Physical Activity: Not on file  Stress: Not on file  Social Connections: Not on file  Intimate Partner Violence: Not on file    Family History  Problem Relation Age of Onset   Breast cancer Mother 21   Osteoporosis Mother    Diabetes Father    Hypertension Father    Cancer Brother        cholangiocarcinoma   Breast cancer Maternal Aunt 78   Pancreatic cancer Maternal Uncle    Lung cancer Maternal Grandfather    Pancreatic cancer Paternal Grandmother    Cancer - Other Paternal Grandmother        cholangiocarcinoma     Current Outpatient Medications:    anastrozole (ARIMIDEX) 1 MG tablet, TAKE 1 TABLET BY MOUTH DAILY, Disp: 30 tablet, Rfl: 0   Cholecalciferol (VITAMIN D-3) 25 MCG (1000 UT) CAPS, Take 1,000 Units by mouth 2 (two) times daily., Disp: , Rfl:    citalopram (CELEXA) 10 MG tablet, Take 1 tablet by mouth daily., Disp: , Rfl:    cyanocobalamin 1000 MCG  tablet, Take 1,000 mcg by mouth daily., Disp: , Rfl:    doxylamine, Sleep, (UNISOM) 25 MG tablet, Take 25 mg by mouth at bedtime as needed. PRN, Disp: , Rfl:    famotidine (PEPCID) 20 MG tablet, Take 20 mg by mouth daily as needed for heartburn or indigestion., Disp: , Rfl:    losartan-hydrochlorothiazide (HYZAAR) 50-12.5 MG tablet, TAKE ONE TABLET BY MOUTH DAILY, Disp: 90 tablet, Rfl: 3   OZEMPIC, 0.25 OR 0.5 MG/DOSE, 2 MG/3ML SOPN, Inject into the skin., Disp: , Rfl:    predniSONE (DELTASONE) 20 MG tablet, Take 1 tablet (20 mg total) by mouth daily with breakfast., Disp: 5 tablet, Rfl: 0  Physical exam:  Vitals:   11/17/22 1017 11/17/22 1023  BP: (!) 136/119 (!) 128/92  Pulse: 77 75  Resp: 18   Temp: 97.6 F (36.4 C)   TempSrc: Tympanic   SpO2: 96%   Weight: 232 lb 3.2  oz (105.3 kg)   Height: 5\' 10"  (1.778 m)    Physical Exam Cardiovascular:     Rate and Rhythm: Normal rate and regular rhythm.     Heart sounds: Normal heart sounds.  Pulmonary:     Effort: Pulmonary effort is normal.     Breath sounds: Normal breath sounds.  Abdominal:     General: Bowel sounds are normal.     Palpations: Abdomen is soft.  Skin:    General: Skin is warm and dry.  Neurological:     Mental Status: She is alert and oriented to person, place, and time.         Latest Ref Rng & Units 04/11/2022   10:06 AM  CMP  Glucose 70 - 99 mg/dL 161   BUN 8 - 23 mg/dL 22   Creatinine 0.96 - 1.00 mg/dL 0.45   Sodium 409 - 811 mmol/L 139   Potassium 3.5 - 5.1 mmol/L 3.7   Chloride 98 - 111 mmol/L 106   CO2 22 - 32 mmol/L 23   Calcium 8.9 - 10.3 mg/dL 9.0   Total Protein 6.5 - 8.1 g/dL 6.9   Total Bilirubin 0.3 - 1.2 mg/dL 0.7   Alkaline Phos 38 - 126 U/L 49   AST 15 - 41 U/L 41   ALT 0 - 44 U/L 40       Latest Ref Rng & Units 02/20/2022    8:22 AM  CBC  WBC 4.0 - 10.5 K/uL 5.7   Hemoglobin 12.0 - 15.0 g/dL 91.4   Hematocrit 78.2 - 46.0 % 35.3   Platelets 150 - 400 K/uL 212     No images  are attached to the encounter.  MM DIAG BREAST TOMO BILATERAL  Result Date: 10/20/2022 CLINICAL DATA:  Patient is status post left breast lumpectomy. EXAM: DIGITAL DIAGNOSTIC BILATERAL MAMMOGRAM WITH TOMOSYNTHESIS TECHNIQUE: Bilateral digital diagnostic mammography and breast tomosynthesis was performed. COMPARISON:  Previous exam(s). ACR Breast Density Category b: There are scattered areas of fibroglandular density. FINDINGS: Interval left breast lumpectomy changes. No new masses, calcifications or nonsurgical distortion identified in either breast. IMPRESSION: No mammographic evidence for malignancy. Interval left breast lumpectomy changes. RECOMMENDATION: Bilateral diagnostic mammography in 1 year. I have discussed the findings and recommendations with the patient. If applicable, a reminder letter will be sent to the patient regarding the next appointment. BI-RADS CATEGORY  2: Benign. Electronically Signed   By: Annia Belt M.D.   On: 10/20/2022 09:35    Assessment and plan- Patient is a 67 y.o. 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  She is status postlumpectomy and adjuvant radiation therapy.  She is currently on Arimidex and this is a routine follow-up visit  Clinically patient is doing well with no concerning signs and symptoms of recurrence based on today's exam.  She does report some morning stiffness with Arimidex but would like to continue with the same for now.  She will also take her calcium and vitamin D.  Patient does have significant osteopenia and her 10-year risk of major osteoporotic fracture was more than 20%.  She is unable to tolerate Fosamax due to reflux.  I discussed yearly Reclast with her.  Discussed risks and benefits of Reclast including all but not limited to osteonecrosis of the jaw and hypocalcemia.  We will obtain dental clearance prior and schedule her for yearly Reclast.  I  will see her back in  6 months no labs   Visit Diagnosis 1. Encounter for follow-up surveillance of breast cancer   2. Visit for monitoring Arimidex therapy   3. Osteopenia of neck of right femur      Dr. Owens Shark, MD, MPH [facility] [phone] 11/17/2022 1:09 PM

## 2022-11-17 NOTE — Progress Notes (Signed)
Dental clearance

## 2022-11-19 ENCOUNTER — Other Ambulatory Visit: Payer: Self-pay | Admitting: Oncology

## 2022-11-21 ENCOUNTER — Ambulatory Visit: Payer: BLUE CROSS/BLUE SHIELD | Admitting: Oncology

## 2022-11-28 ENCOUNTER — Telehealth: Payer: Self-pay | Admitting: Physician Assistant

## 2022-11-28 NOTE — Telephone Encounter (Signed)
Contacted Kathy Farmer to schedule their annual wellness visit. Welcome to Medicare visit Due by 08/29/2023.  Thank you,  [facility] Support [facility] Direct dial  [phone]

## 2022-12-22 ENCOUNTER — Other Ambulatory Visit: Payer: Self-pay | Admitting: Oncology

## 2022-12-26 ENCOUNTER — Ambulatory Visit (INDEPENDENT_AMBULATORY_CARE_PROVIDER_SITE_OTHER): Payer: Medicare Other | Admitting: Physician Assistant

## 2022-12-26 VITALS — BP 107/68 | HR 77

## 2022-12-26 DIAGNOSIS — E559 Vitamin D deficiency, unspecified: Secondary | ICD-10-CM

## 2022-12-26 DIAGNOSIS — I10 Essential (primary) hypertension: Secondary | ICD-10-CM

## 2022-12-26 DIAGNOSIS — E119 Type 2 diabetes mellitus without complications: Secondary | ICD-10-CM

## 2022-12-26 DIAGNOSIS — N183 Chronic kidney disease, stage 3 unspecified: Secondary | ICD-10-CM

## 2022-12-26 DIAGNOSIS — K219 Gastro-esophageal reflux disease without esophagitis: Secondary | ICD-10-CM

## 2022-12-26 DIAGNOSIS — E538 Deficiency of other specified B group vitamins: Secondary | ICD-10-CM

## 2022-12-26 DIAGNOSIS — I129 Hypertensive chronic kidney disease with stage 1 through stage 4 chronic kidney disease, or unspecified chronic kidney disease: Secondary | ICD-10-CM

## 2022-12-26 DIAGNOSIS — E669 Obesity, unspecified: Secondary | ICD-10-CM

## 2022-12-26 DIAGNOSIS — R7303 Prediabetes: Secondary | ICD-10-CM

## 2022-12-26 MED ORDER — SEMAGLUTIDE (1 MG/DOSE) 4 MG/3ML ~~LOC~~ SOPN
1.0000 mg | PEN_INJECTOR | SUBCUTANEOUS | 3 refills | Status: DC
Start: 2022-12-26 — End: 2023-05-08

## 2022-12-26 MED ORDER — LOSARTAN POTASSIUM-HCTZ 50-12.5 MG PO TABS: 1.0000 | ORAL_TABLET | Freq: Every day | ORAL | 3 refills | Status: DC

## 2022-12-26 NOTE — Progress Notes (Unsigned)
Established patient visit   Patient: Kathy Farmer   DOB: 12-05-1955   67 y.o. Female  MRN: [id number] Visit Date: 12/26/2022  Today's healthcare provider: Alfredia Ferguson, PA-C   Chief Complaint  Patient presents with   Medical Management of Chronic Issues    BP Check and patient would like to know the plan for the remainder of the year in regards to her chronic care needs.   Hypertension   Subjective    HPI Hypertension, follow-up  BP Readings from Last 3 Encounters:  12/26/22 107/68  11/17/22 (!) 128/92  10/15/22 120/84   Wt Readings from Last 3 Encounters:  11/17/22 232 lb 3.2 oz (105.3 kg)  10/15/22 237 lb (107.5 kg)  08/19/22 239 lb 14.4 oz (108.8 kg)     Pertinent labs Lab Results  Component Value Date   CHOL 181 11/15/2020   HDL 39 (L) 11/15/2020   LDLCALC 118 (H) 11/15/2020   TRIG 134 11/15/2020   CHOLHDL 4.6 (H) 11/15/2020   Lab Results  Component Value Date   NA 139 04/11/2022   K 3.7 04/11/2022   CREATININE 1.62 (H) 04/11/2022   GFRNONAA 35 (L) 04/11/2022   GLUCOSE 136 (H) 04/11/2022   TSH 1.480 11/15/2020     The 10-year ASCVD risk score (Arnett DK, et al., 2019) is: 12%   Diabetes Mellitus Type II, Follow-up  Lab Results  Component Value Date   HGBA1C 6.1 (H) 01/17/2022   HGBA1C 6.0 (H) 08/13/2021   HGBA1C 5.7 (H) 11/15/2020   Wt Readings from Last 3 Encounters:  11/17/22 232 lb 3.2 oz (105.3 kg)  10/15/22 237 lb (107.5 kg)  08/19/22 239 lb 14.4 oz (108.8 kg)   New dx 3/24. A1c 6.5%. Now managed on ozempic 0.5 mg for the last 2 months.   Pertinent Labs: Lab Results  Component Value Date   CHOL 181 11/15/2020   HDL 39 (L) 11/15/2020   LDLCALC 118 (H) 11/15/2020   TRIG 134 11/15/2020   CHOLHDL 4.6 (H) 11/15/2020   Lab Results  Component Value Date   NA 139 04/11/2022   K 3.7 04/11/2022   CREATININE 1.62 (H) 04/11/2022   GFRNONAA 35 (L) 04/11/2022   LABMICR 8.0 01/17/2022   MICRALBCREAT 4 01/17/2022      ---------------------------------------------------------------------------------------------------  ---------------------------------------------------------------------------------------------------  Medications: Outpatient Medications Prior to Visit  Medication Sig   anastrozole (ARIMIDEX) 1 MG tablet TAKE 1 TABLET BY MOUTH DAILY   Cholecalciferol (VITAMIN D-3) 25 MCG (1000 UT) CAPS Take 1,000 Units by mouth 2 (two) times daily.   citalopram (CELEXA) 10 MG tablet Take 1 tablet by mouth daily.   cyanocobalamin 1000 MCG tablet Take 1,000 mcg by mouth daily.   losartan-hydrochlorothiazide (HYZAAR) 50-12.5 MG tablet TAKE ONE TABLET BY MOUTH DAILY   OZEMPIC, 0.25 OR 0.5 MG/DOSE, 2 MG/3ML SOPN Inject into the skin.   doxylamine, Sleep, (UNISOM) 25 MG tablet Take 25 mg by mouth at bedtime as needed. PRN   famotidine (PEPCID) 20 MG tablet Take 20 mg by mouth daily as needed for heartburn or indigestion.   predniSONE (DELTASONE) 20 MG tablet Take 1 tablet (20 mg total) by mouth daily with breakfast.   No facility-administered medications prior to visit.    Review of Systems    Objective    BP 107/68 (BP Location: Right Arm, Patient Position: Sitting, Cuff Size: Large)   Pulse 77   LMP 01/24/2011   SpO2 98%    Physical Exam Vitals reviewed.  Constitutional:      Appearance: She is not ill-appearing.  HENT:     Head: Normocephalic.  Eyes:     Conjunctiva/sclera: Conjunctivae normal.  Cardiovascular:     Rate and Rhythm: Normal rate.  Pulmonary:     Effort: Pulmonary effort is normal. No respiratory distress.  Neurological:     General: No focal deficit present.     Mental Status: She is alert and oriented to person, place, and time.  Psychiatric:        Mood and Affect: Mood normal.        Behavior: Behavior normal.     ***  No results found for any visits on 12/26/22.  Assessment & Plan     ***  No follow-ups on file.      I, Alfredia Ferguson, PA-C have reviewed  all documentation for this visit. The documentation on  12/26/22   for the exam, diagnosis, procedures, and orders are all accurate and complete.  Alfredia Ferguson, PA-C [facility] [address] [place], [place], [address] Office: [phone] Fax: [phone]   [facility]

## 2022-12-27 LAB — HEMOGLOBIN A1C
Est. average glucose Bld gHb Est-mCnc: 126 mg/dL
Hgb A1c MFr Bld: 6 % — ABNORMAL HIGH (ref 4.8–5.6)

## 2022-12-30 ENCOUNTER — Encounter: Payer: Self-pay | Admitting: Physician Assistant

## 2022-12-30 DIAGNOSIS — E119 Type 2 diabetes mellitus without complications: Secondary | ICD-10-CM | POA: Insufficient documentation

## 2022-12-30 NOTE — Assessment & Plan Note (Signed)
Pt is now diabetic. A1c was > 6.5%. She was started on ozempic, titrated to 0.5 mg. Reports she has not lost weight yet.  Ordered A1c, okay to increase to 1 mg of ozempic. Can continue to titrate up for weight management while monitoring A1c.   Uacr utd. Needs foot exam. On acei. Consider statin.  F/u 4 mo

## 2022-12-30 NOTE — Assessment & Plan Note (Signed)
CKD stable. Follows with nephro.  Bp in appropriate range today. Cont losartan 50 mg hydrochlorothiazide 12.5 mg

## 2023-01-23 ENCOUNTER — Other Ambulatory Visit: Payer: Self-pay | Admitting: Medical Oncology

## 2023-01-23 MED ORDER — ANASTROZOLE 1 MG PO TABS: 1.0000 mg | ORAL_TABLET | Freq: Every day | ORAL | 1 refills | Status: DC

## 2023-02-03 ENCOUNTER — Other Ambulatory Visit: Payer: Self-pay | Admitting: Oncology

## 2023-02-08 IMAGING — MG MM BREAST LOCALIZATION CLIP
4 series · 4 of 12 positions shown · non-contrast
Comparison: Previous exam(s).

CLINICAL DATA: Status post 3D stereotactic guided core needle
biopsy of a small area of asymmetry with possible architectural
distortion in the posterior outer left breast.

EXAM:
3D DIAGNOSTIC LEFT MAMMOGRAM POST STEREOTACTIC BIOPSY

[L ML synth-2D]
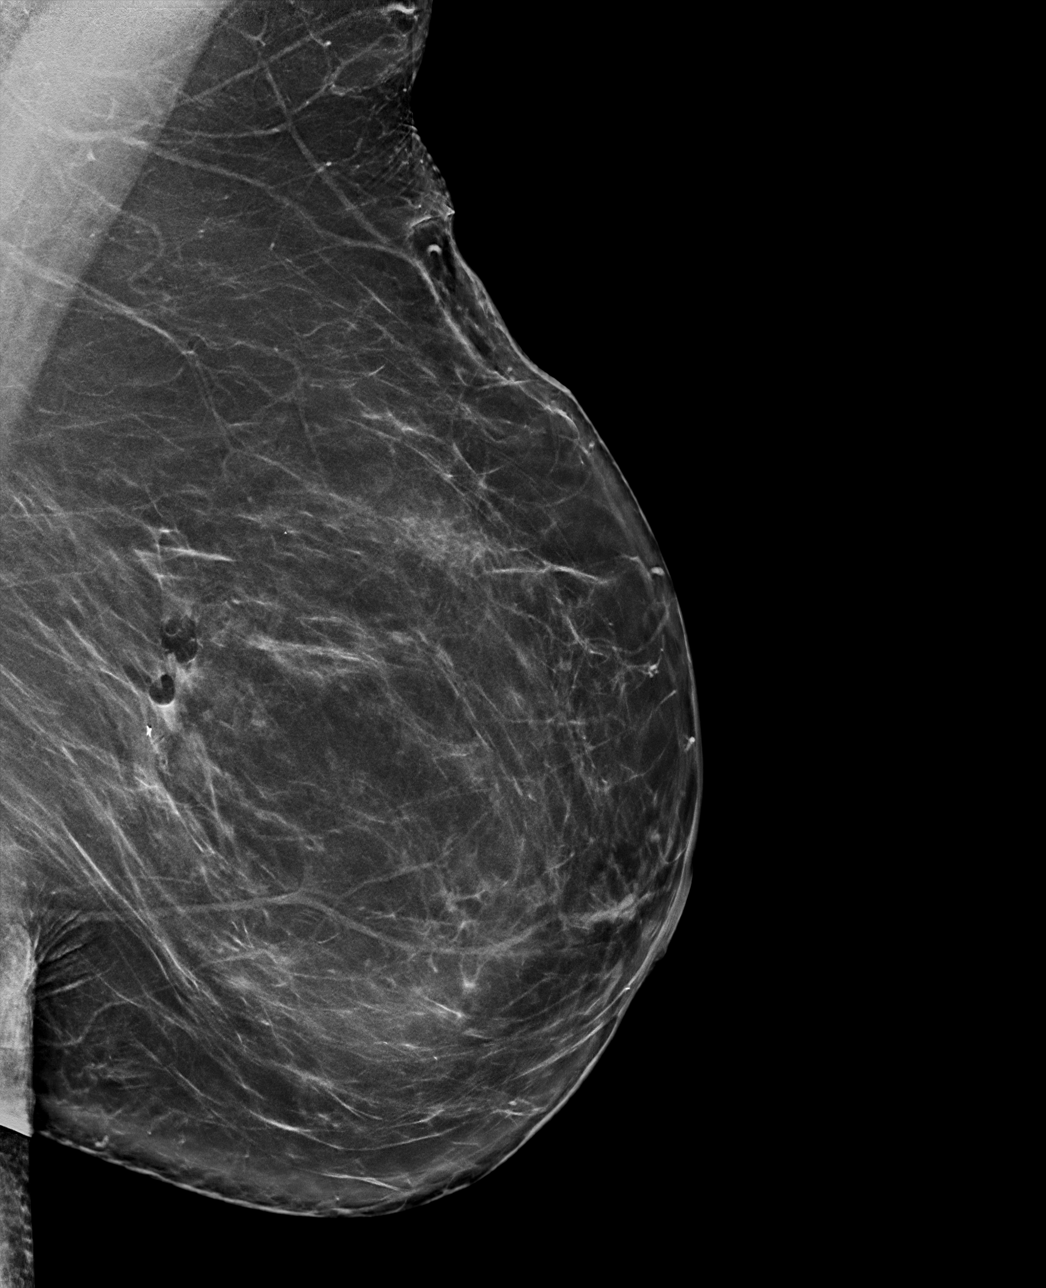

[L CC synth-2D]
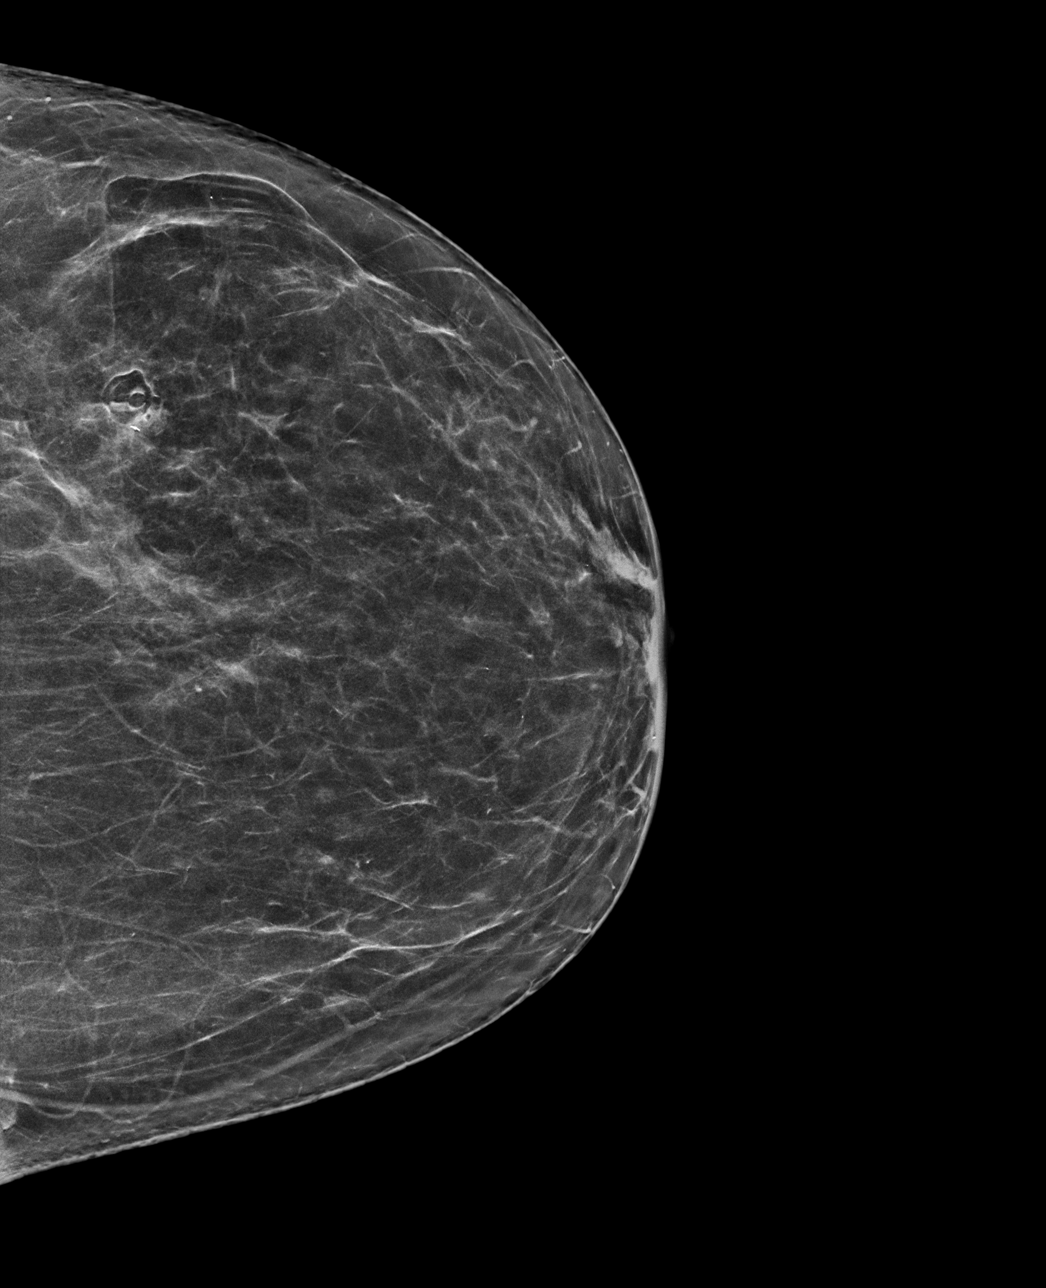

[L ML tomo · tomo slice 41/80.0]
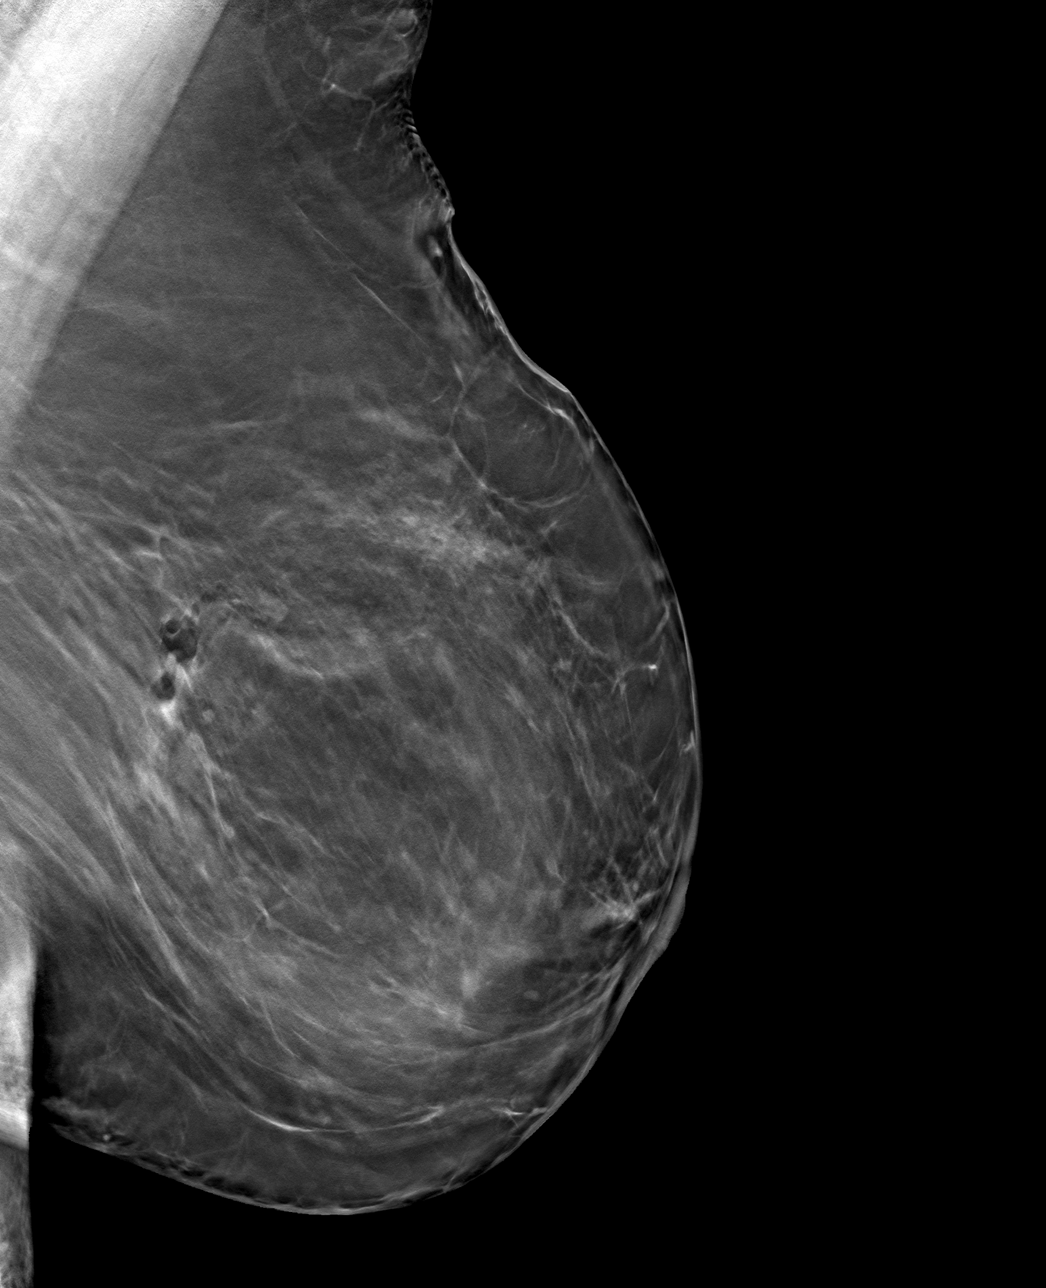

[L CC tomo · tomo slice 36/71.0]
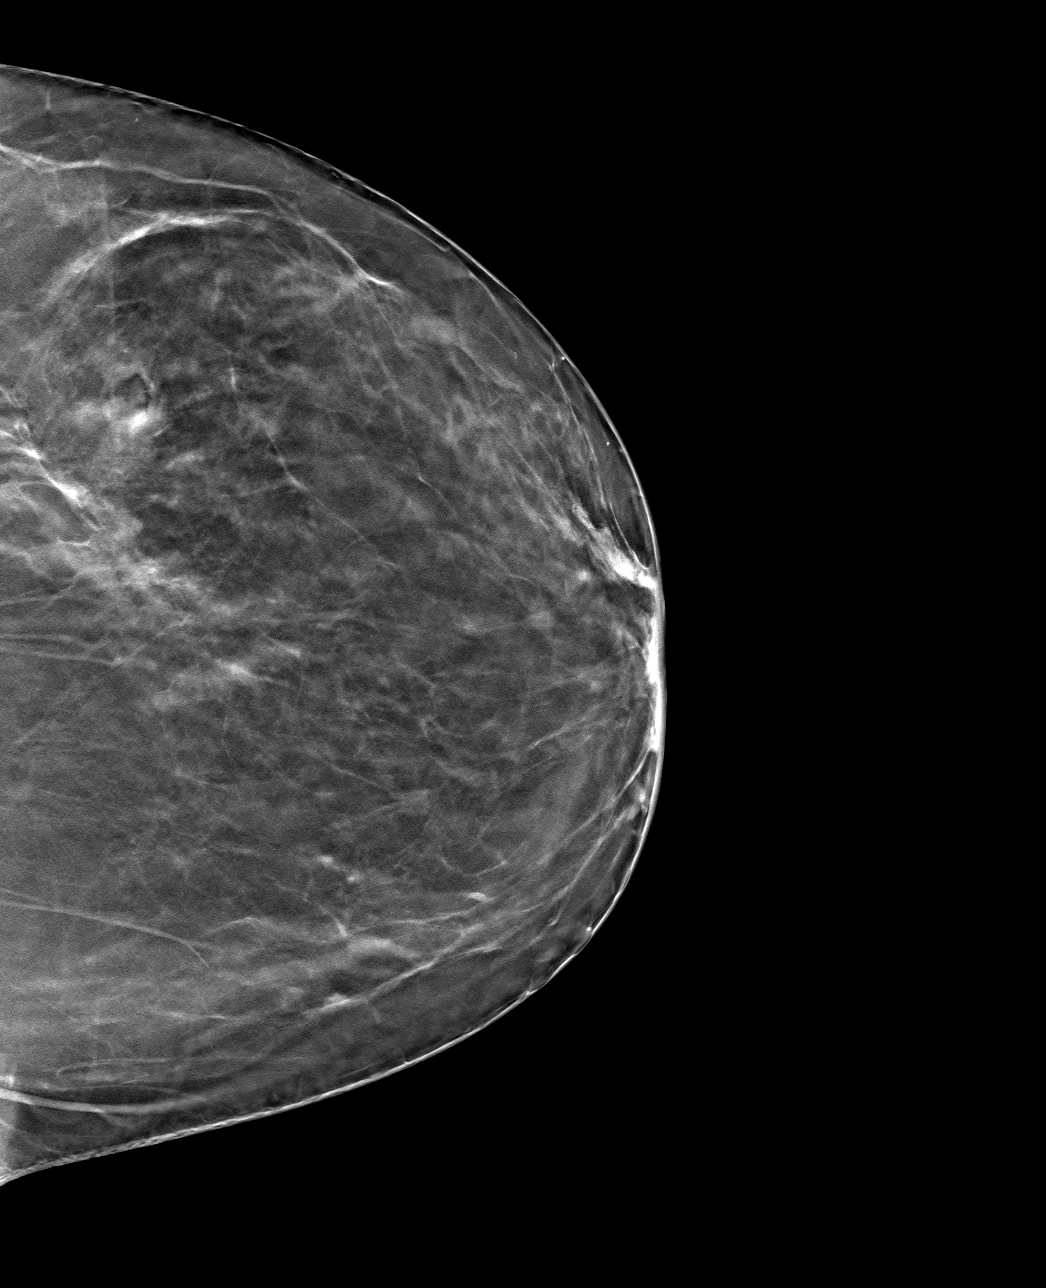

[4 of 12 positions shown; findings below may reference images not displayed]

FINDINGS: 3D Mammographic images were obtained following 3D stereotactic
guided biopsy of the recently demonstrated small area of asymmetry
with possible architectural distortion in the posterior outer left
breast. The biopsy marking clip is in expected position at the site
of biopsy.
IMPRESSION: Appropriate positioning of the X shaped biopsy marking clip at the
site of biopsy in the posterior outer left breast.

Final Assessment: Post Procedure Mammograms for Marker Placement

## 2023-02-15 ENCOUNTER — Other Ambulatory Visit: Payer: Self-pay | Admitting: *Deleted

## 2023-02-15 DIAGNOSIS — Z5181 Encounter for therapeutic drug level monitoring: Secondary | ICD-10-CM

## 2023-02-15 DIAGNOSIS — M85851 Other specified disorders of bone density and structure, right thigh: Secondary | ICD-10-CM

## 2023-02-15 DIAGNOSIS — C50412 Malignant neoplasm of upper-outer quadrant of left female breast: Secondary | ICD-10-CM

## 2023-04-16 ENCOUNTER — Ambulatory Visit
Admission: RE | Admit: 2023-04-16 | Discharge: 2023-04-16 | Disposition: A | Discharge status: Home / Self Care | Payer: Medicare Other | Source: Ambulatory Visit | Attending: Radiation Oncology | Admitting: Radiation Oncology

## 2023-04-16 ENCOUNTER — Encounter: Payer: Self-pay | Admitting: Radiation Oncology

## 2023-04-16 VITALS — BP 110/76 | HR 67 | Temp 97.2°F | Resp 12 | Ht 70.0 in | Wt 224.0 lb

## 2023-04-16 DIAGNOSIS — C50412 Malignant neoplasm of upper-outer quadrant of left female breast: Secondary | ICD-10-CM | POA: Insufficient documentation

## 2023-04-16 DIAGNOSIS — Z79811 Long term (current) use of aromatase inhibitors: Secondary | ICD-10-CM | POA: Diagnosis not present

## 2023-04-16 DIAGNOSIS — Z170 Estrogen receptor positive status [ER+]: Secondary | ICD-10-CM | POA: Diagnosis not present

## 2023-04-16 DIAGNOSIS — Z923 Personal history of irradiation: Secondary | ICD-10-CM | POA: Insufficient documentation

## 2023-04-16 NOTE — Progress Notes (Signed)
Radiation Oncology Follow up Note  Name: Kathy Farmer   Date:   04/16/2023 MRN:  [id number] DOB: [birth year]    This 67 y.o. female presents to the clinic today for 31-month follow-up status post whole breast radiation to her left breast for stage Ia ER positive invasive mammary carcinoma.  REFERRING PROVIDER: Alfredia Ferguson, PA-C  HPI: Patient is a 67 year old female now at 13 months having completed whole breast radiation to her left breast for stage Ia ER positive invasive mammary carcinoma.  Seen today in routine follow-up she is doing well.  Specifically denies any breast tenderness cough or bone pain..  She had a mammogram back in April which I have reviewed was BI-RADS 2 benign.  She is currently on Arimidex tolerating it well without side effect.  COMPLICATIONS OF TREATMENT: none  FOLLOW UP COMPLIANCE: keeps appointments   PHYSICAL EXAM:  BP 110/76   Pulse 67   Temp (!) 97.2 F (36.2 C) (Tympanic)   Resp 12   Ht 5\' 10"  (1.778 m)   Wt 224 lb (101.6 kg)   LMP 01/24/2011   BMI 32.14 kg/m  Lungs are clear to A&P cardiac examination essentially unremarkable with regular rate and rhythm. No dominant mass or nodularity is noted in either breast in 2 positions examined. Incision is well-healed. No axillary or supraclavicular adenopathy is appreciated. Cosmetic result is excellent.  Well-developed well-nourished patient in NAD. HEENT reveals PERLA, EOMI, discs not visualized.  Oral cavity is clear. No oral mucosal lesions are identified. Neck is clear without evidence of cervical or supraclavicular adenopathy. Lungs are clear to A&P. Cardiac examination is essentially unremarkable with regular rate and rhythm without murmur rub or thrill. Abdomen is benign with no organomegaly or masses noted. Motor sensory and DTR levels are equal and symmetric in the upper and lower extremities. Cranial nerves II through XII are grossly intact. Proprioception is intact. No peripheral  adenopathy or edema is identified. No motor or sensory levels are noted. Crude visual fields are within normal range.  RADIOLOGY RESULTS: Mammograms reviewed compatible with above-stated findings  PLAN: Present time patient is doing well with no evidence of disease.  Of asked to see her back in 1 year for follow-up.  Patient knows to call with any concerns.  I would like to take this opportunity to thank you for allowing me to participate in the care of your patient.Carmina Miller, MD

## 2023-04-27 ENCOUNTER — Ambulatory Visit: Payer: Medicare Other | Admitting: Family Medicine

## 2023-05-05 NOTE — Progress Notes (Unsigned)
      Established patient visit   Patient: Kathy Farmer   DOB: 11-23-1955   67 y.o. Female  MRN: [id number] Visit Date: 05/06/2023  Today's healthcare provider: Ronnald Ramp, MD   No chief complaint on file.  Subjective       Discussed the use of AI scribe software for clinical note transcription with the patient, who gave verbal consent to proceed.  History of Present Illness             Past Medical History:  Diagnosis Date   Anemia    Breast cancer (HCC) 2023   Depression    Dysrhythmia    racing heart- neg workup   GERD (gastroesophageal reflux disease)    History of PCOS    Hypertension    Kidney disease    44%  function   Personal history of radiation therapy    Pre-diabetes    Sleep apnea    inconclusive sleep study    Medications: Outpatient Medications Prior to Visit  Medication Sig   anastrozole (ARIMIDEX) 1 MG tablet Take 1 tablet (1 mg total) by mouth daily.   Cholecalciferol (VITAMIN D-3) 25 MCG (1000 UT) CAPS Take 1,000 Units by mouth 2 (two) times daily.   citalopram (CELEXA) 10 MG tablet Take 1 tablet by mouth daily.   cyanocobalamin 1000 MCG tablet Take 1,000 mcg by mouth daily.   losartan-hydrochlorothiazide (HYZAAR) 50-12.5 MG tablet Take 1 tablet by mouth daily.   Semaglutide, 1 MG/DOSE, 4 MG/3ML SOPN Inject 1 mg as directed once a week.   No facility-administered medications prior to visit.    Review of Systems  {Insert previous labs (optional):23779} {See past labs  Heme  Chem  Endocrine  Serology  Results Review (optional):1}   Objective    LMP 01/24/2011  {Insert last BP/Wt (optional):23777}{See vitals history (optional):1}      Physical Exam  ***  No results found for any visits on 05/06/23.  Assessment & Plan     Problem List Items Addressed This Visit       Cardiovascular and Mediastinum   Benign hypertension with CKD (chronic kidney disease) stage III (HCC)     Endocrine   Type  2 diabetes mellitus without complication, without long-term current use of insulin (HCC) - Primary    Assessment and Plan              No follow-ups on file.         Ronnald Ramp, MD  [facility] [phone] (phone) [phone] (fax)  [facility]

## 2023-05-06 ENCOUNTER — Encounter: Payer: Self-pay | Admitting: Family Medicine

## 2023-05-06 ENCOUNTER — Ambulatory Visit: Payer: Medicare Other | Admitting: Family Medicine

## 2023-05-06 VITALS — BP 124/79 | HR 73 | Ht 70.0 in | Wt 221.0 lb

## 2023-05-06 DIAGNOSIS — N183 Chronic kidney disease, stage 3 unspecified: Secondary | ICD-10-CM

## 2023-05-06 DIAGNOSIS — G4733 Obstructive sleep apnea (adult) (pediatric): Secondary | ICD-10-CM | POA: Diagnosis not present

## 2023-05-06 DIAGNOSIS — E119 Type 2 diabetes mellitus without complications: Secondary | ICD-10-CM

## 2023-05-06 DIAGNOSIS — K219 Gastro-esophageal reflux disease without esophagitis: Secondary | ICD-10-CM

## 2023-05-06 DIAGNOSIS — Z23 Encounter for immunization: Secondary | ICD-10-CM

## 2023-05-06 DIAGNOSIS — F324 Major depressive disorder, single episode, in partial remission: Secondary | ICD-10-CM

## 2023-05-06 DIAGNOSIS — I129 Hypertensive chronic kidney disease with stage 1 through stage 4 chronic kidney disease, or unspecified chronic kidney disease: Secondary | ICD-10-CM | POA: Diagnosis not present

## 2023-05-06 DIAGNOSIS — E1122 Type 2 diabetes mellitus with diabetic chronic kidney disease: Secondary | ICD-10-CM | POA: Diagnosis not present

## 2023-05-06 DIAGNOSIS — Z7984 Long term (current) use of oral hypoglycemic drugs: Secondary | ICD-10-CM

## 2023-05-06 LAB — POCT GLYCOSYLATED HEMOGLOBIN (HGB A1C): Hemoglobin A1C: 5.8 % — AB (ref 4.0–5.6)

## 2023-05-06 MED ORDER — CITALOPRAM HYDROBROMIDE 20 MG PO TABS
20.0000 mg | ORAL_TABLET | Freq: Every day | ORAL | 0 refills | Status: DC
Start: 2023-05-06 — End: 2023-08-05

## 2023-05-06 NOTE — Assessment & Plan Note (Signed)
Type 2 Diabetes Mellitus Well controlled with A1c of 5.8 on semaglutide 1mg  weekly and Farxiga 5mg  daily. Chronic, stable, A1c at goal  -Continue current regimen.

## 2023-05-06 NOTE — Assessment & Plan Note (Signed)
Chronic Kidney Disease (Stage 3) Recent eGFR of 43, improved from previous. Patient on Farxiga 5mg  daily for kidney protection. -Continue current regimen. -continue follow up with nephrology as scheduled

## 2023-05-06 NOTE — Assessment & Plan Note (Signed)
Depression Patient reports increased worry and sadness, particularly in relation to husband's chronic depression. Currently on Celexa 10mg  daily. Chronic,stable  -Increase Celexa to 20mg  daily. -continue regular follow up with therapist as scheduled  -Schedule virtual visit in 2 weeks to assess response to increased dosage.

## 2023-05-06 NOTE — Assessment & Plan Note (Signed)
Sleep Apnea Patient reports poor sleep quality, but unable to tolerate CPAP. -submitted referral to sleep medicine for alternative management options.

## 2023-05-06 NOTE — Assessment & Plan Note (Signed)
Gastroesophageal Reflux Disease Patient reports acid reflux symptoms, previously managed with omeprazole 40mg  twice daily, now discontinued due to kidney disease. -Consider referral to gastroenterology for further management options.

## 2023-05-08 ENCOUNTER — Other Ambulatory Visit: Payer: Self-pay | Admitting: Physician Assistant

## 2023-05-08 DIAGNOSIS — E119 Type 2 diabetes mellitus without complications: Secondary | ICD-10-CM

## 2023-05-14 ENCOUNTER — Encounter: Payer: Self-pay | Admitting: Primary Care

## 2023-05-20 ENCOUNTER — Encounter: Payer: Self-pay | Admitting: Oncology

## 2023-05-20 ENCOUNTER — Inpatient Hospital Stay: Payer: Medicare Other

## 2023-05-20 ENCOUNTER — Ambulatory Visit: Payer: Medicare Other

## 2023-05-20 ENCOUNTER — Inpatient Hospital Stay: Payer: Medicare Other | Attending: Oncology | Admitting: Oncology

## 2023-05-20 VITALS — BP 116/74 | HR 70 | Temp 96.8°F | Resp 18 | Wt 218.9 lb

## 2023-05-20 DIAGNOSIS — Z79811 Long term (current) use of aromatase inhibitors: Secondary | ICD-10-CM | POA: Diagnosis not present

## 2023-05-20 DIAGNOSIS — M85851 Other specified disorders of bone density and structure, right thigh: Secondary | ICD-10-CM

## 2023-05-20 DIAGNOSIS — C50412 Malignant neoplasm of upper-outer quadrant of left female breast: Secondary | ICD-10-CM | POA: Diagnosis present

## 2023-05-20 DIAGNOSIS — Z5181 Encounter for therapeutic drug level monitoring: Secondary | ICD-10-CM

## 2023-05-20 DIAGNOSIS — I129 Hypertensive chronic kidney disease with stage 1 through stage 4 chronic kidney disease, or unspecified chronic kidney disease: Secondary | ICD-10-CM | POA: Diagnosis not present

## 2023-05-20 DIAGNOSIS — N183 Chronic kidney disease, stage 3 unspecified: Secondary | ICD-10-CM | POA: Diagnosis not present

## 2023-05-20 DIAGNOSIS — Z170 Estrogen receptor positive status [ER+]: Secondary | ICD-10-CM | POA: Diagnosis not present

## 2023-05-20 DIAGNOSIS — M8589 Other specified disorders of bone density and structure, multiple sites: Secondary | ICD-10-CM | POA: Diagnosis not present

## 2023-05-20 LAB — COMPREHENSIVE METABOLIC PANEL
ALT: 26 U/L (ref 0–44)
AST: 26 U/L (ref 15–41)
Albumin: 4 g/dL (ref 3.5–5.0)
Alkaline Phosphatase: 68 U/L (ref 38–126)
Anion gap: 10 (ref 5–15)
BUN: 21 mg/dL (ref 8–23)
CO2: 28 mmol/L (ref 22–32)
Calcium: 9.4 mg/dL (ref 8.9–10.3)
Chloride: 103 mmol/L (ref 98–111)
Creatinine, Ser: 1.54 mg/dL — ABNORMAL HIGH (ref 0.44–1.00)
GFR, Estimated: 37 mL/min — ABNORMAL LOW (ref >60)
Glucose, Bld: 100 mg/dL — ABNORMAL HIGH (ref 70–99)
Potassium: 3.8 mmol/L (ref 3.5–5.1)
Sodium: 141 mmol/L (ref 135–145)
Total Bilirubin: 0.7 mg/dL (ref <1.2)
Total Protein: 7 g/dL (ref 6.5–8.1)

## 2023-05-20 NOTE — Progress Notes (Signed)
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  This was followed by diagnostic mammogram and ultrasound which showed architectural distortion in the lateral left breast at the 3 o'clock position.  There were a group of cysts measuring 7 x 2 x 3 mm.  Stereotactic biopsy of the architectural distortion was recommended.  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     Final pathology showed 8 mm grade 1 tumor with negative margins. 5 Sentinel lymph nodes negative for malignancy.  Genetic testing negative.patient completed adjuvant radiation and started letrozole in sept 2023.  She did not require Oncotype testing.  Patient could not tolerate letrozole and was subsequently switched to Arimidex   Baseline bone density scan does show osteopenia of the AP spine and right femur with a T-score of -2.  10-year probability of a major osteoporotic fracture 29.6% and hip fracture 2.8%.  Unable to tolerate Fosamax due to reflux  Interval history-patient is being seen by Dr. Thedore Mins for her stage III CKD.  She has been advised not to  take daily PPIs for her reflux which has been difficult for her to manage.  She takes occasional pantoprazole but feels that her symptoms are not well-controlled.  She does not have a GI appointment until March.  ECOG PS- 1 Pain scale- 0   Review of systems- Review of Systems  Constitutional:  Negative for chills, fever, malaise/fatigue and weight loss.  HENT:  Negative for congestion, ear discharge and nosebleeds.   Eyes:  Negative for blurred vision.  Respiratory:  Negative for cough, hemoptysis, sputum production, shortness of breath and wheezing.   Cardiovascular:  Negative for chest pain, palpitations, orthopnea and claudication.  Gastrointestinal:  Positive for heartburn. Negative for abdominal pain, blood in stool, constipation, diarrhea, melena, nausea and vomiting.  Genitourinary:  Negative for dysuria, flank pain, frequency, hematuria and urgency.  Musculoskeletal:  Negative for back pain, joint pain and myalgias.  Skin:  Negative for rash.  Neurological:  Negative for dizziness, tingling, focal weakness, seizures, weakness and headaches.  Endo/Heme/Allergies:  Does not bruise/bleed easily.  Psychiatric/Behavioral:  Negative for depression and suicidal ideas. The patient does not have insomnia.       No Known Allergies   Past Medical History:  Diagnosis Date   Anemia    Breast cancer (HCC) 2023   Depression    Dysrhythmia    racing heart- neg workup   GERD (gastroesophageal reflux disease)    History of PCOS    Hypertension    Kidney disease    44%  function   Personal history of radiation therapy    Pre-diabetes    Sleep apnea    inconclusive sleep study  Past Surgical History:  Procedure Laterality Date   ABDOMINAL HYSTERECTOMY     AXILLARY SENTINEL NODE BIOPSY Left 12/23/2021   Procedure: AXILLARY SENTINEL NODE BIOPSY;  Surgeon: Earline Mayotte, MD;  Location: [facility] ORS;  Service: General;  Laterality: Left;   BREAST BIOPSY Left 11/18/2021   lt br  stereo, distortion, x clip, [facility]   BREAST LUMPECTOMY Left 12/23/2021   [facility]   BREAST LUMPECTOMY WITH NEEDLE LOCALIZATION Left 12/23/2021   Procedure: BREAST LUMPECTOMY WITH NEEDLE LOCALIZATION;  Surgeon: Earline Mayotte, MD;  Location: [facility] ORS;  Service: General;  Laterality: Left;   CESAREAN SECTION     DIAGNOSTIC LAPAROSCOPY     DILATION AND CURETTAGE OF UTERUS  06/30/2009   with hysteroecopy   TUBAL LIGATION Bilateral     Social History   Socioeconomic History   Marital status: Married    Spouse name: Not on file   Number of children: 2   Years of education: Not on file   Highest education level: Master's degree (e.g., MA, MS, MEng, MEd, MSW, MBA)  Occupational History    Employer: [employer]  Tobacco Use   Smoking status: Never   Smokeless tobacco: Never  Vaping Use   Vaping status: Never Used  Substance and Sexual Activity   Alcohol use: Not Currently   Drug use: No   Sexual activity: Yes    Partners: Male  Other Topics Concern   Not on file  Social History Narrative   Not on file   Social Determinants of Health   Financial Resource Strain: Low Risk  (05/05/2023)   Overall Financial Resource Strain (CARDIA)    Difficulty of Paying Living Expenses: Not hard at all  Food Insecurity: No Food Insecurity (05/05/2023)   Hunger Vital Sign    Worried About Running Out of Food in the Last Year: Never true    Ran Out of Food in the Last Year: Never true  Transportation Needs: No Transportation Needs (05/05/2023)   PRAPARE - Administrator, Civil Service (Medical): No    Lack of Transportation (Non-Medical): No  Physical Activity: Insufficiently Active (05/05/2023)   Exercise Vital Sign    Days of Exercise per Week: 3 days    Minutes of Exercise per Session: 20 min  Stress: Stress Concern Present (05/05/2023)   [employer] of Occupational Health - Occupational Stress Questionnaire    Feeling of Stress : To some extent  Social Connections:  Moderately Integrated (05/05/2023)   Social Connection and Isolation Panel [NHANES]    Frequency of Communication with Friends and Family: Once a week    Frequency of Social Gatherings with Friends and Family: Once a week    Attends Religious Services: More than 4 times per year    Active Member of [employer] or Organizations: Yes    Attends Banker Meetings: 1 to 4 times per year    Marital Status: Married  Catering manager Violence: Not on file    Family History  Problem Relation Age of Onset   Breast cancer Mother 39   Osteoporosis Mother    Diabetes Father    Hypertension Father    Cancer Brother        cholangiocarcinoma   Breast cancer Maternal Aunt 78   Pancreatic cancer Maternal Uncle    Lung cancer Maternal Grandfather    Pancreatic cancer Paternal Grandmother    Cancer - Other Paternal Grandmother        cholangiocarcinoma  Current Outpatient Medications:    anastrozole (ARIMIDEX) 1 MG tablet, Take 1 tablet (1 mg total) by mouth daily., Disp: 90 tablet, Rfl: 1   citalopram (CELEXA) 20 MG tablet, Take 1 tablet (20 mg total) by mouth daily., Disp: 90 tablet, Rfl: 0   cyanocobalamin 1000 MCG tablet, Take 1,000 mcg by mouth daily., Disp: , Rfl:    FARXIGA 5 MG TABS tablet, Take 5 mg by mouth daily., Disp: , Rfl:    hydrochlorothiazide (HYDRODIURIL) 12.5 MG tablet, Take by mouth., Disp: , Rfl:    losartan (COZAAR) 25 MG tablet, Take 25 mg by mouth daily., Disp: , Rfl:    OZEMPIC, 1 MG/DOSE, 4 MG/3ML SOPN, DIAL AND INJECT UNDER THE SKIN 1 MG WEEKLY, Disp: 3 mL, Rfl: 3  Physical exam:  Vitals:   05/20/23 1013  BP: 116/74  Pulse: 70  Resp: 18  Temp: (!) 96.8 F (36 C)  TempSrc: Tympanic  SpO2: 97%  Weight: 218 lb 14.4 oz (99.3 kg)   Physical Exam Cardiovascular:     Rate and Rhythm: Normal rate and regular rhythm.     Heart sounds: Normal heart sounds.  Pulmonary:     Effort: Pulmonary effort is normal.     Breath sounds: Normal breath sounds.   Skin:    General: Skin is warm and dry.  Neurological:     Mental Status: She is alert and oriented to person, place, and time.    Breast exam was performed in seated and lying down position. Patient is status post left lumpectomy with a well-healed surgical scar. No evidence of any palpable masses. No evidence of axillary adenopathy. No evidence of any palpable masses or lumps in the right breast. No evidence of right axillary adenopathy      Latest Ref Rng & Units 05/20/2023    9:34 AM  CMP  Glucose 70 - 99 mg/dL 621   BUN 8 - 23 mg/dL 21   Creatinine 3.08 - 1.00 mg/dL 6.57   Sodium 846 - 962 mmol/L 141   Potassium 3.5 - 5.1 mmol/L 3.8   Chloride 98 - 111 mmol/L 103   CO2 22 - 32 mmol/L 28   Calcium 8.9 - 10.3 mg/dL 9.4   Total Protein 6.5 - 8.1 g/dL 7.0   Total Bilirubin <9.5 mg/dL 0.7   Alkaline Phos 38 - 126 U/L 68   AST 15 - 41 U/L 26   ALT 0 - 44 U/L 26       Latest Ref Rng & Units 02/20/2022    8:22 AM  CBC  WBC 4.0 - 10.5 K/uL 5.7   Hemoglobin 12.0 - 15.0 g/dL 28.4   Hematocrit 13.2 - 46.0 % 35.3   Platelets 150 - 400 K/uL 212      Assessment and plan- Patient is a 67 y.o. 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  She is status postlumpectomy and adjuvant radiation therapy.  This is a routine follow-up visit for breast cancer  Clinically patient is doing well with no concerning signs and symptoms of recurrence based on today's exam.Her last bone density scan from August 2023 did show osteopenia with a 10-year probability of a major osteoporotic fracture at 29.6% and hip fracture at 2.8%.  This would be considered significant osteopenia and previously patient was on Fosamax but she could not tolerated due to reflux.  We had planned to initially give her yearly Reclast  but she has stage III CKD and her GFR has been around 35.  Reclast would be contraindicated when GFR is less  than 35 and therefore I am holding off on giving that to her today.  Plan is to repeat her bone density scan in 6 months and if there is any further worsening of her bone density scores we will consider switching her to tamoxifen at that time.  She will continue with Arimidex at this time.  I will also schedule her mammogram for April 2025   Visit Diagnosis 1. Malignant neoplasm of upper-outer quadrant of left breast in female, estrogen receptor positive (HCC)   2. Osteopenia of neck of right femur   3. Visit for monitoring Arimidex therapy      Dr. Owens Shark, MD, MPH [facility] [phone] 05/20/2023 1:03 PM

## 2023-07-02 ENCOUNTER — Telehealth: Payer: Self-pay | Admitting: Family Medicine

## 2023-07-02 ENCOUNTER — Institutional Professional Consult (permissible substitution): Payer: BLUE CROSS/BLUE SHIELD | Admitting: Nurse Practitioner

## 2023-07-02 NOTE — Telephone Encounter (Signed)
 Received a fax from covermymeds for Ozempic 4mg /42ml  Key:  [id number]

## 2023-07-07 ENCOUNTER — Telehealth: Payer: Self-pay | Admitting: Family Medicine

## 2023-07-07 ENCOUNTER — Ambulatory Visit: Payer: Medicare Other

## 2023-07-07 NOTE — Telephone Encounter (Signed)
 Duplicate encouter. Will close and continue documentation in original encounter.

## 2023-07-07 NOTE — Telephone Encounter (Signed)
 Covermymeds is requesting prior authorization Key: [id number] Ozempic 1mg /dose

## 2023-07-08 ENCOUNTER — Ambulatory Visit: Payer: Self-pay

## 2023-07-08 NOTE — Telephone Encounter (Signed)
 Summary: Approval for medicine   Irving Burton with [employer] stated that she has an approval for ozempic 1 mg this approval is good from 07/08/2023-07/07/2024. She will fax it over.

## 2023-07-09 NOTE — Telephone Encounter (Signed)
 Approved.

## 2023-07-29 ENCOUNTER — Other Ambulatory Visit: Payer: Self-pay

## 2023-07-29 MED ORDER — ANASTROZOLE 1 MG PO TABS: 1.0000 mg | ORAL_TABLET | Freq: Every day | ORAL | 1 refills | Status: DC

## 2023-08-05 ENCOUNTER — Other Ambulatory Visit: Payer: Self-pay | Admitting: Family Medicine

## 2023-08-05 DIAGNOSIS — F324 Major depressive disorder, single episode, in partial remission: Secondary | ICD-10-CM

## 2023-08-31 ENCOUNTER — Telehealth: Payer: Self-pay | Admitting: Family Medicine

## 2023-08-31 DIAGNOSIS — E119 Type 2 diabetes mellitus without complications: Secondary | ICD-10-CM

## 2023-08-31 NOTE — Telephone Encounter (Signed)
 [facility] faxed refill request for the following medications:   OZEMPIC, 1 MG/DOSE, 4 MG/3ML SOPN      Please advise.

## 2023-09-03 MED ORDER — OZEMPIC (1 MG/DOSE) 4 MG/3ML ~~LOC~~ SOPN: 1.0000 mg | PEN_INJECTOR | SUBCUTANEOUS | 3 refills | Status: DC

## 2023-09-03 NOTE — Telephone Encounter (Signed)
 LOV 11*6*24 NOV 4*7*25 LRF [id number] [id number]

## 2023-10-05 ENCOUNTER — Encounter: Payer: Self-pay | Admitting: Family Medicine

## 2023-10-05 ENCOUNTER — Ambulatory Visit (INDEPENDENT_AMBULATORY_CARE_PROVIDER_SITE_OTHER): Payer: Self-pay | Admitting: Family Medicine

## 2023-10-05 VITALS — BP 123/79 | HR 66 | Ht 70.0 in | Wt 207.0 lb

## 2023-10-05 DIAGNOSIS — Z0001 Encounter for general adult medical examination with abnormal findings: Secondary | ICD-10-CM | POA: Diagnosis not present

## 2023-10-05 DIAGNOSIS — Z7984 Long term (current) use of oral hypoglycemic drugs: Secondary | ICD-10-CM

## 2023-10-05 DIAGNOSIS — E538 Deficiency of other specified B group vitamins: Secondary | ICD-10-CM

## 2023-10-05 DIAGNOSIS — Z0000 Encounter for general adult medical examination without abnormal findings: Secondary | ICD-10-CM

## 2023-10-05 DIAGNOSIS — E785 Hyperlipidemia, unspecified: Secondary | ICD-10-CM

## 2023-10-05 DIAGNOSIS — E559 Vitamin D deficiency, unspecified: Secondary | ICD-10-CM

## 2023-10-05 DIAGNOSIS — E669 Obesity, unspecified: Secondary | ICD-10-CM

## 2023-10-05 DIAGNOSIS — E1169 Type 2 diabetes mellitus with other specified complication: Secondary | ICD-10-CM | POA: Insufficient documentation

## 2023-10-05 DIAGNOSIS — M8589 Other specified disorders of bone density and structure, multiple sites: Secondary | ICD-10-CM | POA: Diagnosis not present

## 2023-10-05 DIAGNOSIS — I10 Essential (primary) hypertension: Secondary | ICD-10-CM | POA: Diagnosis not present

## 2023-10-05 DIAGNOSIS — E119 Type 2 diabetes mellitus without complications: Secondary | ICD-10-CM

## 2023-10-05 DIAGNOSIS — M792 Neuralgia and neuritis, unspecified: Secondary | ICD-10-CM | POA: Insufficient documentation

## 2023-10-05 NOTE — Progress Notes (Signed)
 Subjective:   Kathy Farmer is a 68 y.o. female who presents for Medicare Annual (Subsequent) preventive examination.  Visit Complete: In person  Patient Medicare AWV questionnaire was completed by the patient on 10/05/23; I have confirmed that all information answered by patient is correct and no changes since this date.  Discussed the use of AI scribe software for clinical note transcription with the patient, who gave verbal consent to proceed.  History of Present Illness The patient is a 68 year old who presents for an annual wellness visit.  She experiences numbness in her right foot, which she attributes to a previous ankle surgery performed six or seven years ago. The numbness has been worsening and is now accompanied by sensations of a 'hot knife' in her foot or ankle, sometimes radiating up her leg. The pain is mostly constant now, whereas it used to be intermittent. Her right foot often feels very cold, unlike her left foot. No recent injuries to the area have occurred.  Her past medical history includes a colonoscopy for colon cancer screening in 2020, a bone density test in 2023, and a negative hepatitis C screening in 2014. She is scheduled for a mammogram in April 2025.  She is currently taking vitamin D supplements, as recommended by her nephrologist last year, but notes that it has not been rechecked since.    Cardiac Risk Factors include: diabetes mellitus;hypertension     Objective:    Today's Vitals   10/05/23 0928  BP: 123/79  Pulse: 66  SpO2: 97%  Weight: 207 lb (93.9 kg)  Height: 5\' 10"  (1.778 m)  PainSc: 0-No pain   Body mass index is 29.7 kg/m.     05/20/2023   10:05 AM 04/16/2023    9:23 AM 11/17/2022   10:25 AM 10/15/2022    9:41 AM 04/14/2022    8:58 AM 04/11/2022   10:22 AM 04/11/2022   10:17 AM  Advanced Directives  Does Patient Have a Medical Advance Directive? Yes Yes No Yes Yes Yes Yes  Type of Engineer, mining of [place];Living will Healthcare Power of [place];Living will  Healthcare Power of [place];Living will Healthcare Power of [place];Living will Healthcare Power of [place];Living will Healthcare Power of [place];Living will  Does patient want to make changes to medical advance directive? Yes (ED - Information included in AVS) No - Patient declined  No - Patient declined No - Patient declined No - Patient declined   Copy of Healthcare Power of Attorney in Chart? No - copy requested No - copy requested  No - copy requested No - copy requested No - copy requested   Would patient like information on creating a medical advance directive?   No - Patient declined        Current Medications (verified) Outpatient Encounter Medications as of 10/05/2023  Medication Sig   anastrozole (ARIMIDEX) 1 MG tablet Take 1 tablet (1 mg total) by mouth daily.   citalopram (CELEXA) 20 MG tablet TAKE 1 TABLET BY MOUTH DAILY   cyanocobalamin 1000 MCG tablet Take 1,000 mcg by mouth daily.   famotidine (PEPCID) 40 MG tablet Take 1 tablet by mouth at bedtime.   FARXIGA 5 MG TABS tablet Take 5 mg by mouth daily.   hydrochlorothiazide (HYDRODIURIL) 12.5 MG tablet Take by mouth.   losartan (COZAAR) 25 MG tablet Take 25 mg by mouth daily.   Semaglutide, 1 MG/DOSE, (OZEMPIC, 1 MG/DOSE,) 4 MG/3ML SOPN Inject 1 mg into the skin once a week.  anastrozole (ARIMIDEX) 1 MG tablet Take 1 tablet (1 mg total) by mouth daily. (Patient not taking: Reported on 10/05/2023)   No facility-administered encounter medications on file as of 10/05/2023.    Allergies (verified) Patient has no known allergies.   History: Past Medical History:  Diagnosis Date   Anemia    Breast cancer (HCC) 2023   Depression    Dysrhythmia    racing heart- neg workup   GERD (gastroesophageal reflux disease)    History of PCOS    Hypertension    Kidney disease    44%  function   Personal history of radiation therapy    Pre-diabetes    Sleep  apnea    inconclusive sleep study   Past Surgical History:  Procedure Laterality Date   ABDOMINAL HYSTERECTOMY     AXILLARY SENTINEL NODE BIOPSY Left 12/23/2021   Procedure: AXILLARY SENTINEL NODE BIOPSY;  Surgeon: Earline Mayotte, MD;  Location: [facility] ORS;  Service: General;  Laterality: Left;   BREAST BIOPSY Left 11/18/2021   lt br stereo, distortion, x clip, [facility]   BREAST LUMPECTOMY Left 12/23/2021   [facility]   BREAST LUMPECTOMY WITH NEEDLE LOCALIZATION Left 12/23/2021   Procedure: BREAST LUMPECTOMY WITH NEEDLE LOCALIZATION;  Surgeon: Earline Mayotte, MD;  Location: [facility] ORS;  Service: General;  Laterality: Left;   CESAREAN SECTION     DIAGNOSTIC LAPAROSCOPY     DILATION AND CURETTAGE OF UTERUS  06/30/2009   with hysteroecopy   TUBAL LIGATION Bilateral    Family History  Problem Relation Age of Onset   Breast cancer Mother 23   Osteoporosis Mother    Diabetes Father    Hypertension Father    Cancer Brother        cholangiocarcinoma   Breast cancer Maternal Aunt 78   Pancreatic cancer Maternal Uncle    Lung cancer Maternal Grandfather    Pancreatic cancer Paternal Grandmother    Cancer - Other Paternal Grandmother        cholangiocarcinoma   Social History   Socioeconomic History   Marital status: Married    Spouse name: Not on file   Number of children: 2   Years of education: Not on file   Highest education level: Master's degree (e.g., MA, MS, MEng, MEd, MSW, MBA)  Occupational History    Employer: [employer]  Tobacco Use   Smoking status: Never   Smokeless tobacco: Never  Vaping Use   Vaping status: Never Used  Substance and Sexual Activity   Alcohol use: Not Currently   Drug use: No   Sexual activity: Yes    Partners: Male  Other Topics Concern   Not on file  Social History Narrative   Not on file   Social Drivers of Health   Financial Resource Strain: Low Risk  (05/05/2023)   Overall Financial Resource Strain (CARDIA)    Difficulty of  Paying Living Expenses: Not hard at all  Food Insecurity: No Food Insecurity (05/05/2023)   Hunger Vital Sign    Worried About Running Out of Food in the Last Year: Never true    Ran Out of Food in the Last Year: Never true  Transportation Needs: No Transportation Needs (05/05/2023)   PRAPARE - Administrator, Civil Service (Medical): No    Lack of Transportation (Non-Medical): No  Physical Activity: Insufficiently Active (05/05/2023)   Exercise Vital Sign    Days of Exercise per Week: 3 days    Minutes of Exercise  per Session: 20 min  Stress: Stress Concern Present (05/05/2023)   [employer] of Occupational Health - Occupational Stress Questionnaire    Feeling of Stress : To some extent  Social Connections: Moderately Integrated (05/05/2023)   Social Connection and Isolation Panel [NHANES]    Frequency of Communication with Friends and Family: Once a week    Frequency of Social Gatherings with Friends and Family: Once a week    Attends Religious Services: More than 4 times per year    Active Member of [employer] or Organizations: Yes    Attends Banker Meetings: 1 to 4 times per year    Marital Status: Married    Tobacco Counseling Counseling given: Not Answered   Clinical Intake:  Pre-visit preparation completed: No  Pain : No/denies pain Pain Score: 0-No pain     Nutritional Risks: None Diabetes: Yes Did pt. bring in CBG monitor from home?: No  How often do you need to have someone help you when you read instructions, pamphlets, or other written materials from your doctor or pharmacy?: 1 - Never  Interpreter Needed?: No      Activities of Daily Living    10/05/2023    9:30 AM  In your present state of health, do you have any difficulty performing the following activities:  Hearing? 0  Vision? 0  Difficulty concentrating or making decisions? 0  Walking or climbing stairs? 0  Dressing or bathing? 0  Doing errands, shopping? 0  Preparing  Food and eating ? N  Using the Toilet? N  In the past six months, have you accidently leaked urine? N  Do you have problems with loss of bowel control? N  Managing your Medications? N  Managing your Finances? N  Housekeeping or managing your Housekeeping? N    Patient Care Team: Ronnald Ramp, MD as PCP - General (Family Medicine) Hulen Luster, RN as Oncology Nurse Navigator Creig Hines, MD as Consulting Physician (Oncology) Carmina Miller, MD as Consulting Physician (Radiation Oncology) Lemar Livings Merrily Pew, MD as Consulting Physician (General Surgery)  Indicate any recent Medical Services you may have received from other than [facility] providers in the past year (date may be approximate).  Physical Exam Vitals reviewed.  Constitutional:      General: She is not in acute distress.    Appearance: Normal appearance. She is not ill-appearing, toxic-appearing or diaphoretic.  HENT:     Head: Normocephalic and atraumatic.     Right Ear: Tympanic membrane and external ear normal. There is no impacted cerumen.     Left Ear: Tympanic membrane and external ear normal. There is no impacted cerumen.     Nose: Nose normal.     Mouth/Throat:     Pharynx: Oropharynx is clear.  Eyes:     General: No scleral icterus.    Extraocular Movements: Extraocular movements intact.     Conjunctiva/sclera: Conjunctivae normal.     Pupils: Pupils are equal, round, and reactive to light.  Cardiovascular:     Rate and Rhythm: Normal rate and regular rhythm.     Pulses: Normal pulses.          Dorsalis pedis pulses are 2+ on the right side and 2+ on the left side.       Posterior tibial pulses are 2+ on the right side and 2+ on the left side.     Heart sounds: Normal heart sounds. No murmur heard.    No friction rub. No gallop.  Pulmonary:  Effort: Pulmonary effort is normal. No respiratory distress.     Breath sounds: Normal breath sounds. No wheezing, rhonchi or rales.  Abdominal:      General: Bowel sounds are normal. There is no distension.     Palpations: Abdomen is soft. There is no mass.     Tenderness: There is no abdominal tenderness. There is no guarding.  Musculoskeletal:        General: No deformity.     Cervical back: Normal range of motion and neck supple.     Right lower leg: No edema.     Left lower leg: No edema.     Right foot: Normal range of motion. No deformity or bunion.     Left foot: Normal range of motion. No deformity or bunion.  Feet:     Right foot:     Protective Sensation: 0 sites tested.  0 sites sensed.     Skin integrity: Skin integrity normal. No ulcer, blister, erythema, dry skin or fissure.     Toenail Condition: Right toenails are normal.     Left foot:     Protective Sensation: 6 sites tested.  6 sites sensed.     Skin integrity: Skin integrity normal. No ulcer, blister, erythema, dry skin or fissure.     Toenail Condition: Left toenails are normal.  Lymphadenopathy:     Cervical: No cervical adenopathy.  Skin:    General: Skin is warm.     Capillary Refill: Capillary refill takes less than 2 seconds.     Findings: No erythema or rash.  Neurological:     General: No focal deficit present.     Mental Status: She is alert and oriented to person, place, and time.     Cranial Nerves: Cranial nerves 2-12 are intact. No cranial nerve deficit or facial asymmetry.     Motor: Motor function is intact. No weakness.     Gait: Gait normal.  Psychiatric:        Mood and Affect: Mood normal.        Behavior: Behavior normal.         Assessment:   This is a routine wellness examination for Kathy Farmer.  Hearing/Vision screen No results found.   Goals Addressed   None   Depression Screen    05/06/2023    9:49 AM 08/19/2022    2:26 PM 05/20/2022    9:13 AM 03/14/2022    1:48 PM 08/13/2021    8:49 AM 11/15/2020   10:21 AM 07/27/2019    9:20 AM  PHQ 2/9 Scores  PHQ - 2 Score 2 2 4 4 1 2 1   PHQ- 9 Score 8 5 16 11 8 10 3     Fall  Risk    08/19/2022    2:26 PM 05/20/2022    9:13 AM 03/14/2022    1:48 PM 08/13/2021    8:49 AM 11/15/2020   10:22 AM  Fall Risk   Falls in the past year? 0 0 0 0 0  Number falls in past yr: 0 0 0 0 0  Injury with Fall? 0 0 0 0 0  Risk for fall due to :  No Fall Risks No Fall Risks No Fall Risks   Follow up  Falls evaluation completed Falls evaluation completed  Falls evaluation completed    MEDICARE RISK AT HOME:    TIMED UP AND GO:  Was the test performed?  No    Cognitive Function:  Immunizations Immunization History  Administered Date(s) Administered   Fluad Trivalent(High Dose 65+) 05/06/2023   Influenza Split 04/19/2012   Influenza,inj,Quad PF,6+ Mos 04/23/2013, 04/29/2014, 04/07/2017, 05/11/2018, 05/04/2019   Influenza-Unspecified 05/01/2015, 05/15/2021, 05/18/2021, 04/30/2022   Moderna Covid-19 Vaccine Bivalent Booster 59yrs & up 05/18/2021   Moderna Sars-Covid-2 Vaccination 09/17/2019, 10/15/2019, 06/29/2020   PNEUMOCOCCAL CONJUGATE-20 09/16/2022   Tdap 08/19/2006, 11/15/2020    TDAP status: Up to date  Flu Vaccine status: Up to date  Pneumococcal vaccine status: Up to date  Covid-19 vaccine status: Declined, Education has been provided regarding the importance of this vaccine but patient still declined. Advised may receive this vaccine at local pharmacy or Health Dept.or vaccine clinic. Aware to provide a copy of the vaccination record if obtained from local pharmacy or Health Dept. Verbalized acceptance and understanding.  Qualifies for Shingles Vaccine? Yes   Zostavax completed No   Shingrix Completed?: No.    Education has been provided regarding the importance of this vaccine. Patient has been advised to call insurance company to determine out of pocket expense if they have not yet received this vaccine. Advised may also receive vaccine at local pharmacy or Health Dept. Verbalized acceptance and understanding.  Screening Tests Health Maintenance   Topic Date Due   OPHTHALMOLOGY EXAM  Never done   Zoster Vaccines- Shingrix (1 of 2) Never done   COVID-19 Vaccine (5 - 2024-25 season) 03/01/2023   Diabetic kidney evaluation - Urine ACR  09/16/2023   HEMOGLOBIN A1C  11/03/2023   INFLUENZA VACCINE  01/29/2024   Diabetic kidney evaluation - eGFR measurement  05/19/2024   FOOT EXAM  10/04/2024   Medicare Annual Wellness (AWV)  10/04/2024   MAMMOGRAM  10/19/2024   Colonoscopy  09/15/2028   DTaP/Tdap/Td (3 - Td or Tdap) 11/16/2030   Pneumonia Vaccine 12+ Years old  Completed   DEXA SCAN  Completed   Hepatitis C Screening  Completed   HPV VACCINES  Aged Out    Health Maintenance  Health Maintenance Due  Topic Date Due   OPHTHALMOLOGY EXAM  Never done   Zoster Vaccines- Shingrix (1 of 2) Never done   COVID-19 Vaccine (5 - 2024-25 season) 03/01/2023   Diabetic kidney evaluation - Urine ACR  09/16/2023    Colorectal cancer screening: Type of screening: Colonoscopy. Completed 2020. Repeat every 10 years  Mammogram status: Ordered scheduled for 10/21/23. Pt provided with contact info and advised to call to schedule appt.   Bone Density status: Completed 2023. Results reflect: Bone density results: OSTEOPENIA. Repeat every 2 years. Ordered   Lung Cancer Screening: (Low Dose CT Chest recommended if Age 37-80 years, 20 pack-year currently smoking OR have quit w/in 15years.) does not qualify.   Lung Cancer Screening Referral: n/a  Additional Screening:  Hepatitis C Screening: does not qualify; Completed 2014, negative   Vision Screening: Recommended annual ophthalmology exams for early detection of glaucoma and other disorders of the eye. Is the patient up to date with their annual eye exam?  no, recommended she schedule appt    Dental Screening: Recommended annual dental exams for proper oral hygiene  Diabetic Foot Exam: Diabetic Foot Exam: Completed 10/05/23  Community Resource Referral / Chronic Care Management: CRR required  this visit?    CCM required this visit?  No Lab Results  Component Value Date   HGBA1C 5.8 (A) 05/06/2023     Chemistry      Component Value Date/Time   NA 141 05/20/2023 0934   NA 144 09/25/2021  0913   K 3.8 05/20/2023 0934   CL 103 05/20/2023 0934   CO2 28 05/20/2023 0934   BUN 21 05/20/2023 0934   BUN 15 09/25/2021 0913   CREATININE 1.54 (H) 05/20/2023 0934   GLU 93 03/13/2016 0000      Component Value Date/Time   CALCIUM 9.4 05/20/2023 0934   ALKPHOS 68 05/20/2023 0934   AST 26 05/20/2023 0934   ALT 26 05/20/2023 0934   BILITOT 0.7 05/20/2023 0934   BILITOT 0.5 09/25/2021 0913     Lab Results  Component Value Date   CHOL 181 11/15/2020   HDL 39 (L) 11/15/2020   LDLCALC 118 (H) 11/15/2020   TRIG 134 11/15/2020   CHOLHDL 4.6 (H) 11/15/2020   The 10-year ASCVD risk score (Arnett DK, et al., 2019) is: 17%    Plan:     I have personally reviewed and noted the following in the patient's chart:   Medical and social history Use of alcohol, tobacco or illicit drugs  Current medications and supplements including opioid prescriptions. Patient is not currently taking opioid prescriptions. Functional ability and status Nutritional status Physical activity Advanced directives List of other physicians Hospitalizations, surgeries, and ER visits in previous 12 months Vitals Screenings to include cognitive, depression, and falls Referrals and appointments  Assessment & Plan Diabetic Neuropathy Worsening neuropathy symptoms in the right foot, described as sensations of a hot knife and coldness, potentially related to previous ankle surgery. Symptoms align with diabetic neuropathy. Nutritional deficiencies, diabetes control, and orthopedic issues are considered potential contributors. - Order CBC, B12 level, thyroid function tests, and A1c to assess anemia, deficiency, thyroid function, and diabetes control - Suggest topical magnesium for neuropathy symptoms - Consider  orthopedic evaluation if labs are normal - Refer to podiatry if all lab results are normal   Diabetes Mellitus Ongoing diabetes management with previous good A1c. Repeat A1c planned to ensure continued control. Diabetes foot exam and urine albumin collection will monitor for complications. - Order A1c - Perform diabetes foot exam - Collect urine albumin  General Health Maintenance Routine health maintenance is current with colonoscopy and bone density screening. Mammogram is scheduled. Vitamin D levels will be checked per nephrologist's recommendation. - Order lipid panel and vitamin D level - Schedule mammogram for 10/21/2023   In addition, I have reviewed and discussed with patient certain preventive protocols, quality metrics, and best practice recommendations. A written personalized care plan for preventive services as well as general preventive health recommendations were provided to patient.     Ronnald Ramp, MD   10/05/2023

## 2023-10-05 NOTE — Patient Instructions (Signed)
 [facility] [address] [place],  [place]  [address] Main: [phone]   Please follow up with your eye specialist for your diabetes eye exam for this year   We completed your diabetes foot exam today.

## 2023-10-06 ENCOUNTER — Encounter: Payer: Self-pay | Admitting: Family Medicine

## 2023-10-06 LAB — CBC
Hematocrit: 38.5 % (ref 34.0–46.6)
Hemoglobin: 12.9 g/dL (ref 11.1–15.9)
MCH: 30.9 pg (ref 26.6–33.0)
MCHC: 33.5 g/dL (ref 31.5–35.7)
MCV: 92 fL (ref 79–97)
Platelets: 215 10*3/uL (ref 150–450)
RBC: 4.18 x10E6/uL (ref 3.77–5.28)
RDW: 13.2 % (ref 11.7–15.4)
WBC: 5.9 10*3/uL (ref 3.4–10.8)

## 2023-10-06 LAB — VITAMIN B12: Vitamin B-12: 329 pg/mL (ref 232–1245)

## 2023-10-06 LAB — BASIC METABOLIC PANEL WITH GFR
BUN/Creatinine Ratio: 13 (ref 12–28)
BUN: 17 mg/dL (ref 8–27)
CO2: 25 mmol/L (ref 20–29)
Calcium: 9.4 mg/dL (ref 8.7–10.3)
Chloride: 102 mmol/L (ref 96–106)
Creatinine, Ser: 1.27 mg/dL — ABNORMAL HIGH (ref 0.57–1.00)
Glucose: 89 mg/dL (ref 70–99)
Potassium: 3.8 mmol/L (ref 3.5–5.2)
Sodium: 142 mmol/L (ref 134–144)
eGFR: 46 mL/min/{1.73_m2} — ABNORMAL LOW (ref >59)

## 2023-10-06 LAB — HEMOGLOBIN A1C
Est. average glucose Bld gHb Est-mCnc: 111 mg/dL
Hgb A1c MFr Bld: 5.5 % (ref 4.8–5.6)

## 2023-10-06 LAB — LIPID PANEL
Chol/HDL Ratio: 4.4 ratio (ref 0.0–4.4)
Cholesterol, Total: 159 mg/dL (ref 100–199)
HDL: 36 mg/dL — ABNORMAL LOW (ref >39)
LDL Chol Calc (NIH): 99 mg/dL (ref 0–99)
Triglycerides: 132 mg/dL (ref 0–149)
VLDL Cholesterol Cal: 24 mg/dL (ref 5–40)

## 2023-10-06 LAB — TSH+T4F+T3FREE
Free T4: 1.1 ng/dL (ref 0.82–1.77)
T3, Free: 3.2 pg/mL (ref 2.0–4.4)
TSH: 1.06 u[IU]/mL (ref 0.450–4.500)

## 2023-10-06 LAB — VITAMIN D 25 HYDROXY (VIT D DEFICIENCY, FRACTURES): Vit D, 25-Hydroxy: 41.3 ng/mL (ref 30.0–100.0)

## 2023-10-06 LAB — MICROALBUMIN / CREATININE URINE RATIO
Creatinine, Urine: 219.4 mg/dL
Microalb/Creat Ratio: 6 mg/g{creat} (ref 0–29)
Microalbumin, Urine: 12.9 ug/mL

## 2023-10-21 ENCOUNTER — Ambulatory Visit
Admission: RE | Admit: 2023-10-21 | Discharge: 2023-10-21 | Disposition: A | Discharge status: Home / Self Care | Payer: BLUE CROSS/BLUE SHIELD | Source: Ambulatory Visit | Attending: Oncology | Admitting: Oncology

## 2023-10-21 DIAGNOSIS — C50412 Malignant neoplasm of upper-outer quadrant of left female breast: Secondary | ICD-10-CM | POA: Insufficient documentation

## 2023-10-21 DIAGNOSIS — Z170 Estrogen receptor positive status [ER+]: Secondary | ICD-10-CM | POA: Diagnosis present

## 2023-10-21 DIAGNOSIS — Z5181 Encounter for therapeutic drug level monitoring: Secondary | ICD-10-CM | POA: Diagnosis present

## 2023-10-21 DIAGNOSIS — M85851 Other specified disorders of bone density and structure, right thigh: Secondary | ICD-10-CM | POA: Insufficient documentation

## 2023-10-21 DIAGNOSIS — Z79811 Long term (current) use of aromatase inhibitors: Secondary | ICD-10-CM | POA: Insufficient documentation

## 2023-10-23 ENCOUNTER — Encounter: Payer: Self-pay | Admitting: Oncology

## 2023-10-27 ENCOUNTER — Encounter: Payer: Self-pay | Admitting: Oncology

## 2023-10-27 ENCOUNTER — Ambulatory Visit (INDEPENDENT_AMBULATORY_CARE_PROVIDER_SITE_OTHER)

## 2023-10-27 ENCOUNTER — Ambulatory Visit (INDEPENDENT_AMBULATORY_CARE_PROVIDER_SITE_OTHER): Admitting: Podiatry

## 2023-10-27 ENCOUNTER — Encounter: Payer: Self-pay | Admitting: Podiatry

## 2023-10-27 DIAGNOSIS — M779 Enthesopathy, unspecified: Secondary | ICD-10-CM

## 2023-10-27 DIAGNOSIS — M76821 Posterior tibial tendinitis, right leg: Secondary | ICD-10-CM

## 2023-10-27 DIAGNOSIS — M65971 Unspecified synovitis and tenosynovitis, right ankle and foot: Secondary | ICD-10-CM | POA: Diagnosis not present

## 2023-10-27 MED ORDER — METHYLPREDNISOLONE 4 MG PO TBPK: ORAL_TABLET | ORAL | 0 refills | Status: DC

## 2023-10-27 MED ORDER — BETAMETHASONE SOD PHOS & ACET 6 (3-3) MG/ML IJ SUSP
3.0000 mg | Freq: Once | INTRAMUSCULAR | Status: AC
  Administered 2023-10-27: 3 mg via INTRA_ARTICULAR

## 2023-10-27 NOTE — Progress Notes (Signed)
 Chief Complaint  Patient presents with   Numbness    "I have complete numbness in my right foot.  When I walk at night, it feels like little rocks are in my foot. I get shooting pains up the outer side of my foot, up my ankle and up my leg." N - shooting pains  in foot and numbness L - Metatarsal 1-5 right; lateral foot and ankle D - since 2018 O - worse last 3 months C - numbness, sharp pains A - walking long distance T - heat, thera - gun    HPI: 68 y.o. female presenting today as a new patient for evaluation of pain and tenderness associated to the right foot and ankle.  History of posterior tibial tendon repair and calcaneal osteotomy to the right foot 2018.  Since that time she has essentially had pain and tenderness.  Most recently she has tried to increase her activity and exercise but is limited due to the pain in the right foot and ankle.  She has a trip planned [place] and is very concerned due to the foot and ankle pain  Past Medical History:  Diagnosis Date   Anemia    Breast cancer (HCC) 2023   Depression    Dysrhythmia    racing heart- neg workup   GERD (gastroesophageal reflux disease)    History of PCOS    Hypertension    Kidney disease    44%  function   Personal history of radiation therapy    Pre-diabetes    Sleep apnea    inconclusive sleep study    Past Surgical History:  Procedure Laterality Date   ABDOMINAL HYSTERECTOMY     AXILLARY SENTINEL NODE BIOPSY Left 12/23/2021   Procedure: AXILLARY SENTINEL NODE BIOPSY;  Surgeon: Marshall Skeeter, MD;  Location: [facility] ORS;  Service: General;  Laterality: Left;   BREAST BIOPSY Left 11/18/2021   lt br stereo, distortion, x clip, [facility]   BREAST LUMPECTOMY Left 12/23/2021   [facility]   BREAST LUMPECTOMY WITH NEEDLE LOCALIZATION Left 12/23/2021   Procedure: BREAST LUMPECTOMY WITH NEEDLE LOCALIZATION;  Surgeon: Marshall Skeeter, MD;  Location: [facility] ORS;   Service: General;  Laterality: Left;   CESAREAN SECTION     DIAGNOSTIC LAPAROSCOPY     DILATION AND CURETTAGE OF UTERUS  06/30/2009   with hysteroecopy   TUBAL LIGATION Bilateral     No Known Allergies   Physical Exam: General: The patient is alert and oriented x3 in no acute distress.  Dermatology: Skin is warm, dry and supple bilateral lower extremities.   Vascular: Palpable pedal pulses bilaterally. Capillary refill within normal limits.  No appreciable edema.  No erythema.  Neurological: Grossly intact via light touch  Musculoskeletal Exam: Pain on palpation noted specifically to the anterolateral aspect of the right ankle joint as well as along the posterior tibial tendon left lower extremity.  Radiographic Exam RT foot 10/27/2023:  Normal osseous mineralization. Orthopedic screws x 2 noted to the body of the calcaneus.  It does appear that the more plantar screw is slightly backed out with the head of the screw prominent however there is no radiolucency around the screw.  No broken hardware.  It appears stable.  Assessment/Plan of Care: 1.  Capsulitis anterolateral aspect of the right ankle 2.  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  X-rays reviewed -Injection of 0.5 cc Celestone Soluspan  injected into the anterolateral aspect of the right ankle -Prescription for Medrol  Dosepak -History of CKD.  No NSAIDs -Appointment with orthotics department for custom orthotics.  I do believe the patient would benefit from custom molded orthotics to support the medial longitudinal arch of the foot and maintain the foot in a rectus position during weightbearing. -Return to clinic with me after wearing her orthotics for a few months     Dot Gazella, DPM [facility]  Dr. Dot Gazella, DPM    [address] [place], [place] [address]                Office [phone]  Fax [phone]

## 2023-11-05 ENCOUNTER — Telehealth: Payer: Self-pay | Admitting: Plastic Surgery

## 2023-11-05 NOTE — Telephone Encounter (Signed)
 Malignant neoplasm of upper-outer quadrant of left breast in female, estrogen receptor positive (CMS/HHS-HCC)    Called lvmail 11-05-23

## 2023-11-11 ENCOUNTER — Telehealth: Payer: Self-pay | Admitting: Plastic Surgery

## 2023-11-11 NOTE — Telephone Encounter (Signed)
 Malignant neoplasm of upper-outer quadrant of left breast in female, estrogen receptor positive (CMS/HHS-HCC)    Called lvmail 11-11-23

## 2023-11-13 ENCOUNTER — Encounter: Payer: Self-pay | Admitting: Nurse Practitioner

## 2023-11-13 ENCOUNTER — Inpatient Hospital Stay: Admitting: Oncology

## 2023-11-13 ENCOUNTER — Inpatient Hospital Stay: Attending: Nurse Practitioner | Admitting: Nurse Practitioner

## 2023-11-13 VITALS — BP 125/79 | HR 64 | Temp 97.9°F | Resp 12 | Ht 70.0 in | Wt 206.3 lb

## 2023-11-13 DIAGNOSIS — M85851 Other specified disorders of bone density and structure, right thigh: Secondary | ICD-10-CM | POA: Diagnosis not present

## 2023-11-13 DIAGNOSIS — Z170 Estrogen receptor positive status [ER+]: Secondary | ICD-10-CM | POA: Diagnosis not present

## 2023-11-13 DIAGNOSIS — Z5181 Encounter for therapeutic drug level monitoring: Secondary | ICD-10-CM | POA: Diagnosis not present

## 2023-11-13 DIAGNOSIS — Z79811 Long term (current) use of aromatase inhibitors: Secondary | ICD-10-CM | POA: Insufficient documentation

## 2023-11-13 DIAGNOSIS — C50412 Malignant neoplasm of upper-outer quadrant of left female breast: Secondary | ICD-10-CM | POA: Diagnosis present

## 2023-11-13 DIAGNOSIS — Z923 Personal history of irradiation: Secondary | ICD-10-CM | POA: Diagnosis not present

## 2023-11-13 DIAGNOSIS — M810 Age-related osteoporosis without current pathological fracture: Secondary | ICD-10-CM | POA: Insufficient documentation

## 2023-11-13 NOTE — Progress Notes (Signed)
 10/27/23 ankle/foot  xray, 10/21/23 bone density and mammogram. Would like results and comparison of bone density.

## 2023-11-13 NOTE — Progress Notes (Signed)
 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  This was followed by diagnostic mammogram and ultrasound which showed architectural distortion in the lateral left breast at the 3 o'clock position.  There were a group of cysts measuring 7 x 2 x 3 mm.  Stereotactic biopsy of the architectural distortion was recommended.  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     Final pathology showed 8 mm grade 1 tumor with negative margins. 5 Sentinel lymph nodes negative for malignancy.  Genetic testing negative.patient completed adjuvant radiation and started letrozole  in sept 2023.  She did not require Oncotype testing.  Patient could not tolerate letrozole  and was subsequently switched to Arimidex    Baseline bone density scan does show osteopenia of the AP spine and right femur with a T-score of -2.  10-year probability of a major osteoporotic fracture 29.6% and hip fracture 2.8%.  Unable to tolerate Fosamax  due to reflux  Interval history- Kathy Farmer is a 68 y.o. female with above history of breast cancer,  on arimidex , who returns to clinic for continued surveillance. She is followed by Dr. Dennise for her ckd stage III.   ECOG PS- 1 Pain scale- 0  Review of systems- Review of Systems  Constitutional:  Negative for chills, fever, malaise/fatigue and weight loss.  HENT:  Negative for congestion, ear discharge and nosebleeds.   Eyes:  Negative for blurred vision.  Respiratory:  Negative for cough, hemoptysis, sputum production, shortness of breath and wheezing.   Cardiovascular:  Negative for chest pain, palpitations, orthopnea and claudication.  Gastrointestinal:  Negative for abdominal pain, blood in stool, constipation, diarrhea, heartburn, melena, nausea and vomiting.  Genitourinary:  Negative for dysuria, flank pain, frequency, hematuria and urgency.  Musculoskeletal:  Negative for back pain, joint pain and myalgias.  Skin:  Negative for rash.  Neurological:  Negative for dizziness, tingling, focal weakness, seizures, weakness and headaches.  Endo/Heme/Allergies:  Does not bruise/bleed easily.  Psychiatric/Behavioral:  Negative for depression and suicidal ideas. The patient does not have insomnia.     No Known Allergies  Past Medical History:  Diagnosis Date   Anemia    Breast cancer (HCC) 2023   Depression    Dysrhythmia    racing heart- neg workup   GERD (gastroesophageal reflux disease)    History of PCOS    Hypertension    Kidney disease    44%  function   Personal history of radiation therapy    Pre-diabetes    Sleep apnea    inconclusive sleep study   Past Surgical History:  Procedure Laterality Date   ABDOMINAL HYSTERECTOMY     AXILLARY SENTINEL NODE BIOPSY Left 12/23/2021   Procedure: AXILLARY SENTINEL NODE BIOPSY;  Surgeon: Dessa Reyes ORN, MD;  Location: [facility] ORS;  Service: General;  Laterality: Left;   BREAST BIOPSY Left 11/18/2021   lt br stereo, distortion, x clip, [name]   BREAST LUMPECTOMY Left 12/23/2021   [facility]   BREAST LUMPECTOMY WITH NEEDLE LOCALIZATION  Left 12/23/2021   Procedure: BREAST LUMPECTOMY WITH NEEDLE LOCALIZATION;  Surgeon: Dessa Reyes ORN, MD;  Location: [facility] ORS;  Service: General;  Laterality: Left;   CESAREAN SECTION     DIAGNOSTIC LAPAROSCOPY     DILATION AND CURETTAGE OF UTERUS  06/30/2009   with hysteroecopy   TUBAL LIGATION Bilateral    Social History   Socioeconomic History   Marital status: Married    Spouse name: Not on file   Number of children: 2   Years of education: Not on file   Highest education level: Master's degree (e.g., MA, MS, MEng, MEd, MSW, MBA)  Occupational History    Employer: [employer]  Tobacco Use   Smoking status: Never   Smokeless tobacco: Never  Vaping Use   Vaping status: Never Used  Substance and Sexual Activity   Alcohol use: Yes    Comment: 2 drinks a month   Drug use: No   Sexual activity: Yes    Partners: Male  Other Topics Concern   Not on file  Social History Narrative   Not on file   Social Drivers of Health   Financial Resource Strain: Low Risk  (05/05/2023)   Overall Financial Resource Strain (CARDIA)    Difficulty of Paying Living Expenses: Not hard at all  Food Insecurity: No Food Insecurity (05/05/2023)   Hunger Vital Sign    Worried About Running Out of Food in the Last Year: Never true    Ran Out of Food in the Last Year: Never true  Transportation Needs: No Transportation Needs (05/05/2023)   PRAPARE - Administrator, Civil Service (Medical): No    Lack of Transportation (Non-Medical): No  Physical Activity: Insufficiently Active (05/05/2023)   Exercise Vital Sign    Days of Exercise per Week: 3 days    Minutes of Exercise per Session: 20 min  Stress: Stress Concern Present (05/05/2023)   [employer] of Occupational Health - Occupational Stress Questionnaire    Feeling of Stress : To some extent  Social Connections: Moderately Integrated (05/05/2023)   Social Connection and Isolation Panel [NHANES]    Frequency of  Communication with Friends and Family: Once a week    Frequency of Social Gatherings with Friends and Family: Once a week    Attends Religious Services: More than 4 times per year    Active Member of [employer] or Organizations: Yes    Attends Banker Meetings: 1 to 4 times per year    Marital Status: Married  Catering manager Violence: Not At Risk (10/05/2023)   Humiliation, Afraid, Rape, and Kick questionnaire    Fear of Current or Ex-Partner: No    Emotionally Abused: No    Physically Abused: No    Sexually Abused: No   Family History  Problem Relation Age of Onset   Breast cancer Mother 53   Osteoporosis Mother    Diabetes Father    Hypertension Father    Cancer Brother        cholangiocarcinoma   Breast cancer Maternal Aunt 78   Pancreatic cancer Maternal Uncle    Lung cancer Maternal Grandfather    Pancreatic cancer Paternal Grandmother    Cancer - Other  Paternal Grandmother        cholangiocarcinoma    Current Outpatient Medications:    anastrozole  (ARIMIDEX ) 1 MG tablet, Take 1 tablet (1 mg total) by mouth daily., Disp: 90 tablet, Rfl: 1   citalopram  (CELEXA ) 20 MG tablet, TAKE 1 TABLET BY MOUTH DAILY, Disp: 90 tablet, Rfl: 1   cyanocobalamin  1000 MCG tablet, Take 1,000 mcg by mouth daily., Disp: , Rfl:    famotidine (PEPCID) 40 MG tablet, Take 1 tablet by mouth at bedtime., Disp: , Rfl:    FARXIGA 5 MG TABS tablet, Take 5 mg by mouth daily., Disp: , Rfl:    hydrochlorothiazide  (HYDRODIURIL ) 12.5 MG tablet, Take by mouth., Disp: , Rfl:    losartan  (COZAAR ) 25 MG tablet, Take 25 mg by mouth daily., Disp: , Rfl:    Semaglutide , 1 MG/DOSE, (OZEMPIC , 1 MG/DOSE,) 4 MG/3ML SOPN, Inject 1 mg into the skin once a week., Disp: 3 mL, Rfl: 3   methylPREDNISolone  (MEDROL  DOSEPAK) 4 MG TBPK tablet, 6 day dose pack - take as directed (Patient not taking: Reported on 11/13/2023), Disp: 21 tablet, Rfl: 0  Physical exam:  Vitals:   11/13/23 0956  BP: 125/79  Pulse: 64  Resp:  12  Temp: 97.9 F (36.6 C)  TempSrc: Tympanic  SpO2: 98%  Weight: 206 lb 4.8 oz (93.6 kg)  Height: 5' 10 (1.778 m)   Physical Exam Cardiovascular:     Rate and Rhythm: Normal rate and regular rhythm.     Heart sounds: Normal heart sounds.  Pulmonary:     Effort: Pulmonary effort is normal.     Breath sounds: Normal breath sounds.  Skin:    General: Skin is warm and dry.  Neurological:     Mental Status: She is alert and oriented to person, place, and time.         Latest Ref Rng & Units 10/05/2023   10:15 AM  CMP  Glucose 70 - 99 mg/dL 89   BUN 8 - 27 mg/dL 17   Creatinine 9.42 - 1.00 mg/dL 8.72   Sodium 865 - 855 mmol/L 142   Potassium 3.5 - 5.2 mmol/L 3.8   Chloride 96 - 106 mmol/L 102   CO2 20 - 29 mmol/L 25   Calcium 8.7 - 10.3 mg/dL 9.4       Latest Ref Rng & Units 10/05/2023   10:15 AM  CBC  WBC 3.4 - 10.8 x10E3/uL 5.9   Hemoglobin 11.1 - 15.9 g/dL 87.0   Hematocrit 65.9 - 46.6 % 38.5   Platelets 150 - 450 x10E3/uL 215      Assessment and plan- Patient is a 68 y.o. 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  s/p lumpectomy and adjuvant radiation therapy. Currently on surveillance. Tolerating arimidex  well. Continue for at least 5 years. Reviewed risk of worsening bone density (see below). April 2025 mammogram reviewed and rpeorted as birads 2: benign. Plan to repeat annually. Reviewed symptoms that would be concerning for recurrent disease and nccn survivorship recommendations. She will continue follow up with Dr Melanee.  Osteoporosis- T score right femoral neck -2.5. Worsening. Unable to tolerate fosamax  d/t worsening reflux. Declines Tamoxifen d/t hx of dementia in her mother who took tamoxifen. Previously had worsening reflux symptoms with fosamax . Discussed option of bisphosphonates including reclast . REviewed risk s including acute kidney injury, osteonecrosis of jaw  with recommendation for dental clearance. Recommend use  of oral calcium and vitamin d  prior to starting infusion. Can dose acetaminophen  following infusion for reduction of risk of flu like symptoms or pains. Plan for reclast  so long as GFR > 35. Will need to monitor kidney function closely. Recommend she continue to see Dr Dennise to help optimize kidney function and avoid nephrotoxic substances.   Disposition:  Needs dental clearance  1 mo (approx) lab (cbc, cmp, vit d), Dr Melanee, +/- reclast - [place]   Visit Diagnosis 1. Malignant neoplasm of upper-outer quadrant of left breast in female, estrogen receptor positive (HCC)   2. Osteopenia of neck of right femur   3. Encounter for monitoring aromatase inhibitor therapy    Tinnie Dawn, DNP, AGNP-C, [facility] [place] [facility] [phone] (clinic) 11/13/2023

## 2023-11-17 ENCOUNTER — Ambulatory Visit: Payer: BLUE CROSS/BLUE SHIELD | Admitting: Oncology

## 2023-11-18 ENCOUNTER — Telehealth: Payer: Self-pay | Admitting: Plastic Surgery

## 2023-11-18 NOTE — Telephone Encounter (Signed)
 Called again to try and r/sch.   Pt apt is in Aug  This apt must be moved up if the pt calls back

## 2023-11-19 ENCOUNTER — Telehealth: Payer: Self-pay | Admitting: Plastic Surgery

## 2023-11-19 NOTE — Telephone Encounter (Signed)
 Called pt again lvmail to try and move apt up

## 2023-11-27 ENCOUNTER — Ambulatory Visit (INDEPENDENT_AMBULATORY_CARE_PROVIDER_SITE_OTHER)

## 2023-11-27 DIAGNOSIS — M2141 Flat foot [pes planus] (acquired), right foot: Secondary | ICD-10-CM

## 2023-11-27 DIAGNOSIS — M2142 Flat foot [pes planus] (acquired), left foot: Secondary | ICD-10-CM | POA: Diagnosis not present

## 2023-11-27 DIAGNOSIS — M76821 Posterior tibial tendinitis, right leg: Secondary | ICD-10-CM

## 2023-11-30 NOTE — Progress Notes (Signed)
 Orthotics   Patient was present and evaluated for Custom molded foot orthotics. Patient will benefit from CFO's to provide total contact to BIL MLA's helping to balance and distribute body weight more evenly across BIL feet helping to reduce plantar pressure and pain. Orthotic will also encourage FF / RF alignment  Patient was scanned today and will return for fitting upon receipt

## 2023-12-01 ENCOUNTER — Encounter: Payer: Self-pay | Admitting: Plastic Surgery

## 2023-12-01 ENCOUNTER — Ambulatory Visit (INDEPENDENT_AMBULATORY_CARE_PROVIDER_SITE_OTHER): Admitting: Plastic Surgery

## 2023-12-01 VITALS — BP 100/69 | HR 63 | Ht 70.0 in | Wt 206.0 lb

## 2023-12-01 DIAGNOSIS — R202 Paresthesia of skin: Secondary | ICD-10-CM | POA: Diagnosis not present

## 2023-12-01 DIAGNOSIS — N62 Hypertrophy of breast: Secondary | ICD-10-CM | POA: Insufficient documentation

## 2023-12-01 DIAGNOSIS — M542 Cervicalgia: Secondary | ICD-10-CM | POA: Diagnosis not present

## 2023-12-01 DIAGNOSIS — Z170 Estrogen receptor positive status [ER+]: Secondary | ICD-10-CM

## 2023-12-01 DIAGNOSIS — M792 Neuralgia and neuritis, unspecified: Secondary | ICD-10-CM

## 2023-12-01 DIAGNOSIS — C50412 Malignant neoplasm of upper-outer quadrant of left female breast: Secondary | ICD-10-CM

## 2023-12-01 DIAGNOSIS — E119 Type 2 diabetes mellitus without complications: Secondary | ICD-10-CM

## 2023-12-01 DIAGNOSIS — G8929 Other chronic pain: Secondary | ICD-10-CM

## 2023-12-01 DIAGNOSIS — M549 Dorsalgia, unspecified: Secondary | ICD-10-CM | POA: Insufficient documentation

## 2023-12-01 DIAGNOSIS — M546 Pain in thoracic spine: Secondary | ICD-10-CM

## 2023-12-01 NOTE — Progress Notes (Signed)
 Patient ID: Kathy Farmer, female    DOB: [birth year], 68 y.o.   MRN: [id number]   Chief Complaint  Patient presents with   consult   Breast Problem   Breast Cancer    Patient is a 68 year old female here for evaluation of her breasts.  She had a routine screening mammogram done in April 2023 and that showed some distortion of the left breast.  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  She underwent a left partial mastectomy with postop radiation.  She now feels like she has excess breast weight as well as asymmetry.  She is interested in bilateral breast reduction.  She has had issues with neck and back pain.  She has grooving of her shoulders.  She has skin breakdown in her skin folds and rashes when it gets really warm.  She has had osteopenia and issues with her spine.  The symptoms are not controlled with over-the-counter medications or with manual relief or padding of her bra.     Review of Systems  Constitutional:  Positive for activity change. Negative for appetite change.  Eyes: Negative.   Respiratory: Negative.    Cardiovascular: Negative.   Gastrointestinal: Negative.   Endocrine: Negative.   Genitourinary: Negative.   Musculoskeletal:  Positive for back pain and neck pain.  Skin:  Positive for rash.  Neurological: Negative.     Past Medical History:  Diagnosis Date   Anemia    Breast cancer (HCC) 2023   Depression    Dysrhythmia    racing heart- neg workup   GERD (gastroesophageal reflux disease)    History of PCOS    Hypertension    Kidney disease    44%  function   Personal history of radiation therapy    Pre-diabetes    Sleep apnea    inconclusive sleep study    Past Surgical History:  Procedure Laterality Date   ABDOMINAL HYSTERECTOMY     AXILLARY SENTINEL NODE BIOPSY Left 12/23/2021   Procedure: AXILLARY SENTINEL  NODE BIOPSY;  Surgeon: Marshall Skeeter, MD;  Location: [facility] ORS;  Service: General;  Laterality: Left;   BREAST BIOPSY Left 11/18/2021   lt br stereo, distortion, x clip, [facility]   BREAST LUMPECTOMY Left 12/23/2021   [facility]   BREAST LUMPECTOMY WITH NEEDLE LOCALIZATION Left 12/23/2021   Procedure: BREAST LUMPECTOMY WITH NEEDLE LOCALIZATION;  Surgeon: Marshall Skeeter, MD;  Location: [facility] ORS;  Service: General;  Laterality: Left;   CESAREAN SECTION     DIAGNOSTIC LAPAROSCOPY     DILATION AND CURETTAGE OF UTERUS  06/30/2009   with hysteroecopy   TUBAL LIGATION Bilateral       Current Outpatient Medications:    anastrozole  (ARIMIDEX ) 1 MG tablet, Take 1 tablet (1 mg total) by mouth daily., Disp: 90 tablet, Rfl: 1   CALCIUM-VITAMIN D  PO, Take by mouth., Disp: , Rfl:    citalopram  (CELEXA ) 20 MG tablet, TAKE 1 TABLET BY MOUTH DAILY, Disp: 90 tablet, Rfl: 1   cyanocobalamin  1000 MCG tablet, Take 1,000 mcg by mouth daily., Disp: , Rfl:    famotidine (PEPCID) 40 MG tablet, Take 1 tablet by mouth at bedtime., Disp: , Rfl:    FARXIGA 5 MG TABS tablet, Take 5 mg by mouth daily., Disp: , Rfl:    hydrochlorothiazide  (HYDRODIURIL ) 12.5 MG tablet, Take by mouth., Disp: , Rfl:  losartan  (COZAAR ) 25 MG tablet, Take 25 mg by mouth daily., Disp: , Rfl:    Semaglutide , 1 MG/DOSE, (OZEMPIC , 1 MG/DOSE,) 4 MG/3ML SOPN, Inject 1 mg into the skin once a week., Disp: 3 mL, Rfl: 3   methylPREDNISolone  (MEDROL  DOSEPAK) 4 MG TBPK tablet, 6 day dose pack - take as directed (Patient not taking: Reported on 11/13/2023), Disp: 21 tablet, Rfl: 0   Objective:   Vitals:   12/01/23 1230  BP: 100/69  Pulse: 63  SpO2: 95%    Physical Exam Vitals reviewed.  Constitutional:      Appearance: Normal appearance.  HENT:     Head: Atraumatic.  Cardiovascular:     Rate and Rhythm: Normal rate.     Pulses: Normal pulses.  Pulmonary:     Effort: Pulmonary effort is normal.  Abdominal:     General: There is no  distension.     Palpations: Abdomen is soft.     Tenderness: There is no abdominal tenderness.  Skin:    General: Skin is warm.     Capillary Refill: Capillary refill takes less than 2 seconds.  Neurological:     Mental Status: She is alert and oriented to person, place, and time.  Psychiatric:        Mood and Affect: Mood normal.        Behavior: Behavior normal.        Judgment: Judgment normal.     Assessment & Plan:  Type 2 diabetes mellitus without complication, without long-term current use of insulin (HCC)  Malignant neoplasm of upper-outer quadrant of left breast in female, estrogen receptor positive (HCC)  Neuropathic pain, leg, right  Paresthesia of right foot  Symptomatic mammary hypertrophy  Chronic bilateral thoracic back pain  Neck pain  The procedure the patient selected and that was best for the patient was discussed. The risk were discussed and include but not limited to the following:  Breast asymmetry, fluid accumulation, firmness of the breast, inability to breast feed, loss of nipple or areola, skin loss, change in skin and nipple sensation, fat necrosis of the breast tissue, bleeding, infection and healing delay.  There are risks of anesthesia and injury to nerves or blood vessels.  Allergic reaction to tape, suture and skin glue are possible.  There will be swelling.  Any of these can lead to the need for revisional surgery which is not included in this surgery.  A breast reduction has potential to interfere with diagnostic procedures in the future.  This procedure is best done when the breast is fully developed.  Changes in the breast will continue to occur over time: pregnancy, weight gain or weigh loss. No guarantees are given for a certain bra or breast size.    Total time: 40 minutes. This includes time spent with the patient during the visit as well as time spent before and after the visit reviewing the chart, documenting the encounter, ordering pertinent  studies and literature for the patient.   Physical therapy:  ordered Mammogram:  up to date 2025  The patient is a good candidate for bilateral breast reduction.  She does have an increased risk of possibility for complications because of her medical conditions.  Her obesity and diabetes or increased blood sugar increases her risk.  She also had radiation of the left breast so the risk of losing her nipple is real and this was discussed.  This would require reconstruction with nipple areola tattoo if this were to happen.  Patient will see us  back after she has the physical therapy.  Pictures were obtained of the patient and placed in the chart with the patient's or guardian's permission.   Lindaann Requena Barri Neidlinger, DO

## 2023-12-15 ENCOUNTER — Ambulatory Visit: Admitting: Oncology

## 2023-12-15 ENCOUNTER — Other Ambulatory Visit

## 2023-12-15 ENCOUNTER — Ambulatory Visit

## 2023-12-18 ENCOUNTER — Telehealth: Payer: Self-pay

## 2023-12-18 NOTE — Telephone Encounter (Signed)
 LVM to schedule orthotic PU

## 2023-12-26 ENCOUNTER — Other Ambulatory Visit: Payer: Self-pay | Admitting: Family Medicine

## 2023-12-26 DIAGNOSIS — E119 Type 2 diabetes mellitus without complications: Secondary | ICD-10-CM

## 2023-12-28 NOTE — Therapy (Deleted)
 OUTPATIENT PHYSICAL THERAPY CERVICAL EVALUATION   Patient Name: Kathy Farmer MRN: [id number] DOB:Jun 27, 1956, 68 y.o., female Today's Date: 12/28/2023  END OF SESSION:   Past Medical History:  Diagnosis Date   Anemia    Breast cancer (HCC) 2023   Depression    Dysrhythmia    racing heart- neg workup   GERD (gastroesophageal reflux disease)    History of PCOS    Hypertension    Kidney disease    44%  function   Personal history of radiation therapy    Pre-diabetes    Sleep apnea    inconclusive sleep study   Past Surgical History:  Procedure Laterality Date   ABDOMINAL HYSTERECTOMY     AXILLARY SENTINEL NODE BIOPSY Left 12/23/2021   Procedure: AXILLARY SENTINEL NODE BIOPSY;  Surgeon: Dessa Reyes ORN, MD;  Location: [facility] OR[facility];  Service: General;  Laterality: Left;   BREAST BIOPSY Left 11/18/2021   lt br stereo, distortion, x clip, [facility]   BREAST LUMPECTOMY Left 12/23/2021   [facility]   BREAST LUMPECTOMY WITH NEEDLE LOCALIZATION Left 12/23/2021   Procedure: BREAST LUMPECTOMY WITH NEEDLE LOCALIZATION;  Surgeon: Dessa Reyes ORN, MD;  Location: [facility];  Service: General;  Laterality: Left;   CESAREAN SECTION     DIAGNOSTIC LAPAROSCOPY     DILATION AND CURETTAGE OF UTERUS  06/30/2009   with hysteroecopy   TUBAL LIGATION Bilateral    Patient Active Problem List   Diagnosis Date Noted   Symptomatic mammary hypertrophy 12/01/2023   Back pain 12/01/2023   Neck pain 12/01/2023   Hyperlipidemia associated with type 2 diabetes mellitus (HCC) 10/05/2023   Neuropathic pain, leg, right 10/05/2023   Type 2 diabetes mellitus without complication, without long-term current use of insulin (HCC) 12/30/2022   Essential (primary) hypertension 08/18/2022   Insomnia 05/20/2022   Right foot pain 05/20/2022   Acute cough 05/20/2022   Positive depression screening 05/20/2022   Avitaminosis D 01/17/2022   Genetic testing 01/08/2022   Malignant neoplasm of upper-outer quadrant of  left breast in female, estrogen receptor positive (HCC) 11/27/2021   Osteopenia of multiple sites 11/15/2021   Abnormal mammogram 11/07/2021   LUQ abdominal pain 09/12/2021   Paresthesia of right foot 08/13/2021   B12 deficiency 11/20/2016   Clinical depression 07/12/2015   Elevation of level of transaminase or lactic acid dehydrogenase (LDH) 07/12/2015   GERD (gastroesophageal reflux disease) 07/12/2015   Chronic kidney disease, stage 3a (HCC) 07/12/2015   Obesity (BMI 30-39.9) 07/12/2015   Apnea, sleep 07/12/2015   Tension type headache 07/12/2015   Thoracic outlet syndrome 07/12/2015   Raynaud's phenomenon 12/15/2014   Leg cramps, sleep related 12/15/2014   Closed fracture of glenoid cavity of scapula 07/27/2014   Shoulder subluxation 07/27/2014   Posterior tibial tendinitis 07/03/2014   Weight gain 03/16/2014   Polycystic ovarian syndrome 03/16/2014    PCP: ***  REFERRING PROVIDER: ***  REFERRING DIAG: ***  THERAPY DIAG:  No diagnosis found.  Rationale for Evaluation and Treatment: {HABREHAB:27488}  ONSET DATE: ***  SUBJECTIVE:  SUBJECTIVE STATEMENT: *** Hand dominance: {MISC; OT HAND DOMINANCE:[phone]}  From Dr. Ace note on 12/01/2023:  Patient is a 68 year old female here for evaluation of her breasts. She had a routine screening mammogram done in April 2023 and that showed some distortion of the left breast. 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 She underwent a left partial mastectomy with postop radiation. She now feels like she has excess breast weight as well as asymmetry. She is interested in bilateral breast reduction. She has had  issues with neck and back pain. She has grooving of her shoulders. She has skin breakdown in her skin folds and rashes when it gets really warm. She has had osteopenia and issues with her spine. The symptoms are not controlled with over-the-counter medications or with manual relief or padding of her bra.   PERTINENT HISTORY:  ***  PAIN:  Are you having pain? {OPRCPAIN:27236}  PRECAUTIONS: {Therapy precautions:24002}  RED FLAGS: {PT Red Flags:29287}     WEIGHT BEARING RESTRICTIONS: {Yes ***/No:24003}  FALLS:  Has patient fallen in last 6 months? {fallsyesno:27318}  LIVING ENVIRONMENT: Lives with: {OPRC lives with:25569::lives with their family} Lives in: {Lives in:25570} Stairs: {opstairs:27293} Has following equipment at home: {Assistive devices:23999}  OCCUPATION: ***  PLOF: {PLOF:24004}  PATIENT GOALS: ***  NEXT MD VISIT: ***  OBJECTIVE:  Note: Objective measures were completed at Evaluation unless otherwise noted.  DIAGNOSTIC FINDINGS:  ***  PATIENT SURVEYS:  {rehab surveys:24030}  COGNITION: Overall cognitive status: {cognition:24006}  SENSATION: {sensation:27233}  POSTURE: {posture:25561}  PALPATION: ***   CERVICAL ROM:   {AROM/PROM:27142} ROM A/PROM (deg) eval  Flexion   Extension   Right lateral flexion   Left lateral flexion   Right rotation   Left rotation    (Blank rows = not tested)  UPPER EXTREMITY ROM:  {AROM/PROM:27142} ROM Right eval Left eval  Shoulder flexion    Shoulder extension    Shoulder abduction    Shoulder adduction    Shoulder extension    Shoulder internal rotation    Shoulder external rotation    Elbow flexion    Elbow extension    Wrist flexion    Wrist extension    Wrist ulnar deviation    Wrist radial deviation    Wrist pronation    Wrist supination     (Blank rows = not tested)  UPPER EXTREMITY MMT:  MMT Right eval Left eval  Shoulder flexion    Shoulder extension    Shoulder abduction     Shoulder adduction    Shoulder extension    Shoulder internal rotation    Shoulder external rotation    Middle trapezius    Lower trapezius    Elbow flexion    Elbow extension    Wrist flexion    Wrist extension    Wrist ulnar deviation    Wrist radial deviation    Wrist pronation    Wrist supination    Grip strength     (Blank rows = not tested)  CERVICAL SPECIAL TESTS:  {Cervical special tests:25246}  FUNCTIONAL TESTS:  {Functional tests:24029}  TREATMENT DATE: ***  PATIENT EDUCATION:  Education details: *** Person educated: {Person educated:25204} Education method: {Education Method:25205} Education comprehension: {Education Comprehension:25206}  HOME EXERCISE PROGRAM: ***  ASSESSMENT:  CLINICAL IMPRESSION: Patient is a *** y.o. *** who was seen today for physical therapy evaluation and treatment for ***.   OBJECTIVE IMPAIRMENTS: {opptimpairments:25111}.   ACTIVITY LIMITATIONS: {activitylimitations:27494}  PARTICIPATION LIMITATIONS: {participationrestrictions:25113}  PERSONAL FACTORS: {Personal factors:25162} are also affecting patient's functional outcome.   REHAB POTENTIAL: {rehabpotential:25112}  CLINICAL DECISION MAKING: {clinical decision making:25114}  EVALUATION COMPLEXITY: {Evaluation complexity:25115}   GOALS: Goals reviewed with patient? {yes/no:20286}  SHORT TERM GOALS: Target date: ***  *** Baseline:  Goal status: INITIAL  2.  *** Baseline:  Goal status: INITIAL  3.  *** Baseline:  Goal status: INITIAL  4.  *** Baseline:  Goal status: INITIAL  5.  *** Baseline:  Goal status: INITIAL  6.  *** Baseline:  Goal status: INITIAL  LONG TERM GOALS: Target date: ***  *** Baseline:  Goal status: INITIAL  2.  *** Baseline:  Goal status: INITIAL  3.  *** Baseline:  Goal status: INITIAL  4.   *** Baseline:  Goal status: INITIAL  5.  *** Baseline:  Goal status: INITIAL  6.  *** Baseline:  Goal status: INITIAL   PLAN:  PT FREQUENCY: {rehab frequency:25116}  PT DURATION: {rehab duration:25117}  PLANNED INTERVENTIONS: {rehab planned interventions:25118::97110-Therapeutic exercises,97530- Therapeutic [id number]- Neuromuscular re-education,97535- Self Rjmz,02859- Manual therapy}  PLAN FOR NEXT SESSION: ***   Camie JONELLE Cleverly, PT 12/28/2023, 1:41 PM

## 2023-12-28 NOTE — Therapy (Signed)
 OUTPATIENT PHYSICAL THERAPY EVALUATION   Patient Name: Kathy Farmer MRN: [id number] DOB:01/06/1956, 68 y.o., female Today's Date: 12/31/2023  END OF SESSION:  PT End of Session - 12/31/23 1948     Visit Number 1    Number of Visits 10    Date for PT Re-Evaluation 03/24/24    Authorization Type MEDICARE PART B reporting period from 12/31/2023    Progress Note Due on Visit 10    PT Start Time 1039    PT Stop Time 1125    PT Time Calculation (min) 46 min    Activity Tolerance Patient tolerated treatment well    Behavior During Therapy WFL for tasks assessed/performed          Past Medical History:  Diagnosis Date   Anemia    Breast cancer (HCC) 2023   Depression    Dysrhythmia    racing heart- neg workup   GERD (gastroesophageal reflux disease)    History of PCOS    Hypertension    Kidney disease    44%  function   Personal history of radiation therapy    Pre-diabetes    Sleep apnea    inconclusive sleep study   Past Surgical History:  Procedure Laterality Date   ABDOMINAL HYSTERECTOMY     AXILLARY SENTINEL NODE BIOPSY Left 12/23/2021   Procedure: AXILLARY SENTINEL NODE BIOPSY;  Surgeon: Dessa Reyes ORN, MD;  Location: [facility] ORS;  Service: General;  Laterality: Left;   BREAST BIOPSY Left 11/18/2021   lt br stereo, distortion, x clip, [facility]   BREAST LUMPECTOMY Left 12/23/2021   [facility]   BREAST LUMPECTOMY WITH NEEDLE LOCALIZATION Left 12/23/2021   Procedure: BREAST LUMPECTOMY WITH NEEDLE LOCALIZATION;  Surgeon: Dessa Reyes ORN, MD;  Location: [facility] OR[facility];  Service: General;  Laterality: Left;   CESAREAN SECTION     DIAGNOSTIC LAPAROSCOPY     DILATION AND CURETTAGE OF UTERUS  06/30/2009   with hysteroecopy   TUBAL LIGATION Bilateral    Patient Active Problem List   Diagnosis Date Noted   Symptomatic mammary hypertrophy 12/01/2023   Back pain 12/01/2023   Neck pain 12/01/2023   Hyperlipidemia associated with type 2 diabetes mellitus (HCC) 10/05/2023    Neuropathic pain, leg, right 10/05/2023   Type 2 diabetes mellitus without complication, without long-term current use of insulin (HCC) 12/30/2022   Essential (primary) hypertension 08/18/2022   Insomnia 05/20/2022   Right foot pain 05/20/2022   Acute cough 05/20/2022   Positive depression screening 05/20/2022   Avitaminosis D 01/17/2022   Genetic testing 01/08/2022   Malignant neoplasm of upper-outer quadrant of left breast in female, estrogen receptor positive (HCC) 11/27/2021   Osteopenia of multiple sites 11/15/2021   Abnormal mammogram 11/07/2021   LUQ abdominal pain 09/12/2021   Paresthesia of right foot 08/13/2021   B12 deficiency 11/20/2016   Clinical depression 07/12/2015   Elevation of level of transaminase or lactic acid dehydrogenase (LDH) 07/12/2015   GERD (gastroesophageal reflux disease) 07/12/2015   Chronic kidney disease, stage 3a (HCC) 07/12/2015   Obesity (BMI 30-39.9) 07/12/2015   Apnea, sleep 07/12/2015   Tension type headache 07/12/2015   Thoracic outlet syndrome 07/12/2015   Raynaud's phenomenon 12/15/2014   Leg cramps, sleep related 12/15/2014   Closed fracture of glenoid cavity of scapula 07/27/2014   Shoulder subluxation 07/27/2014   Posterior tibial tendinitis 07/03/2014   Weight gain 03/16/2014   Polycystic ovarian syndrome 03/16/2014    PCP: Sharma Coyer, MD  REFERRING PROVIDER: Lowery Estefana RAMAN, DO  REFERRING DIAG: Malignant neoplasm of upper-outer quadrant of left breast in female, estrogen receptor positive, Neuropathic pain, leg, right, Paresthesia of right foot, Symptomatic mammary hypertrophy, Chronic bilateral thoracic back pain, Neck pain  THERAPY DIAG:  Cervicalgia  Pain in thoracic spine  Neuralgia and neuritis  Rationale for Evaluation and Treatment: Rehabilitation  ONSET DATE: chronic over many years  SUBJECTIVE:                                                                                                                                                                                                          SUBJECTIVE STATEMENT: Patient states she came to PT after being referred by her plastic surgeon.   She went to her doctor because she asked about breast reduction after she had breast cancer. He sent her to Dr. Lowery the plastic surgeon, and she sent her to PT first. Her breasts have always been huge, she cannot find clothes that fit, she has lines in her shoulder from the bra straps, she gets terrible rashes that bleed, her arms go to sleep when she gets her bra supporting her breasts, she has acid reflux that her doctor said was worse because she wears her bras too tight.   Her neck is always too tight and hurts and she gets numbness down her arms when she lays on her back and when her bra straps are digging into the tops of her shoulder girdle. She gets a lot of pain in-between her shoulder blades. She feels like she has been having this pain all her life practically. She thought maybe she should not put up with it after she had the breast cancer surgery. She is bra size 38/40 DD. She does neck exercises, goes to a massage therapist, and she has gone to a chiropractor who did little adjustments and dry needling (it was expensive and did not make much difference, so she rarely goes).   Lumpectomy in the left breast in 2023.  She denies any injury to her neck. She tried to pop her husband's back about 2 months ago and had a sudden sharp pain at the left ribs. That is better now.   She is on a hormone suppressant. She did radiation, and surgery, no chemo.   Hand dominance: Right   PERTINENT HISTORY:  Patient is a 68 y.o. female who presents to outpatient physical therapy with a referral for medical diagnosis Malignant neoplasm of upper-outer quadrant of left breast in female, estrogen receptor positive, Neuropathic pain, leg, right, Paresthesia of right foot, Symptomatic mammary hypertrophy,  Chronic bilateral thoracic back pain, Neck pain with request to evaluate and treat for neck pain, back pain and mammary hypertrophy. This patient's chief complaints consist of neck and thoracici spine pain, bilateral axillary pain, intermittent R UE paresthesia leading to the following functional deficits: difficulty with working out, reading, sleeping, driving, lifting, recreation, playing tennis, being active, sweating, working, sitting at the computer. Relevant past medical history and comorbidities include the following: she has  Closed fracture of glenoid cavity of scapula; Polycystic ovarian syndrome; Posterior tibial tendinitis; Shoulder subluxation; Raynaud's phenomenon; Leg cramps, sleep related; Clinical depression; Elevation of level of transaminase or lactic acid dehydrogenase (LDH); GERD (gastroesophageal reflux disease); Chronic kidney disease, stage 3a (HCC); Obesity (BMI 30-39.9); Apnea, sleep; Tension type headache; Thoracic outlet syndrome; B12 deficiency; Paresthesia of right foot; LUQ abdominal pain; Malignant neoplasm of upper-outer quadrant of left breast in female, estrogen receptor positive (HCC); Avitaminosis D; Insomnia; Right foot pain; Acute cough; Positive depression screening; Osteopenia of multiple sites; Osteoporosis based one last BMD; Type 2 diabetes mellitus without complication, without long-term current use of insulin (HCC); Essential (primary) hypertension; Hyperlipidemia associated with type 2 diabetes mellitus (HCC); Neuropathic pain, leg, right; Symptomatic mammary hypertrophy; Back pain; and Neck pain on their problem list. she  has a past medical history of Anemia, Breast cancer (HCC) (2023), Depression, Dysrhythmia, GERD (gastroesophageal reflux disease), History of PCOS, Hypertension, Kidney disease, Personal history of radiation therapy, Pre-diabetes, and Sleep apnea. she  has a past surgical history that includes Dilation and curettage of uterus (06/30/2009); Cesarean  section; Diagnostic laparoscopy; Tubal ligation (Bilateral); Abdominal hysterectomy; Breast biopsy (Left, 11/18/2021); Breast lumpectomy (Left, 12/23/2021); Breast lumpectomy with needle localization (Left, 12/23/2021); and Axillary sentinel node biopsy (Left, 12/23/2021). Patient denies hx of stroke, seizures, lung problems, heart problems, diabetes, unexplained weight loss, unexplained changes in bowel or bladder problems, unexplained stumbling or dropping things, and spinal surgery  Exercise history:     PAIN: Are you having pain? Yes NPRS: Current: 3/10,  Best: 2-3/10, Worst: 8/10. Pain location: bilateral pecs, shoulders, under axillary region, over back of neck, over upper traps, over thoracic spine, numbness and tingling intermittently down right arm to hand.   Pain description: tightness, achy, paresthesia, occasional shooting down right arm Aggravating factors: laying on her back, bra straps digging in, reading, evenings after doing things the whole day.  Relieving factors: temporarily chiropractic, doing neck AROM, exercises, holding breasts up, wearing bra at night, getting up every hour to move around, mornings.   FUNCTIONAL LIMITATIONS: difficulty with working out, reading, sleeping, driving, lifting, recreation, playing tennis, being active, sweating, working, sitting at the computer  LEISURE: play tennis, dogs, exercising.   PRECAUTIONS: None  WEIGHT BEARING RESTRICTIONS: No  FALLS:  Has patient fallen in last 6 months? 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 Hyperkpyosis at upper thoracic spine with large breasts that appear to be pulling her thorax forwards into a slumped posture. Anthropometrics Tremor: none Body composition: Last BMI 29.7  Functional  Mobility Bed mobility: sit < > prone I Transfers: sit <> stand I Gait: grossly WFL for household and short community ambulation. More detailed gait analysis deferred to later date as needed.   NEUROLOGICAL Dermatomes C2-T1 appears equal and intact to light touch.  SPINE MOTION CERVICAL SPINE AROM *Indicates pain Flexion: 45 hears things crunching Extension: 40 feels good Side Flexion:   R 20 tightness on the left, something on the right that feels like don't continue  L 20 tightness on the right, kind of feels good Rotation:  R 60 feels crunchier and harder to do, feels tingly in the top of right shoulder L 64 easier and looser Protrusion: 0.75 inches Retraction: 0.25 inches   PERIPHERAL JOINT MOTION (in degrees) ACTIVE RANGE OF MOTION (AROM) Comments:  12/31/2023: B UE grossly WFL except limited in overhead bilaterally   MUSCLE PERFORMANCE (MMT):  *Indicates pain 12/30/23 Date Date  Joint/Motion R/L R/L R/L  Shoulder     Flexion 4*/4 / /  Abduction (C5) 4*/4 / /  External rotation 4+/4+ / /  Internal rotation 5/5 / /  Extension / / /  Periscapular     Upper Trap / / /  Middle Trap / / /  Lower Trap / / /  Elbow     Flexion (C6) 4+/4+ / /  Extension (C7) 4+/4+ / /  Hand     Thumb extension (C8) 4/4 / /  Finger abduction (T1) 4/4 / /  Comments:   SPECIAL TESTS: CERVICAL SPINE Cervical spine axial compression: negative Spurling's part B:  R = local pain, L = negative Cervical spine axial distraction: feels good but hurts at right neck  ACCESSORY MOTION: CPA to cervical segments and T1-T4 radiate pain R > L CPA to thoracic spine  hypomobile  PALPATION: TTP R > L UT/LS region   TREATMENT Therapeutic exercise: therapeutic exercises that incorporate ONE parameter at one or more areas of the body to centralize symptoms, develop strength and endurance, range of motion, and flexibility required for successful completion of functional activities.  Seated cervical spine retraction 1x10  Sidelying open book (thoracic rotation) to improve thoracic, shoulder girdle, and upper trunk mobility.  1x5 each side  Standing row with scapular retraction 1x10 with BlueTB  Education on diagnosis, prognosis, POC, anatomy and physiology of current condition.   Education on HEP including handout   Pt required multimodal cuing for proper technique and to facilitate improved neuromuscular control, strength, range of motion, and functional ability resulting in improved performance and form.    PATIENT EDUCATION:  Education details: Exercise purpose/form. Self management techniques. Education on diagnosis, prognosis, POC, anatomy and physiology of current condition. Education on HEP including handout. Person educated: Patient Education method: Explanation, Demonstration, and Handouts Education comprehension: verbalized understanding, returned demonstration, and needs further education  HOME EXERCISE PROGRAM: Access Code: [email or web address] URL: [email or web address] Date: 12/31/2023 Prepared by: Camie Cleverly  Exercises - Seated Cervical Retraction  - 15 reps - 1 second hold - every 2-3 hours while awake frequency - Standing Bilateral Low Shoulder Row with Anchored Resistance  - 1 x daily - 3 sets - 15 reps - Sidelying Thoracic Rotation with Open Book  - 1 x daily - 1-2 sets - 10 reps - 5 seconds hold  ASSESSMENT:  CLINICAL IMPRESSION: Patient is a 68 y.o. female referred to outpatient physical therapy with a medical diagnosis of Malignant neoplasm of upper-outer quadrant of left breast in female, estrogen receptor  positive, Neuropathic pain, leg, right, Paresthesia  of right foot, Symptomatic mammary hypertrophy, Chronic bilateral thoracic back pain, Neck pain with request to evaluate and treat for neck pain, back pain and mammary hypertrophy who presents with signs and symptoms consistent with chronic neck and thoracic spine pain with intermittent chronic R UE paresthesia. Patient presents with significant pain, posture, muscle performance (strength/power/endurance), motor control, ROM, paresthesia, and activity tolerance impairments that are limiting ability to complete her usual activities such as working out, reading, sleeping, driving, lifting, recreation, playing tennis, being active, sweating, working, sitting at the computer without difficulty. Her large breasts likely place excessive stress on her thoracic spine and neck, and pressure from her bra straps likely exacerbate the paresthesia in the right UE. PT is unable to decrease the load of her breast tissue, but she may benefit from a trial of PT to improve postural strength, joint mobility, and motor control to improve function and activity tolerance. Patient will benefit from skilled physical therapy intervention to address current body structure impairments and activity limitations to improve function and work towards goals set in current POC in order to return to prior level of function or maximal functional improvement.    OBJECTIVE IMPAIRMENTS: decreased activity tolerance, decreased coordination, decreased endurance, decreased knowledge of condition, decreased ROM, decreased strength, hypomobility, increased muscle spasms, impaired flexibility, impaired sensation, impaired UE functional use, postural dysfunction, and pain.   ACTIVITY LIMITATIONS: lifting, sitting, and sleeping  PARTICIPATION LIMITATIONS: community activity, occupation, and  difficulty with working out, reading, sleeping, driving, lifting, recreation, playing tennis, being active,  sweating, working, sitting at the computer  PERSONAL FACTORS: Fitness, Past/current experiences, Time since onset of injury/illness/exacerbation, and 3+ comorbidities:  Closed fracture of glenoid cavity of scapula; Polycystic ovarian syndrome; Posterior tibial tendinitis; Shoulder subluxation; Raynaud's phenomenon; Leg cramps, sleep related; Clinical depression; Elevation of level of transaminase or lactic acid dehydrogenase (LDH); GERD (gastroesophageal reflux disease); Chronic kidney disease, stage 3a (HCC); Obesity (BMI 30-39.9); Apnea, sleep; Tension type headache; Thoracic outlet syndrome; B12 deficiency; Paresthesia of right foot; LUQ abdominal pain; Malignant neoplasm of upper-outer quadrant of left breast in female, estrogen receptor positive (HCC); Avitaminosis D; Insomnia; Right foot pain; Acute cough; Positive depression screening; Osteopenia of multiple sites; Osteoporosis based one last BMD; Type 2 diabetes mellitus without complication, without long-term current use of insulin (HCC); Essential (primary) hypertension; Hyperlipidemia associated with type 2 diabetes mellitus (HCC); Neuropathic pain, leg, right; Symptomatic mammary hypertrophy; Back pain; and Neck pain on their problem list. she  has a past medical history of Anemia, Breast cancer (HCC) (2023), Depression, Dysrhythmia, GERD (gastroesophageal reflux disease), History of PCOS, Hypertension, Kidney disease, Personal history of radiation therapy, Pre-diabetes, and Sleep apnea. she  has a past surgical history that includes Dilation and curettage of uterus (06/30/2009); Cesarean section; Diagnostic laparoscopy; Tubal ligation (Bilateral); Abdominal hysterectomy; Breast biopsy (Left, 11/18/2021); Breast lumpectomy (Left, 12/23/2021); Breast lumpectomy with needle localization (Left, 12/23/2021); and Axillary sentinel node biopsy (Left, 12/23/2021) are also affecting patient's functional outcome.   REHAB POTENTIAL: Fair due to lack of ability  of PT to decrease breast size and abnormal load on front of chest they cause  CLINICAL DECISION MAKING: Stable/uncomplicated  EVALUATION COMPLEXITY: Low   GOALS: Goals reviewed with patient? No  SHORT TERM GOALS: Target date: 01/14/2024  Patient will be independent with initial home exercise program for self-management of symptoms. Baseline: Initial HEP provided at IE (12/31/23); Goal status: INITIAL  LONG TERM GOALS: Target date: 03/24/2024  Patient will be independent with a long-term home exercise program for  self-management of symptoms.  Baseline: Initial HEP provided at IE (12/31/23); Goal status: INITIAL  2.  Patient will demonstrate improved Neck Disability Index (NDI) to equal or less than 10% to demonstrate improvement in overall condition and self-reported functional ability.  Baseline: 32% (12/31/23); Goal status: INITIAL  3.  Patient will demonstrate R shoulder flexion and abduction strength equal or greater to L shoulder flexion and abduction without increased pain to improve her ability to play tennis.  Baseline: 4/5 and painful (12/31/23); Goal status: INITIAL  4.  Patient will demonstrate cervical spine AROM without reproduction of pain or paresthesia symptoms to improve her ability to complete daily tasks such as reading, working, and exercising.  Baseline: pain, tightness, and tingling with some movements (12/31/23); Goal status: INITIAL  5.  Patient will demonstrate improvement in Patient Specific Functional Scale (PSFS) of equal or greater than 8/10 points to reflect clinically significant improvement in patient's most valued functional activities. Baseline: to be measured at visit 2 as appropriate (12/31/23); Goal status: INITIAL  6.  Patient will report NPRS equal or less than 3/10 during functional activities during the last 2 weeks to improve their abilitly to complete community, work and/or recreational activities with less limitation. Baseline: 8/10  (12/31/23); Goal status: INITIAL    PLAN:  PT FREQUENCY: 1-2x/week  PT DURATION: 6-12 weeks  PLANNED INTERVENTIONS: 97164- PT Re-evaluation, 97750- Physical Performance Testing, 97110-Therapeutic exercises, 97530- Therapeutic activity, 97112- Neuromuscular re-education, 97535- Self Care, 02859- Manual therapy, [phone]- Aquatic Therapy, [id number]- Electrical stimulation (unattended), [phone] (1-2 muscles), 20561 (3+ muscles)- Dry Needling, Patient/Family education, Joint mobilization, Spinal mobilization, Cryotherapy, and Moist heat, education.   PLAN FOR NEXT SESSION: update HEP as appropriate, educate on mechanical stressors, exercises for improved posture mobility, motor control exercises for improved movement patterns, progressive strengthening as appropriate. Education. Manual therapy as needed.   Camie SAUNDERS. Juli, PT, DPT, Cert. MDT 12/31/23, 7:52 PM  [facility] [address] [place], [place] [address] P: [phone] I F: [phone]

## 2023-12-28 NOTE — Telephone Encounter (Signed)
 LOV 10/05/23 NOV 04/05/24 LRF 09/03/23 LABS 10/05/23

## 2023-12-31 ENCOUNTER — Ambulatory Visit

## 2023-12-31 ENCOUNTER — Encounter: Payer: Self-pay | Admitting: Physical Therapy

## 2023-12-31 ENCOUNTER — Inpatient Hospital Stay (HOSPITAL_BASED_OUTPATIENT_CLINIC_OR_DEPARTMENT_OTHER): Admitting: Oncology

## 2023-12-31 ENCOUNTER — Inpatient Hospital Stay: Attending: Nurse Practitioner

## 2023-12-31 ENCOUNTER — Ambulatory Visit: Attending: Plastic Surgery | Admitting: Physical Therapy

## 2023-12-31 ENCOUNTER — Encounter: Payer: Self-pay | Admitting: Oncology

## 2023-12-31 VITALS — BP 125/80 | HR 71 | Temp 96.3°F | Resp 19 | Ht 70.0 in | Wt 207.7 lb

## 2023-12-31 DIAGNOSIS — M85851 Other specified disorders of bone density and structure, right thigh: Secondary | ICD-10-CM

## 2023-12-31 DIAGNOSIS — M810 Age-related osteoporosis without current pathological fracture: Secondary | ICD-10-CM | POA: Diagnosis present

## 2023-12-31 DIAGNOSIS — C50412 Malignant neoplasm of upper-outer quadrant of left female breast: Secondary | ICD-10-CM | POA: Insufficient documentation

## 2023-12-31 DIAGNOSIS — N62 Hypertrophy of breast: Secondary | ICD-10-CM | POA: Diagnosis not present

## 2023-12-31 DIAGNOSIS — M792 Neuralgia and neuritis, unspecified: Secondary | ICD-10-CM | POA: Diagnosis present

## 2023-12-31 DIAGNOSIS — M542 Cervicalgia: Secondary | ICD-10-CM | POA: Insufficient documentation

## 2023-12-31 DIAGNOSIS — M546 Pain in thoracic spine: Secondary | ICD-10-CM | POA: Insufficient documentation

## 2023-12-31 DIAGNOSIS — Z170 Estrogen receptor positive status [ER+]: Secondary | ICD-10-CM | POA: Insufficient documentation

## 2023-12-31 DIAGNOSIS — Z79899 Other long term (current) drug therapy: Secondary | ICD-10-CM | POA: Diagnosis not present

## 2023-12-31 DIAGNOSIS — Z7983 Long term (current) use of bisphosphonates: Secondary | ICD-10-CM

## 2023-12-31 DIAGNOSIS — C50812 Malignant neoplasm of overlapping sites of left female breast: Secondary | ICD-10-CM | POA: Insufficient documentation

## 2023-12-31 DIAGNOSIS — Z79811 Long term (current) use of aromatase inhibitors: Secondary | ICD-10-CM | POA: Insufficient documentation

## 2023-12-31 DIAGNOSIS — Z5181 Encounter for therapeutic drug level monitoring: Secondary | ICD-10-CM | POA: Diagnosis not present

## 2023-12-31 DIAGNOSIS — E119 Type 2 diabetes mellitus without complications: Secondary | ICD-10-CM | POA: Diagnosis not present

## 2023-12-31 DIAGNOSIS — R202 Paresthesia of skin: Secondary | ICD-10-CM | POA: Insufficient documentation

## 2023-12-31 DIAGNOSIS — M8589 Other specified disorders of bone density and structure, multiple sites: Secondary | ICD-10-CM

## 2023-12-31 DIAGNOSIS — G8929 Other chronic pain: Secondary | ICD-10-CM | POA: Diagnosis not present

## 2023-12-31 LAB — CMP (CANCER CENTER ONLY)
ALT: 20 U/L (ref 0–44)
AST: 27 U/L (ref 15–41)
Albumin: 3.9 g/dL (ref 3.5–5.0)
Alkaline Phosphatase: 60 U/L (ref 38–126)
Anion gap: 6 (ref 5–15)
BUN: 17 mg/dL (ref 8–23)
CO2: 27 mmol/L (ref 22–32)
Calcium: 8.8 mg/dL — ABNORMAL LOW (ref 8.9–10.3)
Chloride: 103 mmol/L (ref 98–111)
Creatinine: 1.25 mg/dL — ABNORMAL HIGH (ref 0.44–1.00)
GFR, Estimated: 47 mL/min — ABNORMAL LOW (ref >60)
Glucose, Bld: 141 mg/dL — ABNORMAL HIGH (ref 70–99)
Potassium: 3.5 mmol/L (ref 3.5–5.1)
Sodium: 136 mmol/L (ref 135–145)
Total Bilirubin: 0.8 mg/dL (ref 0.0–1.2)
Total Protein: 6.8 g/dL (ref 6.5–8.1)

## 2023-12-31 LAB — CBC WITH DIFFERENTIAL (CANCER CENTER ONLY)
Abs Immature Granulocytes: 0.01 10*3/uL (ref 0.00–0.07)
Basophils Absolute: 0 10*3/uL (ref 0.0–0.1)
Basophils Relative: 1 %
Eosinophils Absolute: 0.1 10*3/uL (ref 0.0–0.5)
Eosinophils Relative: 3 %
HCT: 37 % (ref 36.0–46.0)
Hemoglobin: 12.5 g/dL (ref 12.0–15.0)
Immature Granulocytes: 0 %
Lymphocytes Relative: 27 %
Lymphs Abs: 1.2 10*3/uL (ref 0.7–4.0)
MCH: 31.3 pg (ref 26.0–34.0)
MCHC: 33.8 g/dL (ref 30.0–36.0)
MCV: 92.5 fL (ref 80.0–100.0)
Monocytes Absolute: 0.3 10*3/uL (ref 0.1–1.0)
Monocytes Relative: 7 %
Neutro Abs: 2.8 10*3/uL (ref 1.7–7.7)
Neutrophils Relative %: 62 %
Platelet Count: 176 10*3/uL (ref 150–400)
RBC: 4 MIL/uL (ref 3.87–5.11)
RDW: 12.5 % (ref 11.5–15.5)
WBC Count: 4.5 10*3/uL (ref 4.0–10.5)
nRBC: 0 % (ref 0.0–0.2)

## 2023-12-31 LAB — VITAMIN D 25 HYDROXY (VIT D DEFICIENCY, FRACTURES): Vit D, 25-Hydroxy: 39.01 ng/mL (ref 30–100)

## 2023-12-31 MED ORDER — SODIUM CHLORIDE 0.9 % IV SOLN
Freq: Once | INTRAVENOUS | Status: AC
  Filled 2023-12-31: qty 250

## 2023-12-31 MED ORDER — ZOLEDRONIC ACID 5 MG/100ML IV SOLN
5.0000 mg | INTRAVENOUS | Status: DC
  Administered 2023-12-31: 5 mg via INTRAVENOUS
  Filled 2023-12-31: qty 100

## 2023-12-31 NOTE — Patient Instructions (Signed)

## 2023-12-31 NOTE — Progress Notes (Signed)
 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  This was followed by diagnostic mammogram and ultrasound which showed architectural distortion in the lateral left breast at the 3 o'clock position.  There were a group of cysts measuring 7 x 2 x 3 mm.  Stereotactic biopsy of the architectural distortion was recommended.  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     Final pathology showed 8 mm grade 1 tumor with negative margins. 5 Sentinel lymph nodes negative for malignancy.  Genetic testing negative.patient completed adjuvant radiation and started letrozole  in sept 2023.  She did not require Oncotype testing.  Patient could not tolerate letrozole  and was subsequently switched to Arimidex    Baseline bone density scan does show osteopenia of the AP spine and right femur with a T-score of -2.  10-year probability of a major osteoporotic fracture 29.6% and hip fracture 2.8%.  Unable to tolerate Fosamax  due to reflux.  Bone density scan in April 2025 showed evidence of osteoporosis in the right femur  neck with worsening T-scores and therefore plan was to proceed with Reclast   Interval history-  Tolerating Arimidex  well without any significant side effects.  Denies any breast concerns presently   ECOG PS- 1 Pain scale- 0   Review of systems- Review of Systems  Constitutional:  Negative for chills, fever, malaise/fatigue and weight loss.  HENT:  Negative for congestion, ear discharge and nosebleeds.   Eyes:  Negative for blurred vision.  Respiratory:  Negative for cough, hemoptysis, sputum production, shortness of breath and wheezing.   Cardiovascular:  Negative for chest pain, palpitations, orthopnea and claudication.  Gastrointestinal:  Negative for abdominal pain, blood in stool, constipation, diarrhea, heartburn, melena, nausea and vomiting.  Genitourinary:  Negative for dysuria, flank pain, frequency, hematuria and urgency.  Musculoskeletal:  Negative for back pain, joint pain and myalgias.  Skin:  Negative for rash.  Neurological:  Negative for dizziness, tingling, focal weakness, seizures, weakness and headaches.  Endo/Heme/Allergies:  Does not bruise/bleed easily.  Psychiatric/Behavioral:  Negative for depression and suicidal ideas. The patient does not have insomnia.       No Known Allergies   Past Medical History:  Diagnosis Date   Anemia    Breast cancer (HCC) 2023   Depression    Dysrhythmia    racing heart- neg workup   GERD (gastroesophageal reflux disease)    History of PCOS    Hypertension    Kidney disease    44%  function   Personal history of radiation therapy    Pre-diabetes    Sleep apnea    inconclusive sleep study     Past Surgical History:  Procedure Laterality  Date   ABDOMINAL HYSTERECTOMY     AXILLARY SENTINEL NODE BIOPSY Left 12/23/2021   Procedure: AXILLARY SENTINEL NODE BIOPSY;  Surgeon: Dessa Reyes ORN, MD;  Location: [facility] ORS;  Service: General;  Laterality: Left;   BREAST BIOPSY Left 11/18/2021   lt br stereo, distortion, x  clip, [facility]   BREAST LUMPECTOMY Left 12/23/2021   [facility]   BREAST LUMPECTOMY WITH NEEDLE LOCALIZATION Left 12/23/2021   Procedure: BREAST LUMPECTOMY WITH NEEDLE LOCALIZATION;  Surgeon: Dessa Reyes ORN, MD;  Location: [facility] ORS;  Service: General;  Laterality: Left;   CESAREAN SECTION     DIAGNOSTIC LAPAROSCOPY     DILATION AND CURETTAGE OF UTERUS  06/30/2009   with hysteroecopy   TUBAL LIGATION Bilateral     Social History   Socioeconomic History   Marital status: Married    Spouse name: Not on file   Number of children: 2   Years of education: Not on file   Highest education level: Master's degree (e.g., MA, MS, MEng, MEd, MSW, MBA)  Occupational History    Employer: [employer]  Tobacco Use   Smoking status: Never   Smokeless tobacco: Never  Vaping Use   Vaping status: Never Used  Substance and Sexual Activity   Alcohol use: Yes    Comment: 2 drinks a month   Drug use: No   Sexual activity: Yes    Partners: Male  Other Topics Concern   Not on file  Social History Narrative   Not on file   Social Drivers of Health   Financial Resource Strain: Low Risk  (05/05/2023)   Overall Financial Resource Strain (CARDIA)    Difficulty of Paying Living Expenses: Not hard at all  Food Insecurity: No Food Insecurity (05/05/2023)   Hunger Vital Sign    Worried About Running Out of Food in the Last Year: Never true    Ran Out of Food in the Last Year: Never true  Transportation Needs: No Transportation Needs (05/05/2023)   PRAPARE - Administrator, Civil Service (Medical): No    Lack of Transportation (Non-Medical): No  Physical Activity: Insufficiently Active (05/05/2023)   Exercise Vital Sign    Days of Exercise per Week: 3 days    Minutes of Exercise per Session: 20 min  Stress: Stress Concern Present (05/05/2023)   [employer] of Occupational Health - Occupational Stress Questionnaire    Feeling of Stress : To some extent  Social Connections:  Moderately Integrated (05/05/2023)   Social Connection and Isolation Panel    Frequency of Communication with Friends and Family: Once a week    Frequency of Social Gatherings with Friends and Family: Once a week    Attends Religious Services: More than 4 times per year    Active Member of [employer] or Organizations: Yes    Attends Banker Meetings: 1 to 4 times per year    Marital Status: Married  Catering manager Violence: Not At Risk (10/05/2023)   Humiliation, Afraid, Rape, and Kick questionnaire    Fear of Current or Ex-Partner: No    Emotionally Abused: No    Physically Abused: No    Sexually Abused: No    Family History  Problem Relation Age of Onset   Breast cancer Mother 60   Osteoporosis Mother    Diabetes Father    Hypertension Father    Cancer Brother        cholangiocarcinoma   Breast cancer Maternal Aunt 46  Pancreatic cancer Maternal Uncle    Lung cancer Maternal Grandfather    Pancreatic cancer Paternal Grandmother    Cancer - Other Paternal Grandmother        cholangiocarcinoma     Current Outpatient Medications:    anastrozole  (ARIMIDEX ) 1 MG tablet, Take 1 tablet (1 mg total) by mouth daily., Disp: 90 tablet, Rfl: 1   CALCIUM-VITAMIN D  PO, Take by mouth., Disp: , Rfl:    citalopram  (CELEXA ) 20 MG tablet, TAKE 1 TABLET BY MOUTH DAILY, Disp: 90 tablet, Rfl: 1   cyanocobalamin  1000 MCG tablet, Take 1,000 mcg by mouth daily. (Patient not taking: Reported on 12/31/2023), Disp: , Rfl:    famotidine (PEPCID) 40 MG tablet, Take 1 tablet by mouth at bedtime., Disp: , Rfl:    FARXIGA 5 MG TABS tablet, Take 5 mg by mouth daily., Disp: , Rfl:    hydrochlorothiazide  (HYDRODIURIL ) 12.5 MG tablet, Take by mouth., Disp: , Rfl:    losartan  (COZAAR ) 25 MG tablet, Take 25 mg by mouth daily., Disp: , Rfl:    methylPREDNISolone  (MEDROL  DOSEPAK) 4 MG TBPK tablet, 6 day dose pack - take as directed (Patient not taking: Reported on 11/13/2023), Disp: 21 tablet, Rfl: 0    OZEMPIC , 1 MG/DOSE, 4 MG/3ML SOPN, DIAL AND INJECT UNDER THE SKIN 1 MG WEEKLY, Disp: 3 mL, Rfl: 3  Physical exam:  Vitals:   12/31/23 1342 12/31/23 1403  BP: 125/80   Pulse: 71   Resp: 19   Temp: (!) 96.3 F (35.7 C) (!) 96.3 F (35.7 C)  SpO2: 99%   Weight: 207 lb 11.2 oz (94.2 kg)   Height: 5' 10 (1.778 m)    Physical Exam Cardiovascular:     Rate and Rhythm: Normal rate and regular rhythm.     Heart sounds: Normal heart sounds.  Pulmonary:     Effort: Pulmonary effort is normal.     Breath sounds: Normal breath sounds.  Skin:    General: Skin is warm and dry.  Neurological:     Mental Status: She is alert and oriented to person, place, and time.      I have personally reviewed labs listed below:    Latest Ref Rng & Units 12/31/2023    1:45 PM  CMP  Glucose 70 - 99 mg/dL 858   BUN 8 - 23 mg/dL 17   Creatinine 9.55 - 1.00 mg/dL 8.74   Sodium 864 - 854 mmol/L 136   Potassium 3.5 - 5.1 mmol/L 3.5   Chloride 98 - 111 mmol/L 103   CO2 22 - 32 mmol/L 27   Calcium 8.9 - 10.3 mg/dL 8.8   Total Protein 6.5 - 8.1 g/dL 6.8   Total Bilirubin 0.0 - 1.2 mg/dL 0.8   Alkaline Phos 38 - 126 U/L 60   AST 15 - 41 U/L 27   ALT 0 - 44 U/L 20       Latest Ref Rng & Units 12/31/2023    1:45 PM  CBC  WBC 4.0 - 10.5 K/uL 4.5   Hemoglobin 12.0 - 15.0 g/dL 87.4   Hematocrit 63.9 - 46.0 % 37.0   Platelets 150 - 400 K/uL 176      Assessment and plan- Patient is a 68 y.o. 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  She is status postlumpectomy and adjuvant radiation therapy.  She is presently on arimidex  and here for start of reclast   Patient is tolerating arimidex  well without significant side effects. She was noted to have worsening bone density scores in April 2025 and progressed to osteoporosis. She was not able to tolerate fosamax  in the past. We are therefore proceeding with reclast  today  and she will receive it yearly. Calcium and renal functions acceptable to proceed with reclast  today. I will see her in 6 months no labs    Visit Diagnosis 1. Visit for monitoring Reclast  therapy   2. Osteoporosis of femur without pathological fracture   3. High risk medication use      Dr. Annah Skene, MD, MPH [facility] at [facility] [phone] 12/31/2023 2:35 PM

## 2024-01-01 ENCOUNTER — Encounter: Payer: Self-pay | Admitting: Oncology

## 2024-01-04 ENCOUNTER — Encounter: Payer: Self-pay | Admitting: Physical Therapy

## 2024-01-04 ENCOUNTER — Ambulatory Visit: Admitting: Physical Therapy

## 2024-01-04 DIAGNOSIS — M792 Neuralgia and neuritis, unspecified: Secondary | ICD-10-CM

## 2024-01-04 DIAGNOSIS — M542 Cervicalgia: Secondary | ICD-10-CM

## 2024-01-04 DIAGNOSIS — M546 Pain in thoracic spine: Secondary | ICD-10-CM

## 2024-01-04 NOTE — Therapy (Signed)
 OUTPATIENT PHYSICAL THERAPY CONTACT NOTE   Patient Name: Kathy Farmer MRN: [id number] DOB:July 27, 1955, 68 y.o., female Today's Date: 01/04/2024  END OF SESSION:  PT End of Session - 01/04/24 1743     Visit Number 0    Behavior During Therapy WFL for tasks assessed/performed           Past Medical History:  Diagnosis Date   Anemia    Breast cancer (HCC) 2023   Depression    Dysrhythmia    racing heart- neg workup   GERD (gastroesophageal reflux disease)    History of PCOS    Hypertension    Kidney disease    44%  function   Personal history of radiation therapy    Pre-diabetes    Sleep apnea    inconclusive sleep study   Past Surgical History:  Procedure Laterality Date   ABDOMINAL HYSTERECTOMY     AXILLARY SENTINEL NODE BIOPSY Left 12/23/2021   Procedure: AXILLARY SENTINEL NODE BIOPSY;  Surgeon: Dessa Reyes ORN, MD;  Location: [facility] ORS;  Service: General;  Laterality: Left;   BREAST BIOPSY Left 11/18/2021   lt br stereo, distortion, x clip, [facility]   BREAST LUMPECTOMY Left 12/23/2021   [facility]   BREAST LUMPECTOMY WITH NEEDLE LOCALIZATION Left 12/23/2021   Procedure: BREAST LUMPECTOMY WITH NEEDLE LOCALIZATION;  Surgeon: Dessa Reyes ORN, MD;  Location: [facility] ORS;  Service: General;  Laterality: Left;   CESAREAN SECTION     DIAGNOSTIC LAPAROSCOPY     DILATION AND CURETTAGE OF UTERUS  06/30/2009   with hysteroecopy   TUBAL LIGATION Bilateral    Patient Active Problem List   Diagnosis Date Noted   Symptomatic mammary hypertrophy 12/01/2023   Back pain 12/01/2023   Neck pain 12/01/2023   Hyperlipidemia associated with type 2 diabetes mellitus (HCC) 10/05/2023   Neuropathic pain, leg, right 10/05/2023   Type 2 diabetes mellitus without complication, without long-term current use of insulin (HCC) 12/30/2022   Essential (primary) hypertension 08/18/2022   Insomnia 05/20/2022   Right foot pain 05/20/2022   Acute cough 05/20/2022   Positive depression  screening 05/20/2022   Avitaminosis D 01/17/2022   Genetic testing 01/08/2022   Malignant neoplasm of upper-outer quadrant of left breast in female, estrogen receptor positive (HCC) 11/27/2021   Osteopenia of multiple sites 11/15/2021   Abnormal mammogram 11/07/2021   LUQ abdominal pain 09/12/2021   Paresthesia of right foot 08/13/2021   B12 deficiency 11/20/2016   Clinical depression 07/12/2015   Elevation of level of transaminase or lactic acid dehydrogenase (LDH) 07/12/2015   GERD (gastroesophageal reflux disease) 07/12/2015   Chronic kidney disease, stage 3a (HCC) 07/12/2015   Obesity (BMI 30-39.9) 07/12/2015   Apnea, sleep 07/12/2015   Tension type headache 07/12/2015   Thoracic outlet syndrome 07/12/2015   Raynaud's phenomenon 12/15/2014   Leg cramps, sleep related 12/15/2014   Closed fracture of glenoid cavity of scapula 07/27/2014   Shoulder subluxation 07/27/2014   Posterior tibial tendinitis 07/03/2014   Weight gain 03/16/2014   Polycystic ovarian syndrome 03/16/2014    PCP: Sharma Coyer, MD  REFERRING PROVIDER: Lowery Estefana RAMAN, DO   REFERRING DIAG: Malignant neoplasm of upper-outer quadrant of left breast in female, estrogen receptor positive, Neuropathic pain, leg, right, Paresthesia of right foot, Symptomatic mammary hypertrophy, Chronic bilateral thoracic back pain, Neck pain  THERAPY DIAG:  Cervicalgia  Pain in thoracic spine  Neuralgia and neuritis  Rationale for Evaluation and Treatment: Rehabilitation  ONSET DATE: chronic over many years  SUBJECTIVE:  PERTINENT HISTORY:  Patient is a 68 y.o. female who presents to outpatient physical therapy with a referral for medical diagnosis Malignant neoplasm of upper-outer quadrant of left breast in  female, estrogen receptor positive, Neuropathic pain, leg, right, Paresthesia of right foot, Symptomatic mammary hypertrophy, Chronic bilateral thoracic back pain, Neck pain with request to evaluate and treat for neck pain, back pain and mammary hypertrophy. This patient's chief complaints consist of neck and thoracici spine pain, bilateral axillary pain, intermittent R UE paresthesia leading to the following functional deficits: difficulty with working out, reading, sleeping, driving, lifting, recreation, playing tennis, being active, sweating, working, sitting at the computer. Relevant past medical history and comorbidities include the following: she has  Closed fracture of glenoid cavity of scapula; Polycystic ovarian syndrome; Posterior tibial tendinitis; Shoulder subluxation; Raynaud's phenomenon; Leg cramps, sleep related; Clinical depression; Elevation of level of transaminase or lactic acid dehydrogenase (LDH); GERD (gastroesophageal reflux disease); Chronic kidney disease, stage 3a (HCC); Obesity (BMI 30-39.9); Apnea, sleep; Tension type headache; Thoracic outlet syndrome; B12 deficiency; Paresthesia of right foot; LUQ abdominal pain; Malignant neoplasm of upper-outer quadrant of left breast in female, estrogen receptor positive (HCC); Avitaminosis D; Insomnia; Right foot pain; Acute cough; Positive depression screening; Osteopenia of multiple sites; Osteoporosis based one last BMD; Type 2 diabetes mellitus without complication, without long-term current use of insulin (HCC); Essential (primary) hypertension; Hyperlipidemia associated with type 2 diabetes mellitus (HCC); Neuropathic pain, leg, right; Symptomatic mammary hypertrophy; Back pain; and Neck pain on their problem list. she  has a past medical history of Anemia, Breast cancer (HCC) (2023), Depression, Dysrhythmia, GERD (gastroesophageal reflux disease), History of PCOS, Hypertension, Kidney disease, Personal history of radiation therapy,  Pre-diabetes, and Sleep apnea. she  has a past surgical history that includes Dilation and curettage of uterus (06/30/2009); Cesarean section; Diagnostic laparoscopy; Tubal ligation (Bilateral); Abdominal hysterectomy; Breast biopsy (Left, 11/18/2021); Breast lumpectomy (Left, 12/23/2021); Breast lumpectomy with needle localization (Left, 12/23/2021); and Axillary sentinel node biopsy (Left, 12/23/2021). Patient denies hx of stroke, seizures, lung problems, heart problems, diabetes, unexplained weight loss, unexplained changes in bowel or bladder problems, unexplained stumbling or dropping things, and spinal surgery  Hand dominance: Right She is on a hormone suppressant as long term breast cancer prevention.   Exercise history:   SUBJECTIVE STATEMENT: Patient states she feel stiffer today after doing her HEP since last PT session. She has decided not to proceed with getting a breast reduction after reviewing the risks with oncology. She does not wish to continue with PT at this time.   PAIN: NPRS: Current: 3/10 in the left upper trap region.   Best: 2-3/10, Worst: 8/10. Pain location: bilateral pecs, shoulders, under axillary region, over back of neck, over upper traps, over thoracic spine, numbness and tingling intermittently down right arm to hand.   Pain description: tightness, achy, paresthesia, occasional shooting down right arm Aggravating factors: laying on her back, bra straps digging in, reading, evenings after doing things the whole day.  Relieving factors: temporarily chiropractic, doing neck AROM, exercises, holding breasts up, wearing bra at night, getting up every hour to move around, mornings.   LEISURE: play tennis, dogs, exercising.   PRECAUTIONS: None  PATIENT GOALS: to now she can handle this on her own and will be fine or know she needs to get the surgery I would like to stand up straighter   NEXT MD VISIT: January 26, 2024  OBJECTIVE   PATIENT EDUCATION:  Education  details: Exercise purpose/form. Self management techniques. Education  on diagnosis, prognosis, POC, anatomy and physiology of current condition. Education on HEP including handout. Person educated: Patient Education method: Explanation, Demonstration, and Handouts Education comprehension: verbalized understanding, returned demonstration, and needs further education  HOME EXERCISE PROGRAM: Access Code: [email or web address] URL: [email or web address] Date: 12/31/2023 Prepared by: Camie Cleverly  Exercises - Seated Cervical Retraction  - 15 reps - 1 second hold - every 2-3 hours while awake frequency - Standing Bilateral Low Shoulder Row with Anchored Resistance  - 1 x daily - 3 sets - 15 reps - Sidelying Thoracic Rotation with Open Book  - 1 x daily - 1-2 sets - 10 reps - 5 seconds hold  ASSESSMENT:  CLINICAL IMPRESSION: No treatment completed today. Patient decided not to proceed with PT after she decided she did not want to pursue a breast reduction at this time due to the risks she discussed with oncology. Patient is now discharged from PT due to her lack of desire to continue.    OBJECTIVE IMPAIRMENTS: decreased activity tolerance, decreased coordination, decreased endurance, decreased knowledge of condition, decreased ROM, decreased strength, hypomobility, increased muscle spasms, impaired flexibility, impaired sensation, impaired UE functional use, postural dysfunction, and pain.   ACTIVITY LIMITATIONS: lifting, sitting, and sleeping  PARTICIPATION LIMITATIONS: community activity, occupation, and  difficulty with working out, reading, sleeping, driving, lifting, recreation, playing tennis, being active, sweating, working, sitting at the computer  PERSONAL FACTORS: Fitness, Past/current experiences, Time since onset of injury/illness/exacerbation, and 3+ comorbidities:  Closed fracture of glenoid cavity of scapula; Polycystic ovarian syndrome; Posterior tibial tendinitis; Shoulder  subluxation; Raynaud's phenomenon; Leg cramps, sleep related; Clinical depression; Elevation of level of transaminase or lactic acid dehydrogenase (LDH); GERD (gastroesophageal reflux disease); Chronic kidney disease, stage 3a (HCC); Obesity (BMI 30-39.9); Apnea, sleep; Tension type headache; Thoracic outlet syndrome; B12 deficiency; Paresthesia of right foot; LUQ abdominal pain; Malignant neoplasm of upper-outer quadrant of left breast in female, estrogen receptor positive (HCC); Avitaminosis D; Insomnia; Right foot pain; Acute cough; Positive depression screening; Osteopenia of multiple sites; Osteoporosis based one last BMD; Type 2 diabetes mellitus without complication, without long-term current use of insulin (HCC); Essential (primary) hypertension; Hyperlipidemia associated with type 2 diabetes mellitus (HCC); Neuropathic pain, leg, right; Symptomatic mammary hypertrophy; Back pain; and Neck pain on their problem list. she  has a past medical history of Anemia, Breast cancer (HCC) (2023), Depression, Dysrhythmia, GERD (gastroesophageal reflux disease), History of PCOS, Hypertension, Kidney disease, Personal history of radiation therapy, Pre-diabetes, and Sleep apnea. she  has a past surgical history that includes Dilation and curettage of uterus (06/30/2009); Cesarean section; Diagnostic laparoscopy; Tubal ligation (Bilateral); Abdominal hysterectomy; Breast biopsy (Left, 11/18/2021); Breast lumpectomy (Left, 12/23/2021); Breast lumpectomy with needle localization (Left, 12/23/2021); and Axillary sentinel node biopsy (Left, 12/23/2021) are also affecting patient's functional outcome.   REHAB POTENTIAL: Fair due to lack of ability of PT to decrease breast size and abnormal load on front of chest they cause  CLINICAL DECISION MAKING: Stable/uncomplicated  EVALUATION COMPLEXITY: Low   GOALS: Goals reviewed with patient? No  SHORT TERM GOALS: Target date: 01/14/2024  Patient will be independent with  initial home exercise program for self-management of symptoms. Baseline: Initial HEP provided at IE (12/31/23); Goal status: MET  LONG TERM GOALS: Target date: 03/24/2024  Patient will be independent with a long-term home exercise program for self-management of symptoms.  Baseline: Initial HEP provided at IE (12/31/23); Goal status: not met  2.  Patient will demonstrate improved Neck Disability Index (NDI) to equal or less than 10%  to demonstrate improvement in overall condition and self-reported functional ability.  Baseline: 32% (12/31/23); Goal status: not met  3.  Patient will demonstrate R shoulder flexion and abduction strength equal or greater to L shoulder flexion and abduction without increased pain to improve her ability to play tennis.  Baseline: 4/5 and painful (12/31/23); Goal status: not met  4.  Patient will demonstrate cervical spine AROM without reproduction of pain or paresthesia symptoms to improve her ability to complete daily tasks such as reading, working, and exercising.  Baseline: pain, tightness, and tingling with some movements (12/31/23); Goal status: not met  5.  Patient will demonstrate improvement in Patient Specific Functional Scale (PSFS) of equal or greater than 8/10 points to reflect clinically significant improvement in patient's most valued functional activities. Baseline: to be measured at visit 2 as appropriate (12/31/23); Goal status: not met  6.  Patient will report NPRS equal or less than 3/10 during functional activities during the last 2 weeks to improve their abilitly to complete community, work and/or recreational activities with less limitation. Baseline: 8/10 (12/31/23); Goal status: not met    PLAN:  PT FREQUENCY: 1-2x/week  PT DURATION: 6-12 weeks  PLANNED INTERVENTIONS: 97164- PT Re-evaluation, 97750- Physical Performance Testing, 97110-Therapeutic exercises, 97530- Therapeutic activity, 97112- Neuromuscular re-education, 97535-  Self Care, 02859- Manual therapy, [phone]- Aquatic Therapy, G0283- Electrical stimulation (unattended), [phone] (1-2 muscles), 20561 (3+ muscles)- Dry Needling, Patient/Family education, Joint mobilization, Spinal mobilization, Cryotherapy, and Moist heat, education.   PLAN FOR NEXT SESSION: patient is now discharged due to not wanting to continue PT  Quintarius Ferns R. Juli, PT, DPT, Cert. MDT 01/04/24, 5:46 PM  [facility] [address] [place], [place] [address] P: [phone] I F: [phone]

## 2024-01-07 ENCOUNTER — Ambulatory Visit: Admitting: Physical Therapy

## 2024-01-11 ENCOUNTER — Ambulatory Visit: Admitting: Physical Therapy

## 2024-01-14 ENCOUNTER — Encounter: Admitting: Physical Therapy

## 2024-01-14 ENCOUNTER — Encounter: Payer: Self-pay | Admitting: Oncology

## 2024-01-19 ENCOUNTER — Encounter: Admitting: Physical Therapy

## 2024-01-21 ENCOUNTER — Encounter: Admitting: Physical Therapy

## 2024-01-26 ENCOUNTER — Encounter: Admitting: Physical Therapy

## 2024-01-26 ENCOUNTER — Ambulatory Visit: Admitting: Surgical

## 2024-01-28 ENCOUNTER — Encounter: Admitting: Physical Therapy

## 2024-01-29 ENCOUNTER — Other Ambulatory Visit: Payer: Self-pay | Admitting: Oncology

## 2024-01-29 ENCOUNTER — Other Ambulatory Visit: Payer: Self-pay | Admitting: Family Medicine

## 2024-01-29 DIAGNOSIS — F324 Major depressive disorder, single episode, in partial remission: Secondary | ICD-10-CM

## 2024-01-29 NOTE — Telephone Encounter (Signed)
 LOV- 10/05/2023 NOV- 04/05/2024 LRF- Citalopram  HBR 20 MG tablet Dis: 90 tablet  Refill: 1

## 2024-02-01 ENCOUNTER — Encounter: Admitting: Physical Therapy

## 2024-02-04 ENCOUNTER — Encounter: Admitting: Physical Therapy

## 2024-02-08 ENCOUNTER — Encounter: Admitting: Physical Therapy

## 2024-02-11 ENCOUNTER — Encounter: Admitting: Physical Therapy

## 2024-02-16 ENCOUNTER — Encounter: Admitting: Physical Therapy

## 2024-02-18 ENCOUNTER — Encounter: Admitting: Physical Therapy

## 2024-02-23 ENCOUNTER — Encounter: Admitting: Physical Therapy

## 2024-02-23 ENCOUNTER — Institutional Professional Consult (permissible substitution): Admitting: Plastic Surgery

## 2024-03-03 ENCOUNTER — Encounter: Admitting: Physical Therapy

## 2024-03-17 ENCOUNTER — Encounter: Payer: Self-pay | Admitting: Family Medicine

## 2024-03-17 ENCOUNTER — Ambulatory Visit: Admitting: Family Medicine

## 2024-03-17 ENCOUNTER — Encounter: Payer: Self-pay | Admitting: Oncology

## 2024-03-17 VITALS — BP 116/78 | HR 69 | Temp 97.3°F | Ht 70.0 in | Wt 203.0 lb

## 2024-03-17 DIAGNOSIS — G479 Sleep disorder, unspecified: Secondary | ICD-10-CM

## 2024-03-17 DIAGNOSIS — M818 Other osteoporosis without current pathological fracture: Secondary | ICD-10-CM

## 2024-03-17 DIAGNOSIS — K219 Gastro-esophageal reflux disease without esophagitis: Secondary | ICD-10-CM

## 2024-03-17 DIAGNOSIS — F324 Major depressive disorder, single episode, in partial remission: Secondary | ICD-10-CM

## 2024-03-17 DIAGNOSIS — E1159 Type 2 diabetes mellitus with other circulatory complications: Secondary | ICD-10-CM | POA: Diagnosis not present

## 2024-03-17 DIAGNOSIS — E1122 Type 2 diabetes mellitus with diabetic chronic kidney disease: Secondary | ICD-10-CM

## 2024-03-17 DIAGNOSIS — N1831 Chronic kidney disease, stage 3a: Secondary | ICD-10-CM

## 2024-03-17 DIAGNOSIS — E559 Vitamin D deficiency, unspecified: Secondary | ICD-10-CM | POA: Diagnosis not present

## 2024-03-17 DIAGNOSIS — E663 Overweight: Secondary | ICD-10-CM

## 2024-03-17 DIAGNOSIS — G4709 Other insomnia: Secondary | ICD-10-CM

## 2024-03-17 DIAGNOSIS — E1169 Type 2 diabetes mellitus with other specified complication: Secondary | ICD-10-CM | POA: Diagnosis not present

## 2024-03-17 DIAGNOSIS — I10 Essential (primary) hypertension: Secondary | ICD-10-CM

## 2024-03-17 DIAGNOSIS — Z853 Personal history of malignant neoplasm of breast: Secondary | ICD-10-CM

## 2024-03-17 DIAGNOSIS — E538 Deficiency of other specified B group vitamins: Secondary | ICD-10-CM

## 2024-03-17 DIAGNOSIS — I152 Hypertension secondary to endocrine disorders: Secondary | ICD-10-CM

## 2024-03-17 MED ORDER — ESZOPICLONE 1 MG PO TABS: 1.0000 mg | ORAL_TABLET | Freq: Every evening | ORAL | 0 refills | Status: DC | PRN

## 2024-03-17 MED ORDER — OZEMPIC (2 MG/DOSE) 8 MG/3ML ~~LOC~~ SOPN: 2.0000 mg | PEN_INJECTOR | SUBCUTANEOUS | 3 refills | Status: DC

## 2024-03-17 NOTE — Patient Instructions (Signed)
 To keep you healthy, please keep in mind the following health maintenance items that you are due for:   Health Maintenance Due  Topic Date Due   OPHTHALMOLOGY EXAM  Never done   Zoster Vaccines- Shingrix (1 of 2) Never done     Best Wishes,   Dr. Lang

## 2024-03-17 NOTE — Progress Notes (Signed)
 Established patient visit   Patient: Kathy Farmer   DOB: 01-07-1956   68 y.o. Female  MRN: [id number] Visit Date: 03/17/2024  Today's healthcare provider: Rockie Agent, MD   Chief Complaint  Patient presents with   Medical Management of Chronic Issues    Patient is present for 6 month follow.  Patient has been doing okay, reports that she is having trouble sleeping. Trouble with falling and staying asleep x 2-3 months.     Weight Check    Patient believes she may have hit a plateau with the ozempic  as far as weight loss. States she ha shad great numbers with  kidney function and A1c   Subjective     HPI     Medical Management of Chronic Issues    Additional comments: Patient is present for 6 month follow.  Patient has been doing okay, reports that she is having trouble sleeping. Trouble with falling and staying asleep x 2-3 months.          Weight Check    Additional comments: Patient believes she may have hit a plateau with the ozempic  as far as weight loss. States she ha shad great numbers with  kidney function and A1c      Last edited by Cherry Chiquita HERO, CMA on 03/17/2024 10:54 AM.       Discussed the use of AI scribe software for clinical note transcription with the patient, who gave verbal consent to proceed.  History of Present Illness Kathy Farmer is a 68 year old female with chronic kidney disease and type 2 diabetes who presents for a chronic condition follow-up.  Her A1c is well-controlled at 5.5, and her kidney function has improved. She is currently on semaglutide , losartan  25 mg, Farxiga 5 mg, and hydrochlorothiazide  12.5 mg. Her last GFR was 47 in July, with a creatinine level of 1.25.  She has experienced significant weight loss, starting from a high of 236-240 pounds a couple of years ago to the low 200s now, but mentions a plateau in her weight loss journey. She has recently started working with a  Psychologist, educational and walks daily. Her diet is reportedly good.  She experiences chronic acid reflux and takes famotidine 40 mg as needed, noting improvement when she avoids eating after 5 PM.  She struggles with sleep, having difficulty both falling and staying asleep. She has tried Tylenol  PM, meditation, Unisom, and trazodone  20 mg, with trazodone  providing some relief. She is cautious about medications that might affect her kidneys.  She continues to take vitamin B12 1000 mcg and calcium supplements due to a previous osteoporosis infusion. Her B12 level was 329 in April.  Her blood pressure was 116/78 at the last check.  She has follow up scheduled with oncology (Jan 2026) for her hx of breast cancer, currently on anastrozole       Past Medical History:  Diagnosis Date   Anemia    Breast cancer (HCC) 2023   Closed fracture of glenoid cavity of scapula 07/27/2014   Depression    Dysrhythmia    racing heart- neg workup   GERD (gastroesophageal reflux disease)    History of PCOS    Hypertension    Kidney disease    44%  function   Personal history of radiation therapy    Posterior tibial tendinitis 07/03/2014   Pre-diabetes    Sleep apnea    inconclusive sleep study    Medications: Outpatient Medications Prior to Visit  Medication Sig   anastrozole  (ARIMIDEX ) 1 MG tablet TAKE 1 TABLET BY MOUTH DAILY   CALCIUM-VITAMIN D  PO Take by mouth.   citalopram  (CELEXA ) 20 MG tablet TAKE 1 TABLET BY MOUTH DAILY   cyanocobalamin  1000 MCG tablet Take 1,000 mcg by mouth daily.   famotidine (PEPCID) 40 MG tablet Take 1 tablet by mouth at bedtime.   FARXIGA 5 MG TABS tablet Take 5 mg by mouth daily.   hydrochlorothiazide  (HYDRODIURIL ) 12.5 MG tablet Take 12.5 mg by mouth daily.   losartan  (COZAAR ) 25 MG tablet Take 25 mg by mouth daily.   [DISCONTINUED] OZEMPIC , 1 MG/DOSE, 4 MG/3ML SOPN DIAL AND INJECT UNDER THE SKIN 1 MG WEEKLY   [DISCONTINUED] methylPREDNISolone  (MEDROL  DOSEPAK) 4 MG TBPK  tablet 6 day dose pack - take as directed (Patient not taking: Reported on 11/13/2023)   No facility-administered medications prior to visit.    Review of Systems  Last metabolic panel Lab Results  Component Value Date   GLUCOSE 141 (H) 12/31/2023   NA 136 12/31/2023   K 3.5 12/31/2023   CL 103 12/31/2023   CO2 27 12/31/2023   BUN 17 12/31/2023   CREATININE 1.25 (H) 12/31/2023   GFRNONAA 47 (L) 12/31/2023   CALCIUM 8.8 (L) 12/31/2023   PROT 6.8 12/31/2023   ALBUMIN 3.9 12/31/2023   LABGLOB 2.3 09/25/2021   AGRATIO 1.8 09/25/2021   BILITOT 0.8 12/31/2023   ALKPHOS 60 12/31/2023   AST 27 12/31/2023   ALT 20 12/31/2023   ANIONGAP 6 12/31/2023   Last lipids Lab Results  Component Value Date   CHOL 159 10/05/2023   HDL 36 (L) 10/05/2023   LDLCALC 99 10/05/2023   TRIG 132 10/05/2023   CHOLHDL 4.4 10/05/2023   Last hemoglobin A1c Lab Results  Component Value Date   HGBA1C 5.5 10/05/2023   Last thyroid  functions Lab Results  Component Value Date   TSH 1.060 10/05/2023   T3TOTAL 104 12/15/2014     Lab Results  Component Value Date   VITAMINB12 329 10/05/2023       Objective    BP 116/78 (BP Location: Right Arm, Patient Position: Sitting, Cuff Size: Normal)   Pulse 69   Temp (!) 97.3 F (36.3 C) (Oral)   Ht 5' 10 (1.778 m)   Wt 203 lb (92.1 kg)   LMP 01/24/2011   SpO2 97%   BMI 29.13 kg/m   BP Readings from Last 3 Encounters:  03/17/24 116/78  12/31/23 125/80  12/01/23 100/69   Wt Readings from Last 3 Encounters:  03/17/24 203 lb (92.1 kg)  12/31/23 207 lb 11.2 oz (94.2 kg)  12/01/23 206 lb (93.4 kg)        Physical Exam Vitals reviewed.  Constitutional:      General: She is not in acute distress.    Appearance: Normal appearance. She is not ill-appearing.  Cardiovascular:     Rate and Rhythm: Normal rate and regular rhythm.  Pulmonary:     Effort: Pulmonary effort is normal. No respiratory distress.     Breath sounds: No wheezing,  rhonchi or rales.  Neurological:     Mental Status: She is alert and oriented to person, place, and time.  Psychiatric:        Mood and Affect: Mood normal.        Behavior: Behavior normal.        No results found for any visits on 03/17/24.  Assessment & Plan     Problem List Items  Addressed This Visit     Avitaminosis D   B12 deficiency   Chronic condition  Continue 1000mcg supplement daily       Chronic kidney disease, stage 3a (HCC)    Chronic kidney disease, stage 3a Associated with DM  Chronic kidney disease stage 3a with a GFR of 47 and creatinine of 1.25 as of July. Nephrologist recommends continuation of current medications for renal protection. - Continue losartan  25 mg daily. - Continue Farxiga 5 mg daily for renal protection. - Plan for nephrologist to conduct blood work in Dec      Clinical depression   GERD (gastroesophageal reflux disease)   Gastroesophageal reflux disease (GERD) Chronic condition  GERD managed with famotidine 40 mg as needed. Symptoms improve with dietary modifications such as not eating after 5 PM. - Continue famotidine 40 mg as needed. - Advise to continue dietary modifications to manage symptoms.      Hyperlipidemia associated with type 2 diabetes mellitus (HCC)   Chronic condition  Not currently on statin therapy  Will need to follow up on this during next OV       Relevant Medications   hydrochlorothiazide  (HYDRODIURIL ) 12.5 MG tablet   Semaglutide , 2 MG/DOSE, (OZEMPIC , 2 MG/DOSE,) 8 MG/3ML SOPN   Hypertension associated with diabetes (HCC) - Primary    Essential hypertension Chronic condition Essential hypertension is well-controlled with a blood pressure of 116/78 mmHg. - Continue losartan  25 mg daily.      Relevant Medications   hydrochlorothiazide  (HYDRODIURIL ) 12.5 MG tablet   Semaglutide , 2 MG/DOSE, (OZEMPIC , 2 MG/DOSE,) 8 MG/3ML SOPN   Insomnia   Chronic insomnia with difficulty initiating and maintaining  sleep. Previous trials of Tylenol  PM, meditation, Unisom, and trazodone  with limited success. Lunesta  is considered a safer option for her kidneys compared to other sleep aids like Ambien. - Initiate Lunesta  1 mg at bedtime to improve sleep. - Schedule follow-up in mid to late October to assess effectiveness of Lunesta       Osteoporosis   Osteoporosis (post-infusion) Chronic condition  DEXA 09/2023 with T score -2.5  Osteoporosis managed post-infusion with calcium supplementation. - Continue calcium supplementation.      Overweight (BMI 25.0-29.9)   Chronic Condition  Weight category has changed from Obesity to overweight category with weight loss from 236-240 lbs to low 200s. Expected weight loss goal is 20-40 lbs, approximately 15-20% of initial weight. - Increase semaglutide  (Ozempic ) to 2 mg per week to aid further weight loss. - Continue physical activity regimen including cardio, weights, and daily walking.      Personal history of breast cancer   Follows with oncology  Continue Anastrazole 1mg  daily       Sleep disturbance   Relevant Medications   eszopiclone  (LUNESTA ) 1 MG TABS tablet   Type 2 diabetes mellitus without complication, without long-term current use of insulin (HCC)   Type 2 diabetes mellitus Chronic condition  Type 2 diabetes mellitus is well-controlled with an A1c of 5.5%. - Continue current diabetes management regimen including semaglutide . - Increase semaglutide  (Ozempic ) to 2 mg per week for additional weight loss. - Monitor for side effects such as diarrhea, vomiting, or dehydration and withhold medication if these occur.      Relevant Medications   Semaglutide , 2 MG/DOSE, (OZEMPIC , 2 MG/DOSE,) 8 MG/3ML SOPN     Assessment & Plan    Return in about 6 weeks (around 04/28/2024) for sleep .         Rockie Agent, MD  [facility] [phone] (phone) [phone] (fax)  [facility]

## 2024-03-20 ENCOUNTER — Encounter: Payer: Self-pay | Admitting: Family Medicine

## 2024-03-20 ENCOUNTER — Encounter: Payer: Self-pay | Admitting: Oncology

## 2024-03-20 DIAGNOSIS — Z853 Personal history of malignant neoplasm of breast: Secondary | ICD-10-CM | POA: Insufficient documentation

## 2024-03-20 NOTE — Assessment & Plan Note (Signed)
 Follows with oncology  Continue Anastrazole 1mg  daily

## 2024-03-20 NOTE — Assessment & Plan Note (Signed)
 Chronic Condition  Weight category has changed from Obesity to overweight category with weight loss from 236-240 lbs to low 200s. Expected weight loss goal is 20-40 lbs, approximately 15-20% of initial weight. - Increase semaglutide  (Ozempic ) to 2 mg per week to aid further weight loss. - Continue physical activity regimen including cardio, weights, and daily walking.

## 2024-03-20 NOTE — Assessment & Plan Note (Signed)
  Chronic kidney disease, stage 3a Associated with DM  Chronic kidney disease stage 3a with a GFR of 47 and creatinine of 1.25 as of July. Nephrologist recommends continuation of current medications for renal protection. - Continue losartan  25 mg daily. - Continue Farxiga 5 mg daily for renal protection. - Plan for nephrologist to conduct blood work in Dec

## 2024-03-20 NOTE — Assessment & Plan Note (Signed)
 Chronic insomnia with difficulty initiating and maintaining sleep. Previous trials of Tylenol  PM, meditation, Unisom, and trazodone  with limited success. Lunesta  is considered a safer option for her kidneys compared to other sleep aids like Ambien. - Initiate Lunesta  1 mg at bedtime to improve sleep. - Schedule follow-up in mid to late October to assess effectiveness of Lunesta 

## 2024-03-20 NOTE — Assessment & Plan Note (Signed)
 Type 2 diabetes mellitus Chronic condition  Type 2 diabetes mellitus is well-controlled with an A1c of 5.5%. - Continue current diabetes management regimen including semaglutide . - Increase semaglutide  (Ozempic ) to 2 mg per week for additional weight loss. - Monitor for side effects such as diarrhea, vomiting, or dehydration and withhold medication if these occur.

## 2024-03-20 NOTE — Assessment & Plan Note (Signed)
 Gastroesophageal reflux disease (GERD) Chronic condition  GERD managed with famotidine 40 mg as needed. Symptoms improve with dietary modifications such as not eating after 5 PM. - Continue famotidine 40 mg as needed. - Advise to continue dietary modifications to manage symptoms.

## 2024-03-20 NOTE — Assessment & Plan Note (Signed)
 Chronic condition  Not currently on statin therapy  Will need to follow up on this during next OV

## 2024-03-20 NOTE — Assessment & Plan Note (Signed)
 Osteoporosis (post-infusion) Chronic condition  DEXA 09/2023 with T score -2.5  Osteoporosis managed post-infusion with calcium supplementation. - Continue calcium supplementation.

## 2024-03-20 NOTE — Assessment & Plan Note (Signed)
 Chronic condition  Continue 1000mcg supplement daily

## 2024-03-20 NOTE — Assessment & Plan Note (Signed)
  Essential hypertension Chronic condition Essential hypertension is well-controlled with a blood pressure of 116/78 mmHg. - Continue losartan  25 mg daily.

## 2024-04-01 ENCOUNTER — Ambulatory Visit

## 2024-04-01 ENCOUNTER — Other Ambulatory Visit: Payer: Self-pay | Admitting: Physician Assistant

## 2024-04-01 NOTE — Progress Notes (Signed)
 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   Orthotics were dispensed and fit was satisfactory. Reviewed instructions for break-in and wear. Written instructions given to patient.  Patient will follow up as needed.   Lolita Schultze CPed, Cfo, CFm

## 2024-04-05 ENCOUNTER — Ambulatory Visit: Admitting: Family Medicine

## 2024-04-14 ENCOUNTER — Encounter: Payer: Self-pay | Admitting: Radiation Oncology

## 2024-04-14 ENCOUNTER — Ambulatory Visit
Admission: RE | Admit: 2024-04-14 | Discharge: 2024-04-14 | Disposition: A | Discharge status: Home / Self Care | Payer: BLUE CROSS/BLUE SHIELD | Source: Ambulatory Visit | Attending: Radiation Oncology | Admitting: Radiation Oncology

## 2024-04-14 VITALS — BP 114/71 | HR 78 | Temp 98.0°F | Resp 16 | Ht 70.0 in | Wt 201.9 lb

## 2024-04-14 DIAGNOSIS — Z79811 Long term (current) use of aromatase inhibitors: Secondary | ICD-10-CM | POA: Insufficient documentation

## 2024-04-14 DIAGNOSIS — C50412 Malignant neoplasm of upper-outer quadrant of left female breast: Secondary | ICD-10-CM | POA: Insufficient documentation

## 2024-04-14 DIAGNOSIS — Z923 Personal history of irradiation: Secondary | ICD-10-CM | POA: Insufficient documentation

## 2024-04-14 DIAGNOSIS — Z170 Estrogen receptor positive status [ER+]: Secondary | ICD-10-CM | POA: Insufficient documentation

## 2024-04-14 NOTE — Progress Notes (Signed)
 Radiation Oncology Follow up Note  Name: Kathy Farmer   Date:   04/14/2024 MRN:  [id number] DOB: [birth year]    This 68 y.o. 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  Seen today in routine follow-up she is doing well specifically denies breast tenderness cough or bone pain..  She had mammograms back in April which I have reviewed were BI-RADS 2 benign.  She is currently on Arimidex  tolerating that well without side effect.  COMPLICATIONS OF TREATMENT: none  FOLLOW UP COMPLIANCE: keeps appointments   PHYSICAL EXAM:  BP 114/71   Pulse 78   Temp 98 F (36.7 C)   Resp 16   Ht 5' 10 (1.778 m)   Wt 201 lb 14.4 oz (91.6 kg)   LMP 01/24/2011   BMI 28.97 kg/m  Lungs are clear to A&P cardiac examination essentially unremarkable with regular rate and rhythm. No dominant mass or nodularity is noted in either breast in 2 positions examined. Incision is well-healed. No axillary or supraclavicular adenopathy is appreciated. Cosmetic result is excellent.  Well-developed well-nourished patient in NAD. HEENT reveals PERLA, EOMI, discs not visualized.  Oral cavity is clear. No oral mucosal lesions are identified. Neck is clear without evidence of cervical or supraclavicular adenopathy. Lungs are clear to A&P. Cardiac examination is essentially unremarkable with regular rate and rhythm without murmur rub or thrill. Abdomen is benign with no organomegaly or masses noted. Motor sensory and DTR levels are equal and symmetric in the upper and lower extremities. Cranial nerves II through XII are grossly intact. Proprioception is intact. No peripheral adenopathy or  edema is identified. No motor or sensory levels are noted. Crude visual fields are within normal range.  RADIOLOGY RESULTS: Mammograms reviewed compatible with above-stated findings  PLAN: At the present time patient is doing well out over 2 years with no evidence of disease.  I am going to turn follow-up care over to medical oncology.  I be happy to reevaluate the patient in time the future should that be indicated.  Patient knows to call with any concerns.  I would like to take this opportunity to thank you for allowing me to participate in the care of your patient.SABRA Marcey Penton, MD

## 2024-04-26 ENCOUNTER — Encounter: Payer: Self-pay | Admitting: Family Medicine

## 2024-04-26 ENCOUNTER — Other Ambulatory Visit: Payer: Self-pay

## 2024-04-26 DIAGNOSIS — E1122 Type 2 diabetes mellitus with diabetic chronic kidney disease: Secondary | ICD-10-CM

## 2024-04-26 MED ORDER — OZEMPIC (2 MG/DOSE) 8 MG/3ML ~~LOC~~ SOPN
2.0000 mg | PEN_INJECTOR | SUBCUTANEOUS | 3 refills | Status: AC
Start: 2024-04-26 — End: ?

## 2024-04-29 ENCOUNTER — Encounter: Payer: Self-pay | Admitting: Oncology

## 2024-05-17 ENCOUNTER — Telehealth (INDEPENDENT_AMBULATORY_CARE_PROVIDER_SITE_OTHER): Admitting: Family Medicine

## 2024-05-17 ENCOUNTER — Encounter: Payer: Self-pay | Admitting: Family Medicine

## 2024-05-17 VITALS — Wt 189.0 lb

## 2024-05-17 DIAGNOSIS — G4709 Other insomnia: Secondary | ICD-10-CM | POA: Diagnosis not present

## 2024-05-17 DIAGNOSIS — E119 Type 2 diabetes mellitus without complications: Secondary | ICD-10-CM

## 2024-05-17 DIAGNOSIS — Z7985 Long-term (current) use of injectable non-insulin antidiabetic drugs: Secondary | ICD-10-CM

## 2024-05-17 MED ORDER — CLONAZEPAM 0.5 MG PO TABS: 0.5000 mg | ORAL_TABLET | Freq: Every evening | ORAL | 1 refills | Status: AC | PRN

## 2024-05-17 NOTE — Assessment & Plan Note (Signed)
 Insomnia Chronic insomnia with previous trials of Lunesta , Tylenol  PM, meditation, Unisom, and trazodone  with limited success. Lunesta  caused nightmares and headaches. She prefers a medication for occasional use rather than nightly use due to concerns about habit formation. - Prescribed clonazepam 0.5 mg as needed for sleep, to be taken within 30 minutes of bedtime.

## 2024-05-17 NOTE — Patient Instructions (Signed)
 To keep you healthy, please keep in mind the following health maintenance items that you are due for:   Health Maintenance Due  Topic Date Due   OPHTHALMOLOGY EXAM  Never done   Zoster Vaccines- Shingrix (1 of 2) Never done   COVID-19 Vaccine (5 - 2025-26 season) 02/29/2024   HEMOGLOBIN A1C  04/05/2024     Best Wishes,   Dr. Lang

## 2024-05-17 NOTE — Progress Notes (Signed)
 MyChart Video Visit    Virtual Visit via Video Note   This format is felt to be most appropriate for this patient at this time. Physical exam was limited by quality of the video and audio technology used for the visit.   Patient location: Patient's home address   Provider location: [facility]  [address], [address]  [place], [place] [address]   I discussed the limitations of evaluation and management by telemedicine and the availability of in person appointments. The patient expressed understanding and agreed to proceed.  Patient: Kathy Farmer   DOB: 05/02/1956   68 y.o. Female  MRN: [id number] Visit Date: 05/17/2024  Today's healthcare provider: Rockie Agent, MD   No chief complaint on file.  Subjective    HPI   Discussed the use of AI scribe software for clinical note transcription with the patient, who gave verbal consent to proceed.  History of Present Illness Kathy Farmer is a 68 year old female who presents with insomnia.  She experiences chronic insomnia and has tried various treatments including Tylenol  PM, meditation, Unisom, and trazodone  without much success. She was previously started on Lunesta  1 mg per night but discontinued it due to experiencing nightmares and waking up with a headache. While Lunesta  helped her sleep, the side effects were intolerable. She has also tried hydroxyzine  for sleep without benefit.  She has chronic type 2 diabetes and is currently treated with semaglutide  (Ozempic ). Her dose was increased to 2 mg, which initially worsened her reflux symptoms. She has since adjusted to the 2 mg dose. Her reflux symptoms have settled down somewhat, occurring primarily in the first two to three days after taking the shot. She notes a slight weight loss, estimating a reduction of about 4 to 5 pounds since October, with her current weight being 189 pounds.      Past Medical History:   Diagnosis Date   Anemia    Breast cancer (HCC) 2023   Closed fracture of glenoid cavity of scapula 07/27/2014   Depression    Dysrhythmia    racing heart- neg workup   GERD (gastroesophageal reflux disease)    History of PCOS    Hypertension    Kidney disease    44%  function   Personal history of radiation therapy    Posterior tibial tendinitis 07/03/2014   Pre-diabetes    Sleep apnea    inconclusive sleep study       03/17/2024   10:56 AM 12/31/2023    2:00 PM 05/06/2023    9:49 AM  PHQ9 SCORE ONLY  PHQ-9 Total Score 8 0  8      Data saved with a previous flowsheet row definition       03/17/2024   10:56 AM 05/06/2023    9:50 AM 08/19/2022    4:22 PM  GAD 7 : Generalized Anxiety Score  Nervous, Anxious, on Edge 2 1 3   Control/stop worrying 2 1 3   Worry too much - different things 2 1 2   Trouble relaxing 2 1 2   Restless 2 1 2   Easily annoyed or irritable 0 1 1  Afraid - awful might happen 0 1 2  Total GAD 7 Score 10 7 15   Anxiety Difficulty Somewhat difficult Somewhat difficult Not difficult at all     Medications: Outpatient Medications Prior to Visit  Medication Sig Note   anastrozole  (ARIMIDEX ) 1 MG tablet TAKE 1 TABLET BY MOUTH DAILY    CALCIUM-VITAMIN  D PO Take by mouth.    citalopram  (CELEXA ) 20 MG tablet TAKE 1 TABLET BY MOUTH DAILY    cyanocobalamin  1000 MCG tablet Take 1,000 mcg by mouth daily.    famotidine (PEPCID) 40 MG tablet Take 1 tablet by mouth at bedtime.    FARXIGA 5 MG TABS tablet Take 5 mg by mouth daily.    hydrochlorothiazide  (HYDRODIURIL ) 12.5 MG tablet Take 12.5 mg by mouth daily.    losartan  (COZAAR ) 25 MG tablet Take 25 mg by mouth daily.    Semaglutide , 2 MG/DOSE, (OZEMPIC , 2 MG/DOSE,) 8 MG/3ML SOPN Inject 2 mg into the skin once a week.    [DISCONTINUED] eszopiclone  (LUNESTA ) 1 MG TABS tablet Take 1 tablet (1 mg total) by mouth at bedtime as needed for sleep. Take immediately before bedtime 05/17/2024: side effects   No  facility-administered medications prior to visit.    Review of Systems  Last CBC Lab Results  Component Value Date   WBC 4.5 12/31/2023   HGB 12.5 12/31/2023   HCT 37.0 12/31/2023   MCV 92.5 12/31/2023   MCH 31.3 12/31/2023   RDW 12.5 12/31/2023   PLT 176 12/31/2023   Last metabolic panel Lab Results  Component Value Date   GLUCOSE 141 (H) 12/31/2023   NA 136 12/31/2023   K 3.5 12/31/2023   CL 103 12/31/2023   CO2 27 12/31/2023   BUN 17 12/31/2023   CREATININE 1.25 (H) 12/31/2023   GFRNONAA 47 (L) 12/31/2023   CALCIUM 8.8 (L) 12/31/2023   PROT 6.8 12/31/2023   ALBUMIN 3.9 12/31/2023   LABGLOB 2.3 09/25/2021   AGRATIO 1.8 09/25/2021   BILITOT 0.8 12/31/2023   ALKPHOS 60 12/31/2023   AST 27 12/31/2023   ALT 20 12/31/2023   ANIONGAP 6 12/31/2023   Last lipids Lab Results  Component Value Date   CHOL 159 10/05/2023   HDL 36 (L) 10/05/2023   LDLCALC 99 10/05/2023   TRIG 132 10/05/2023   CHOLHDL 4.4 10/05/2023   Last hemoglobin A1c Lab Results  Component Value Date   HGBA1C 5.5 10/05/2023   Last thyroid  functions Lab Results  Component Value Date   TSH 1.060 10/05/2023   T3TOTAL 104 12/15/2014   FREET4 1.10 10/05/2023   Last vitamin D  Lab Results  Component Value Date   VD25OH 39.01 12/31/2023   Last vitamin B12 and Folate Lab Results  Component Value Date   VITAMINB12 329 10/05/2023        Objective    Wt 189 lb (85.7 kg) Comment: report from patient's home scale  LMP 01/24/2011   BMI 27.12 kg/m   BP Readings from Last 3 Encounters:  04/14/24 114/71  03/17/24 116/78  12/31/23 125/80   Wt Readings from Last 3 Encounters:  05/17/24 189 lb (85.7 kg)  04/14/24 201 lb 14.4 oz (91.6 kg)  03/17/24 203 lb (92.1 kg)        Physical Exam MEASUREMENTS: Weight- 189.     Assessment & Plan     Problem List Items Addressed This Visit     Insomnia   Insomnia Chronic insomnia with previous trials of Lunesta , Tylenol  PM, meditation,  Unisom, and trazodone  with limited success. Lunesta  caused nightmares and headaches. She prefers a medication for occasional use rather than nightly use due to concerns about habit formation. - Prescribed clonazepam 0.5 mg as needed for sleep, to be taken within 30 minutes of bedtime.        Relevant Medications   clonazePAM (KLONOPIN) 0.5 MG  tablet   Type 2 diabetes mellitus without complication, without long-term current use of insulin (HCC) - Primary    Type 2 diabetes mellitus Chronic type 2 diabetes managed with semaglutide . Previous increase to 2 mg resulted in worsening reflux, but symptoms have improved. Weight loss of approximately 4-5 pounds since October. A1c to be checked in mid-December by nephrologist. - continue ozempic  2mg  weekly  - continue farxiga 5mg  dialy  - continue losartan  25mg  daily -needs DM eye exam  - Will check A1c in mid-December with nephrologist.        Assessment & Plan Insomnia Chronic insomnia with previous trials of Lunesta , Tylenol  PM, meditation, Unisom, and trazodone  with limited success. Lunesta  caused nightmares and headaches. She prefers a medication for occasional use rather than nightly use due to concerns about habit formation. - Prescribed clonazepam 0.5 mg as needed for sleep, to be taken within 30 minutes of bedtime.      Return in about 2 months (around 07/17/2024) for Insomnia.     I discussed the assessment and treatment plan with the patient. The patient was provided an opportunity to ask questions and all were answered. The patient agreed with the plan and demonstrated an understanding of the instructions.   The patient was advised to call back or seek an in-person evaluation if the symptoms worsen or if the condition fails to improve as anticipated.  I provided 20 minutes of non-face-to-face time during this encounter.   Rockie Agent, MD [facility] [phone] (phone) [phone]  (fax)  [facility]

## 2024-05-17 NOTE — Assessment & Plan Note (Signed)
  Type 2 diabetes mellitus Chronic type 2 diabetes managed with semaglutide . Previous increase to 2 mg resulted in worsening reflux, but symptoms have improved. Weight loss of approximately 4-5 pounds since October. A1c to be checked in mid-December by nephrologist. - continue ozempic  2mg  weekly  - continue farxiga 5mg  dialy  - continue losartan  25mg  daily -needs DM eye exam  - Will check A1c in mid-December with nephrologist.

## 2024-06-09 LAB — OPHTHALMOLOGY REPORT-SCANNED

## 2024-06-28 ENCOUNTER — Other Ambulatory Visit (HOSPITAL_COMMUNITY): Payer: Self-pay

## 2024-06-28 ENCOUNTER — Encounter: Payer: Self-pay | Admitting: Oncology

## 2024-07-04 ENCOUNTER — Encounter: Payer: Self-pay | Admitting: Oncology

## 2024-07-04 ENCOUNTER — Inpatient Hospital Stay: Attending: Oncology | Admitting: Oncology

## 2024-07-04 VITALS — BP 128/87 | HR 71 | Temp 96.8°F | Resp 18 | Ht 70.0 in | Wt 197.6 lb

## 2024-07-04 DIAGNOSIS — Z5181 Encounter for therapeutic drug level monitoring: Secondary | ICD-10-CM | POA: Diagnosis not present

## 2024-07-04 DIAGNOSIS — Z803 Family history of malignant neoplasm of breast: Secondary | ICD-10-CM | POA: Diagnosis not present

## 2024-07-04 DIAGNOSIS — C50911 Malignant neoplasm of unspecified site of right female breast: Secondary | ICD-10-CM | POA: Insufficient documentation

## 2024-07-04 DIAGNOSIS — Z1732 Human epidermal growth factor receptor 2 negative status: Secondary | ICD-10-CM | POA: Insufficient documentation

## 2024-07-04 DIAGNOSIS — Z08 Encounter for follow-up examination after completed treatment for malignant neoplasm: Secondary | ICD-10-CM

## 2024-07-04 DIAGNOSIS — Z801 Family history of malignant neoplasm of trachea, bronchus and lung: Secondary | ICD-10-CM | POA: Insufficient documentation

## 2024-07-04 DIAGNOSIS — Z79811 Long term (current) use of aromatase inhibitors: Secondary | ICD-10-CM | POA: Insufficient documentation

## 2024-07-04 DIAGNOSIS — Z79899 Other long term (current) drug therapy: Secondary | ICD-10-CM | POA: Insufficient documentation

## 2024-07-04 DIAGNOSIS — Z170 Estrogen receptor positive status [ER+]: Secondary | ICD-10-CM | POA: Insufficient documentation

## 2024-07-04 DIAGNOSIS — Z800 Family history of malignant neoplasm of digestive organs: Secondary | ICD-10-CM | POA: Diagnosis not present

## 2024-07-04 DIAGNOSIS — Z1721 Progesterone receptor positive status: Secondary | ICD-10-CM | POA: Insufficient documentation

## 2024-07-04 DIAGNOSIS — Z923 Personal history of irradiation: Secondary | ICD-10-CM | POA: Insufficient documentation

## 2024-07-04 DIAGNOSIS — Z7984 Long term (current) use of oral hypoglycemic drugs: Secondary | ICD-10-CM | POA: Insufficient documentation

## 2024-07-04 DIAGNOSIS — Z853 Personal history of malignant neoplasm of breast: Secondary | ICD-10-CM | POA: Diagnosis not present

## 2024-07-04 DIAGNOSIS — M85851 Other specified disorders of bone density and structure, right thigh: Secondary | ICD-10-CM | POA: Diagnosis not present

## 2024-07-04 NOTE — Progress Notes (Signed)
 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  This was followed by diagnostic mammogram and ultrasound which showed architectural distortion in the lateral left breast at the 3 o'clock position.  There were a group of cysts measuring 7 x 2 x 3 mm.  Stereotactic biopsy of the architectural distortion was recommended.  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     Final pathology showed 8 mm grade 1 tumor with negative margins. 5 Sentinel lymph nodes negative for malignancy.  Genetic testing negative.patient completed adjuvant radiation and started letrozole  in sept 2023.  She did not require Oncotype testing.  Patient could not tolerate letrozole  and was subsequently switched to Arimidex    Baseline bone density scan does show osteopenia of the AP spine and right femur with a T-score of -2.  10-year probability of a major osteoporotic fracture 29.6% and hip fracture 2.8%.  Unable to tolerate Fosamax  due to reflux.  Bone density scan in April 2025 showed evidence of osteoporosis in the right femur neck with  worsening T-scores and therefore plan was to proceed with Reclast   Interval history-Kathy Farmer is a 69 year old female with hormone receptor-positive breast cancer and osteoporosis who presents for routine oncology follow-up.  She initiated letrozole  therapy for breast cancer in September 2023 but discontinued due to intolerance and transitioned to anastrozole , which she continues without breast-related symptoms. She denies new breast complaints.  She reports generalized pruritus, describing herself as really, really itchy everywhere. She reports that she has been to the skin doctor and is using five topical treatments.  She has osteoporosis with worsening bone density noted last year. She was unable to tolerate Fosamax  due to severe acid reflux and transitioned to annual Reclast  infusions, with her first dose administered in July 2025.      History of Present Illness  ECOG PS- 1 Pain scale- 0   Review of systems- Review of Systems  Constitutional:  Negative for chills, fever, malaise/fatigue and weight loss.  HENT:  Negative for congestion, ear discharge and nosebleeds.   Eyes:  Negative for blurred vision.  Respiratory:  Negative for cough, hemoptysis, sputum production, shortness of breath and wheezing.   Cardiovascular:  Negative for chest pain, palpitations, orthopnea and claudication.  Gastrointestinal:  Negative for abdominal pain, blood in stool, constipation, diarrhea, heartburn, melena, nausea and vomiting.  Genitourinary:  Negative for dysuria, flank pain, frequency, hematuria and urgency.  Musculoskeletal:  Negative for back pain, joint pain and myalgias.  Skin:  Negative for rash.  Neurological:  Negative for dizziness, tingling, focal weakness, seizures, weakness and headaches.  Endo/Heme/Allergies:  Does not bruise/bleed easily.  Psychiatric/Behavioral:  Negative for depression and suicidal ideas. The patient  does not have insomnia.        Allergies[1]   Past Medical History:  Diagnosis Date   Anemia    Breast cancer (HCC) 2023   Closed fracture of glenoid cavity of scapula 07/27/2014   Depression    Dysrhythmia    racing heart- neg workup   GERD (gastroesophageal reflux disease)    History of PCOS    Hypertension    Kidney disease    44%  function   Personal history of radiation therapy    Posterior tibial tendinitis 07/03/2014   Pre-diabetes    Sleep apnea    inconclusive sleep study     Past Surgical History:  Procedure Laterality Date   ABDOMINAL HYSTERECTOMY     AXILLARY SENTINEL NODE BIOPSY Left 12/23/2021   Procedure: AXILLARY SENTINEL NODE BIOPSY;  Surgeon: Dessa Reyes ORN, MD;  Location: [facility] ORS;  Service: General;  Laterality: Left;   BREAST BIOPSY Left 11/18/2021   lt br stereo, distortion, x clip, [facility]   BREAST LUMPECTOMY Left 12/23/2021   [facility]   BREAST LUMPECTOMY WITH NEEDLE LOCALIZATION Left 12/23/2021   Procedure: BREAST LUMPECTOMY WITH NEEDLE LOCALIZATION;  Surgeon: Dessa Reyes ORN, MD;  Location: [facility] ORS;  Service: General;  Laterality: Left;   CESAREAN SECTION     DIAGNOSTIC LAPAROSCOPY     DILATION AND CURETTAGE OF UTERUS  06/30/2009   with hysteroecopy   TUBAL LIGATION Bilateral     Social History   Socioeconomic History   Marital status: Married    Spouse name: Not on file   Number of children: 2   Years of education: Not on file   Highest education level: Master's degree (e.g., MA, MS, MEng, MEd, MSW, MBA)  Occupational History    Employer: [employer]  Tobacco Use   Smoking status: Never   Smokeless tobacco: Never  Vaping Use   Vaping status: Never Used  Substance and Sexual Activity   Alcohol use: Yes    Comment: 2 drinks a month   Drug use: No   Sexual activity: Yes    Partners: Male  Other Topics Concern   Not on file  Social History Narrative   Not on file   Social Drivers of Health   Tobacco Use: Low Risk (07/04/2024)   Patient  History    Smoking Tobacco Use: Never    Smokeless Tobacco Use: Never    Passive Exposure: Not on file  Financial Resource Strain: Low Risk (05/16/2024)   Overall Financial Resource Strain (CARDIA)    Difficulty of Paying Living Expenses: Not hard at all  Food Insecurity: No Food Insecurity (05/16/2024)   Epic    Worried About Programme Researcher, Broadcasting/film/video in the Last Year: Never true    [employer] of Food in the Last Year: Never true  Transportation Needs: Unknown (05/16/2024)   Epic    Lack of Transportation (Medical): No    Lack of Transportation (Non-Medical): Not on file  Physical Activity: Sufficiently Active (05/16/2024)   Exercise Vital Sign    Days of Exercise per Week: 4 days    Minutes of Exercise per Session: 50 min  Stress: No Stress Concern Present (05/16/2024)   [employer] of Occupational Health - Occupational Stress Questionnaire    Feeling of Stress: Only a little  Social Connections: Moderately Integrated (05/16/2024)   Social Connection and Isolation Panel    Frequency of Communication with Friends and Family: Once a week    Frequency of Social Gatherings  with Friends and Family: Once a week    Attends Religious Services: More than 4 times per year    Active Member of Clubs or Organizations: Yes    Attends Banker Meetings: More than 4 times per year    Marital Status: Married  Catering Manager Violence: Not At Risk (10/05/2023)   Humiliation, Afraid, Rape, and Kick questionnaire    Fear of Current or Ex-Partner: No    Emotionally Abused: No    Physically Abused: No    Sexually Abused: No  Depression (PHQ2-9): Low Risk (07/04/2024)   Depression (PHQ2-9)    PHQ-2 Score: 0  Alcohol Screen: Low Risk (05/16/2024)   Alcohol Screen    Last Alcohol Screening Score (AUDIT): 1  Housing: Unknown (05/16/2024)   Epic    Unable to Pay for Housing in the Last Year: No    Number of Times Moved in the Last Year: Not on file    Homeless in the Last Year: No   Utilities: Not At Risk (10/05/2023)   [employer]HC Utilities    Threatened with loss of utilities: No  Health Literacy: Adequate Health Literacy (10/05/2023)   B1300 Health Literacy    Frequency of need for help with medical instructions: Never    Family History  Problem Relation Age of Onset   Breast cancer Mother 73   Osteoporosis Mother    Diabetes Father    Hypertension Father    Cancer Brother        cholangiocarcinoma   Breast cancer Maternal Aunt 78   Pancreatic cancer Maternal Uncle    Lung cancer Maternal Grandfather    Pancreatic cancer Paternal Grandmother    Cancer - Other Paternal Grandmother        cholangiocarcinoma    Current Medications[2]  Physical exam:  Vitals:   07/04/24 1417  BP: 128/87  Pulse: 71  Resp: 18  Temp: (!) 96.8 F (36 C)  TempSrc: Tympanic  SpO2: 100%  Weight: 197 lb 9.6 oz (89.6 kg)  Height: 5' 10 (1.778 m)   Physical Exam Cardiovascular:     Rate and Rhythm: Normal rate and regular rhythm.     Heart sounds: Normal heart sounds.  Pulmonary:     Effort: Pulmonary effort is normal.     Breath sounds: Normal breath sounds.  Skin:    General: Skin is warm and dry.  Neurological:     Mental Status: She is alert and oriented to person, place, and time.    Breast exam was performed in seated and lying down position. Patient is status post left lumpectomy with a well-healed surgical scar. No evidence of any palpable masses. No evidence of axillary adenopathy. No evidence of any palpable masses or lumps in the right breast. No evidence of right axillary adenopathy   I have personally reviewed labs listed below:    Latest Ref Rng & Units 12/31/2023    1:45 PM  CMP  Glucose 70 - 99 mg/dL 858   BUN 8 - 23 mg/dL 17   Creatinine 9.55 - 1.00 mg/dL 8.74   Sodium 864 - 854 mmol/L 136   Potassium 3.5 - 5.1 mmol/L 3.5   Chloride 98 - 111 mmol/L 103   CO2 22 - 32 mmol/L 27   Calcium 8.9 - 10.3 mg/dL 8.8   Total Protein 6.5 - 8.1 g/dL 6.8   Total  Bilirubin 0.0 - 1.2 mg/dL 0.8   Alkaline Phos 38 - 126 U/L 60   AST 15 -  41 U/L 27   ALT 0 - 44 U/L 20       Latest Ref Rng & Units 12/31/2023    1:45 PM  CBC  WBC 4.0 - 10.5 K/uL 4.5   Hemoglobin 12.0 - 15.0 g/dL 87.4   Hematocrit 63.9 - 46.0 % 37.0   Platelets 150 - 400 K/uL 176       Assessment and plan- Patient is a 69 y.o. 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  She is status postlumpectomy and adjuvant radiation therapy.  She is here for a routine follow-up of breast cancer presently on Arimidex   Assessment and Plan    Breast cancer -Clinically patient is doing well with no concerning signs and symptoms of recurrence based on today's exam Managed with anastrozole  after letrozole  intolerance. No breast-related symptoms. Generalized pruritus persists, not linked to anastrozole . Dermatology involved. - Continued anastrozole  (Arimidex ) therapy for 5 years ending in September 2028 - Ordered screening mammogram for April.  Osteoporosis Bone density worsened previously. Fosamax  discontinued due to severe GERD. Reclast  initiated with first dose in July 2025. Plan to continue annual infusions for three years, reassess for drug holiday if bone density stabilizes. - Continue annual Reclast  infusions. - Plan for second Reclast  dose in six months. - Continue Reclast  for three years, then reassess for possible drug holiday based on bone density scan results.         Visit Diagnosis 1. High risk medication use   2. Encounter for follow-up surveillance of breast cancer   3. Osteopenia of neck of right femur   4. Visit for monitoring Arimidex  therapy      Dr. Annah Skene, MD, MPH [facility] [phone] 07/04/2024 2:58 PM                   [1] No Known Allergies [2]  Current Outpatient Medications:    anastrozole  (ARIMIDEX ) 1 MG tablet, TAKE 1  TABLET BY MOUTH DAILY, Disp: 90 tablet, Rfl: 1   CALCIUM-VITAMIN D  PO, Take by mouth., Disp: , Rfl:    citalopram  (CELEXA ) 20 MG tablet, TAKE 1 TABLET BY MOUTH DAILY, Disp: 90 tablet, Rfl: 2   clonazePAM  (KLONOPIN ) 0.5 MG tablet, Take 1 tablet (0.5 mg total) by mouth at bedtime as needed for anxiety., Disp: 20 tablet, Rfl: 1   cyanocobalamin  1000 MCG tablet, Take 1,000 mcg by mouth daily., Disp: , Rfl:    famotidine (PEPCID) 40 MG tablet, Take 1 tablet by mouth at bedtime., Disp: , Rfl:    FARXIGA 5 MG TABS tablet, Take 5 mg by mouth daily., Disp: , Rfl:    hydrochlorothiazide  (HYDRODIURIL ) 12.5 MG tablet, Take 12.5 mg by mouth daily., Disp: , Rfl:    losartan  (COZAAR ) 25 MG tablet, Take 25 mg by mouth daily., Disp: , Rfl:    Semaglutide , 2 MG/DOSE, (OZEMPIC , 2 MG/DOSE,) 8 MG/3ML SOPN, Inject 2 mg into the skin once a week., Disp: 6 mL, Rfl: 3   triamcinolone cream (KENALOG) 0.1 %, Apply topically., Disp: , Rfl:   "

## 2024-07-04 NOTE — Progress Notes (Signed)
 Patient has some concerns to address during today's visit.

## 2024-07-07 ENCOUNTER — Other Ambulatory Visit (HOSPITAL_COMMUNITY): Payer: Self-pay

## 2024-07-07 ENCOUNTER — Telehealth: Payer: Self-pay | Admitting: Pharmacy Technician

## 2024-07-07 NOTE — Telephone Encounter (Signed)
 Pharmacy Patient Advocate Encounter   Received notification from [employer] CMM KEY that prior authorization for Ozempic  (2 MG/DOSE) 8MG /3ML pen-injectors is due for renewal.   Insurance verification completed.   The patient is insured through [place] [place] [employer]  Action: Medication is now available without a prior authorization.

## 2024-07-25 ENCOUNTER — Other Ambulatory Visit: Payer: Self-pay | Admitting: Oncology

## 2024-07-26 ENCOUNTER — Encounter: Payer: Self-pay | Admitting: Oncology

## 2024-08-03 ENCOUNTER — Ambulatory Visit: Admitting: Family Medicine

## 2024-09-01 ENCOUNTER — Ambulatory Visit: Admitting: Family Medicine

## 2024-10-25 ENCOUNTER — Encounter

## 2025-01-02 ENCOUNTER — Inpatient Hospital Stay

## 2025-01-02 ENCOUNTER — Inpatient Hospital Stay: Admitting: Oncology
